# Patient Record
Sex: Male | Born: 1970
Health system: Southern US, Community
[De-identification: ages and names within clinical notes are randomized; demographics above are authoritative.]

## PROBLEM LIST (undated history)

## (undated) DIAGNOSIS — R42 Dizziness and giddiness: Secondary | ICD-10-CM

## (undated) DIAGNOSIS — IMO0001 Reserved for inherently not codable concepts without codable children: Secondary | ICD-10-CM

## (undated) DIAGNOSIS — F329 Major depressive disorder, single episode, unspecified: Secondary | ICD-10-CM

## (undated) DIAGNOSIS — R55 Syncope and collapse: Secondary | ICD-10-CM

## (undated) DIAGNOSIS — R011 Cardiac murmur, unspecified: Secondary | ICD-10-CM

## (undated) DIAGNOSIS — B019 Varicella without complication: Secondary | ICD-10-CM

## (undated) DIAGNOSIS — N200 Calculus of kidney: Secondary | ICD-10-CM

## (undated) DIAGNOSIS — F410 Panic disorder [episodic paroxysmal anxiety] without agoraphobia: Secondary | ICD-10-CM

## (undated) DIAGNOSIS — H9190 Unspecified hearing loss, unspecified ear: Secondary | ICD-10-CM

## (undated) DIAGNOSIS — F32A Depression, unspecified: Secondary | ICD-10-CM

## (undated) DIAGNOSIS — N4340 Spermatocele of epididymis, unspecified: Secondary | ICD-10-CM

## (undated) DIAGNOSIS — I341 Nonrheumatic mitral (valve) prolapse: Secondary | ICD-10-CM

## (undated) DIAGNOSIS — F419 Anxiety disorder, unspecified: Secondary | ICD-10-CM

## (undated) DIAGNOSIS — R000 Tachycardia, unspecified: Secondary | ICD-10-CM

## (undated) DIAGNOSIS — R0609 Other forms of dyspnea: Secondary | ICD-10-CM

## (undated) DIAGNOSIS — E559 Vitamin D deficiency, unspecified: Secondary | ICD-10-CM

## (undated) DIAGNOSIS — D696 Thrombocytopenia, unspecified: Secondary | ICD-10-CM

## (undated) HISTORY — DX: Tachycardia, unspecified: R00.0

## (undated) HISTORY — DX: Vitamin D deficiency, unspecified: E55.9

## (undated) HISTORY — PX: TOOTH EXTRACTION: SUR596

## (undated) HISTORY — DX: Spermatocele of epididymis, unspecified: N43.40

## (undated) HISTORY — DX: Cardiac murmur, unspecified: R01.1

## (undated) HISTORY — DX: Major depressive disorder, single episode, unspecified: F32.9

## (undated) HISTORY — DX: Syncope and collapse: R55

## (undated) HISTORY — DX: Gilbert syndrome: E80.4

## (undated) HISTORY — DX: Panic disorder (episodic paroxysmal anxiety): F41.0

## (undated) HISTORY — DX: Anxiety disorder, unspecified: F41.9

## (undated) HISTORY — DX: Thrombocytopenia, unspecified: D69.6

## (undated) HISTORY — DX: Other forms of dyspnea: R06.09

## (undated) HISTORY — DX: Depression, unspecified: F32.A

## (undated) HISTORY — DX: Unspecified hearing loss, unspecified ear: H91.90

## (undated) HISTORY — DX: Nonrheumatic mitral (valve) prolapse: I34.1

## (undated) HISTORY — DX: Calculus of kidney: N20.0

## (undated) HISTORY — DX: Varicella without complication: B01.9

## (undated) HISTORY — DX: Reserved for inherently not codable concepts without codable children: IMO0001

---
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
## 1995-10-31 HISTORY — PX: SHOULDER SURGERY: SHX246

## 2005-10-30 HISTORY — PX: CHOLECYSTECTOMY: SHX55

## 2011-10-31 DIAGNOSIS — I341 Nonrheumatic mitral (valve) prolapse: Secondary | ICD-10-CM

## 2011-10-31 HISTORY — DX: Nonrheumatic mitral (valve) prolapse: I34.1

## 2012-08-30 DIAGNOSIS — N4340 Spermatocele of epididymis, unspecified: Secondary | ICD-10-CM

## 2012-08-30 HISTORY — DX: Spermatocele of epididymis, unspecified: N43.40

## 2013-10-30 DIAGNOSIS — F410 Panic disorder [episodic paroxysmal anxiety] without agoraphobia: Secondary | ICD-10-CM

## 2013-10-30 HISTORY — DX: Panic disorder (episodic paroxysmal anxiety): F41.0

## 2015-11-10 LAB — TSH: TSH: 0.95 (ref 0.41–5.90)

## 2015-11-10 LAB — HEPATIC FUNCTION PANEL
ALT: 43 — AB (ref 10–40)
AST: 26 (ref 14–40)
Alkaline Phosphatase: 85 (ref 25–125)
Bilirubin, Total: 1.4

## 2015-11-10 LAB — BASIC METABOLIC PANEL
BUN: 19 (ref 4–21)
Creatinine: 0.9 (ref 0.6–1.3)
Glucose: 112
Potassium: 4.2 (ref 3.4–5.3)
SODIUM: 139 (ref 137–147)

## 2015-11-10 LAB — LIPID PANEL
CHOLESTEROL: 163 (ref 0–200)
HDL: 43 (ref 35–70)
LDL CALC: 103
Triglycerides: 87 (ref 40–160)

## 2015-11-10 LAB — CBC AND DIFFERENTIAL
HEMATOCRIT: 45 (ref 41–53)
HEMOGLOBIN: 15.7 (ref 13.5–17.5)
PLATELETS: 211 (ref 150–399)
WBC: 7

## 2015-11-10 LAB — PSA: PSA: 0.46

## 2016-05-05 ENCOUNTER — Ambulatory Visit (INDEPENDENT_AMBULATORY_CARE_PROVIDER_SITE_OTHER): Payer: Managed Care, Other (non HMO) | Admitting: Family Medicine

## 2016-05-05 ENCOUNTER — Encounter: Payer: Self-pay | Admitting: Family Medicine

## 2016-05-05 VITALS — BP 133/91 | HR 90 | Temp 98.1°F | Resp 20 | Ht 74.0 in | Wt 181.8 lb

## 2016-05-05 DIAGNOSIS — F32A Depression, unspecified: Secondary | ICD-10-CM | POA: Insufficient documentation

## 2016-05-05 DIAGNOSIS — F419 Anxiety disorder, unspecified: Secondary | ICD-10-CM

## 2016-05-05 DIAGNOSIS — F329 Major depressive disorder, single episode, unspecified: Secondary | ICD-10-CM

## 2016-05-05 MED ORDER — QUETIAPINE FUMARATE 50 MG PO TABS: ORAL_TABLET | ORAL | Status: DC

## 2016-05-05 NOTE — Progress Notes (Signed)
Patient ID: Chris Robertson, male   DOB: September 07, 1971, 45 y.o.   MRN: [id number]      Patient ID: Chris Robertson, male  DOB: 1971/10/20, 45 y.o.   MRN: [id number] Patient Care Team    Relationship Specialty Notifications Start End  Ma Hillock, DO PCP - General Family Medicine  05/05/16     Subjective:  Chris Robertson is a 45 y.o.  male present for new patient establishment with acute complaint.  All past medical history, surgical history, allergies, family history, immunizations, medications and social history were obtained/entered in the electronic medical record today. All recent labs, ED visits and hospitalizations within the last year were reviewed.  Panic/anxiety/depression: pt states he has a strong family history of mental disease. He was tried on multiple medications over the course of a year with his prior PCP.  With all medications he experienced a side effect he found intollerable. He reports over the weekend he suffered from a panic attack.  He experienced a feeling of being overwhelmed, tearful and sad. He has recently needed to move to [place] from [place] secondary to financial reasons. He and his mother have moved in with his sister. He reports he is the only one in his family that takes care of his mom, which has a chronic illness. He became overwhelmed this weekend when he felt he many tasks to completed around the house, errands to run and care of his mother and he was not getting any help. He states he works at [employer] does not make much money. He would be interested in attempting another medication and therapy, but has concerns over financial cost.  Meds prior: prozac (GERD-like symptoms), zoloft and two others he can not remember caused sleeping issues and night sweats. Patient was seen in the ED (in [place]) about a year ago for anxiety, treated and released (no overnight admission). After that occurrence is when he had been tried on above listed medications. Prior to that  event he had not been treated for anxiety.    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  GAD 7 : Generalized Anxiety Score 05/05/2016  Nervous, Anxious, on Edge 1  Control/stop worrying 1  Worry too much - different things 1  Trouble relaxing 2  Restless 2  Easily annoyed or irritable 1  Afraid - awful might happen 0  Total GAD 7 Score 8  Anxiety Difficulty Somewhat difficult   Mood disorder: negative, only secondary to #2 --> no. 7 yes in #1, and #3 with moderate   Immunization History  Administered Date(s) Administered  . Influenza-Unspecified 07/31/2015  . Tdap 10/30/2013     Past Medical History  Diagnosis Date  . Anxiety   . Depression   . Heart murmur     since childhood  . Chicken pox   . Hearing impaired     age 6  . Panic disorder 2015  . Spermatocele    No Known Allergies Past Surgical History  Procedure Laterality Date  . Cholecystectomy  2007  . Shoulder surgery  1997  . Pectus carnatum repair  1989   Family History  Problem Relation Age of Onset  . Mental illness Mother   .  Mental illness Father   . Mental illness Sister   . Mental illness Brother   . Thyroid cancer Maternal Aunt   . Mental illness Maternal Aunt   . Mental illness Maternal Uncle   . Mental illness Paternal Aunt   . Mental illness Paternal Uncle   . Mental illness Maternal Grandmother   . Pancreatic cancer Maternal Grandfather   . Mental illness Maternal Grandfather   . Mental illness Paternal Grandmother   . Mental illness Paternal Grandfather    Social History   Social History  . Marital Status: Single    Spouse Name: N/A    . Number of Children: N/A  . Years of Education: N/A   Occupational History  . Not on file.   Social History Main Topics  . Smoking status: Never Smoker   . Smokeless tobacco: Not on file  . Alcohol Use: 1.2 oz/week    2 Glasses of wine per week  . Drug Use: No  . Sexual Activity: No   Other Topics Concern  . Not on file   Social History Narrative   Single. No children.    Associates degree. Works full time as Financial trader for [employer].   No tobacco, drug use. Occasional ETOH.    Drink caffeine    Wears seatbelt, bike helmet   Bilateral hearing aids   Smoke detector in the home.    Feels safe in relationships.         Medication List       This list is accurate as of: 05/05/16 12:12 PM.  Always use your most recent med list.               QUEtiapine 50 MG tablet  Commonly known as:  SEROQUEL  50 mg qhs for 5 days, then 100 mg qhs         No results found for this or any previous visit (from the past 2160 hour(s)).   ROS: 14 pt review of systems performed and negative (unless mentioned in an HPI)  Objective: BP 133/91 mmHg  Pulse 90  Temp(Src) 98.1 F (36.7 C)  Resp 20  Ht 6' 2"  (1.88 m)  Wt 181 lb 12.8 oz (82.464 kg)  BMI 23.33 kg/m2  SpO2 96% Gen: Afebrile. No acute distress. Nontoxic in appearance, well-developed, well-nourished,  Caucasian male. HOH. HENT: AT. [place]. MMM, no oral lesions. Eyes:Pupils Equal Round Reactive to light, Extraocular movements intact,  Conjunctiva without redness, discharge or icterus. Neck/lymp/endocrine: Supple,no lymphadenopathy, no thyromegaly CV: RRR 2/6 murmur present (radiation to back), no edema, +2/4 P  Chest: CTAB, no wheeze, rhonchi or crackles.  Abd: Soft. NTND. BS present.  Skin: Warm and well-perfused. Skin intact. Neuro/Msk: Normal gait. PERLA. EOMi. Alert. Oriented x3.   Psych: Anxious, otherwise Normal affect, dress and demeanor. Normal speech. Normal thought content and  judgment.  Assessment/plan: Chris Robertson is a 45 y.o. male present for new pt establishment with acute complaint.  Anxiety disorder, unspecified anxiety disorder type - long discussion concerning treatment considerations. Trial of seroquel, taper to 100 mg QHS, follow up in 4 weeks.  - Therapy: pt to investigate outpatient therapy. Will also provide resources information on clinic in the area.  - QUEtiapine (SEROQUEL) 50 MG tablet; 50 mg qhs for 5 days, then 100 mg qhs  Dispense: 60 tablet; Refill: 1  Greater than 45 minutes was spent with patient, greater than 50% of that time was spent face-to-face with patient counseling and coordinating care.  Electronically  signed by: Howard Pouch, DO [place]

## 2016-05-05 NOTE — Patient Instructions (Signed)
It was a pleasure meeting you today.  I have called in a medicine for you to start 1-2 hours prior to bed. You will take 1 pill for 5 days, then increase to 2 pills.  F/U 3-4 weeks.

## 2016-05-12 ENCOUNTER — Encounter: Payer: Self-pay | Admitting: Family Medicine

## 2016-05-12 DIAGNOSIS — N433 Hydrocele, unspecified: Secondary | ICD-10-CM | POA: Insufficient documentation

## 2016-05-23 DIAGNOSIS — H905 Unspecified sensorineural hearing loss: Secondary | ICD-10-CM | POA: Insufficient documentation

## 2016-05-25 ENCOUNTER — Encounter: Payer: Self-pay | Admitting: Family Medicine

## 2016-05-30 ENCOUNTER — Ambulatory Visit (INDEPENDENT_AMBULATORY_CARE_PROVIDER_SITE_OTHER): Payer: Managed Care, Other (non HMO) | Admitting: Family Medicine

## 2016-05-30 ENCOUNTER — Encounter: Payer: Self-pay | Admitting: Family Medicine

## 2016-05-30 DIAGNOSIS — F419 Anxiety disorder, unspecified: Secondary | ICD-10-CM | POA: Diagnosis not present

## 2016-05-30 MED ORDER — QUETIAPINE FUMARATE 100 MG PO TABS: 100.0000 mg | ORAL_TABLET | Freq: Every day | ORAL | 1 refills | Status: DC

## 2016-05-30 NOTE — Progress Notes (Signed)
Patient ID: Chris Robertson, male   DOB: 02-06-1971, 45 y.o.   MRN: [id number]      Patient ID: Chris Robertson, male  DOB: 04-12-1971, 45 y.o.   MRN: [id number] Patient Care Team    Relationship Specialty Notifications Start End  Ma Hillock, DO PCP - General Family Medicine  05/05/16     Subjective:  Qu[place] Chris Robertson is a 45 y.o.  male present for follow up on anxiety and depression.   Panic/anxiety/depression: Pt was started on Seroquel taper ~4 weeks, and is now taking 100 mg QHS. He feels more relaxed and in better control of anxiety. He is sleeping better, but still not a full night sleep. He does not feel any side effects to the medications. He would like to stay on the 100 mg dose.   Prior note: pt states he has a strong family history of mental disease. He was tried on multiple medications over the course of a year with his prior PCP.  With all medications he experienced a side effect he found intollerable. He reports over the weekend he suffered from a panic attack.  He experienced a feeling of being overwhelmed, tearful and sad. He has recently needed to move to [place] from [place] secondary to financial reasons. He and his mother have moved in with his sister. He reports he is the only one in his family that takes care of his mom, which has a chronic illness. He became overwhelmed this weekend when he felt he many tasks to completed around the house, errands to run and care of his mother and he was not getting any help. He states he works at [employer] does not make much money. He would be interested in attempting another medication and therapy, but has concerns over financial cost.  Meds prior: prozac (GERD-like symptoms), zoloft and two others he can not remember caused sleeping issues and night sweats. Patient was seen in the ED (in [place]) about a year ago for anxiety, treated and released (no overnight admission). After that occurrence is when he had been tried on above listed  medications. Prior to that event he had not been treated for anxiety.   GAD 7 : Generalized Anxiety Score 05/05/2016  Nervous, Anxious, on Edge 1  Control/stop worrying 1  Worry too much - different things 1  Trouble relaxing 2  Restless 2  Easily annoyed or irritable 1  Afraid - awful might happen 0  Total GAD 7 Score 8  Anxiety Difficulty Somewhat difficult   Mood disorder: negative, only secondary to #2 --> no. 7 yes in #1, and #3 with moderate   Immunization History  Administered Date(s) Administered  . Influenza-Unspecified 07/31/2015  . Tdap 10/30/2013     Past Medical History:  Diagnosis Date  . Anxiety   . Aortic regurgitation   . Chicken pox   . Depression   . Gilbert disease   . Hearing impaired    age 49  . Heart murmur    since childhood  . Mitral regurgitation   . MVP (mitral valve prolapse)   . Panic disorder 2015  . Spermatocele   . Thrombocytopenia (HCC)    mild, chronic  . Vitamin D deficiency    No Known Allergies Past Surgical History:  Procedure Laterality Date  . CHOLECYSTECTOMY  2007  . [place]  . SHOULDER SURGERY  1997   Family History  Problem Relation Age of Onset  . Mental illness Mother   .  Mental illness Father   . Mental illness Sister   . Mental illness Brother   . Thyroid cancer Maternal Aunt   . Mental illness Maternal Aunt   . Mental illness Maternal Uncle   . Mental illness Paternal Aunt   . Mental illness Paternal Uncle   . Mental illness Maternal Grandmother   . Pancreatic cancer Maternal Grandfather   . Mental illness Maternal Grandfather   . Mental illness Paternal Grandmother   . Mental illness Paternal Grandfather    Social History   Social History  . Marital status: Single    Spouse name: N/A  . Number of children: N/A  . Years of education: N/A   Occupational History  . Not on file.   Social History Main Topics  . Smoking status: Never Smoker  . Smokeless tobacco: Never Used  .  Alcohol use 1.2 oz/week    2 Glasses of wine per week  . Drug use: No  . Sexual activity: No   Other Topics Concern  . Not on file   Social History Narrative   Single. No children.    Associates degree. Works full time as Financial trader for [employer].   No tobacco, drug use. Occasional ETOH.    Drink caffeine    Wears seatbelt, bike helmet   Bilateral hearing aids   Smoke detector in the home.    Feels safe in relationships.         Medication List       Accurate as of 05/30/16 10:36 AM. Always use your most recent med list.          QUEtiapine 50 MG tablet Commonly known as:  SEROQUEL 50 mg qhs for 5 days, then 100 mg qhs        No results found for this or any previous visit (from the past 2160 hour(s)).   ROS: 14 pt review of systems performed and negative (unless mentioned in an HPI)  Objective: BP 117/79 (BP Location: Right Arm, Patient Position: Sitting, Cuff Size: Normal)   Pulse 89   Temp 97.9 F (36.6 C) (Oral)   Resp 20   Ht 6' 2"  (1.88 m)   Wt 178 lb 4 oz (80.9 kg)   SpO2 97%   BMI 22.89 kg/m  Gen: Afebrile. No acute distress. Nontoxic in appearance, well-developed, well-nourished,  Caucasian male. HOH.   Psych: Normal affect, dress and demeanor. Normal speech. Normal thought content and judgment.  Assessment/plan: Chris Robertson is a 45 y.o. male present for new pt establishment with acute complaint.  Anxiety disorder, unspecified anxiety disorder type - doing rather well on medication - refilled Seroquel 100 mg qhs.  - f/U 3 months.    Electronically signed by: Howard Pouch, DO [place]

## 2016-05-30 NOTE — Patient Instructions (Signed)
I am glad you are doing well on medication.  Continue 100 mg (1 tab now), before bed. 90 day refills called in.  I will see you in 3 months (sooner if needed).

## 2016-05-31 ENCOUNTER — Encounter: Payer: Self-pay | Admitting: Family Medicine

## 2016-06-05 ENCOUNTER — Encounter: Payer: Self-pay | Admitting: Family Medicine

## 2016-07-18 ENCOUNTER — Encounter: Payer: Self-pay | Admitting: Family Medicine

## 2016-08-30 ENCOUNTER — Ambulatory Visit: Payer: Managed Care, Other (non HMO) | Admitting: Family Medicine

## 2016-10-02 ENCOUNTER — Encounter: Payer: Self-pay | Admitting: Family Medicine

## 2016-10-13 ENCOUNTER — Ambulatory Visit (INDEPENDENT_AMBULATORY_CARE_PROVIDER_SITE_OTHER): Payer: Self-pay

## 2016-10-13 DIAGNOSIS — Z23 Encounter for immunization: Secondary | ICD-10-CM

## 2016-11-08 ENCOUNTER — Encounter: Payer: Self-pay | Admitting: Family Medicine

## 2016-12-11 ENCOUNTER — Other Ambulatory Visit: Payer: Self-pay | Admitting: Family Medicine

## 2016-12-11 DIAGNOSIS — F419 Anxiety disorder, unspecified: Secondary | ICD-10-CM

## 2016-12-12 ENCOUNTER — Encounter: Payer: Self-pay | Admitting: *Deleted

## 2016-12-12 NOTE — Telephone Encounter (Signed)
seroquel refilled 30 day supply patient needs office visit prior to any more refills. Sent message to patient in My Chart.

## 2016-12-20 ENCOUNTER — Ambulatory Visit (INDEPENDENT_AMBULATORY_CARE_PROVIDER_SITE_OTHER): Payer: BLUE CROSS/BLUE SHIELD | Admitting: Family Medicine

## 2016-12-20 ENCOUNTER — Encounter: Payer: Self-pay | Admitting: Family Medicine

## 2016-12-20 VITALS — BP 131/87 | HR 73 | Temp 98.4°F | Resp 20 | Ht 74.0 in | Wt 178.0 lb

## 2016-12-20 DIAGNOSIS — F339 Major depressive disorder, recurrent, unspecified: Secondary | ICD-10-CM | POA: Diagnosis not present

## 2016-12-20 DIAGNOSIS — F4001 Agoraphobia with panic disorder: Secondary | ICD-10-CM

## 2016-12-20 MED ORDER — QUETIAPINE FUMARATE 100 MG PO TABS: 100.0000 mg | ORAL_TABLET | Freq: Every day | ORAL | 1 refills | Status: DC

## 2016-12-20 NOTE — Patient Instructions (Signed)
You look really good.  Try to join a group (of whatever you have interest). Try to call the [facility] they do offer some discounted to free counseling, and you may qualify.    Follow every 6 months

## 2016-12-20 NOTE — Progress Notes (Signed)
Patient ID: Chris Robertson, male   DOB: 12-30-1970, 46 y.o.   MRN: [id number]      Patient ID: Chris Robertson, male  DOB: March 31, 1971, 46 y.o.   MRN: [id number] Patient Care Team    Relationship Specialty Notifications Start End  Ma Hillock, DO PCP - General Family Medicine  05/05/16     Subjective:  Chris Robertson is a 46 y.o.  male present for follow up on anxiety and depression.   Panic/anxiety/depression: Pt reports he is doing well on the Seroquel 100 mg QHS. He does notice a little difficulty waking up and getting going in the mornings since he has to get up for work at [employer]. He takes the medicine at 9 pm at night. He still has had issues being more social. He reports he is willing but financially unable to seek counseling. He had once smaller panic attack over the weekend, but his mother was ill and having issues which gave him increased anxiety.   Prior note:  Pt was started on Seroquel taper ~4 weeks, and is now taking 100 mg QHS. He feels more relaxed and in better control of anxiety. He is sleeping better, but still not a full night sleep. He does not feel any side effects to the medications. He would like to stay on the 100 mg dose.   Prior note: pt states he has a strong family history of mental disease. He was tried on multiple medications over the course of a year with his prior PCP.  With all medications he experienced a side effect he found intollerable. He reports over the weekend he suffered from a panic attack.  He experienced a feeling of being overwhelmed, tearful and sad. He has recently needed to move to [place] from [place] secondary to financial reasons. He and his mother have moved in with his sister. He reports he is the only one in his family that takes care of his mom, which has a chronic illness. He became overwhelmed this weekend when he felt he many tasks to completed around the house, errands to run and care of his mother and he was not getting any help. He states he  works at [employer] does not make much money. He would be interested in attempting another medication and therapy, but has concerns over financial cost.  Meds prior: prozac (GERD-like symptoms), zoloft and two others he can not remember caused sleeping issues and night sweats. Patient was seen in the ED (in [place]) about a year ago for anxiety, treated and released (no overnight admission). After that occurrence is when he had been tried on above listed medications. Prior to that event he had not been treated for anxiety.   GAD 7 : Generalized Anxiety Score 05/05/2016  Nervous, Anxious, on Edge 1  Control/stop worrying 1  Worry too much - different things 1  Trouble relaxing 2  Restless 2  Easily annoyed or irritable 1  Afraid - awful might happen 0  Total GAD 7 Score 8  Anxiety Difficulty Somewhat difficult   Mood disorder: negative, only secondary to #2 --> no. 7 yes in #1, and #3 with moderate   Immunization History  Administered Date(s) Administered  . Influenza,inj,Quad PF,36+ Mos 10/13/2016  . Influenza-Unspecified 07/31/2015  . Tdap 10/30/2013     Past Medical History:  Diagnosis Date  . Anxiety   . Aortic regurgitation   . Chicken pox   . Depression   . Gilbert disease   . Hearing impaired  age 75  . Heart murmur    since childhood  . Mitral regurgitation   . MVP (mitral valve prolapse)   . Panic disorder 2015  . Spermatocele   . Thrombocytopenia (HCC)    mild, chronic  . Vitamin D deficiency    No Known Allergies Past Surgical History:  Procedure Laterality Date  . CHOLECYSTECTOMY  2007  . [place]  . SHOULDER SURGERY  1997   Family History  Problem Relation Age of Onset  . Mental illness Mother   . Mental illness Father   . Mental illness Sister   . Mental illness Brother   . Thyroid cancer Maternal Aunt   . Mental illness Maternal Aunt   . Mental illness Maternal Uncle   . Mental illness Paternal Aunt   . Mental illness  Paternal Uncle   . Mental illness Maternal Grandmother   . Pancreatic cancer Maternal Grandfather   . Mental illness Maternal Grandfather   . Mental illness Paternal Grandmother   . Mental illness Paternal Grandfather    Social History   Social History  . Marital status: Single    Spouse name: N/A  . Number of children: N/A  . Years of education: N/A   Occupational History  . Not on file.   Social History Main Topics  . Smoking status: Never Smoker  . Smokeless tobacco: Never Used  . Alcohol use 1.2 oz/week    2 Glasses of wine per week  . Drug use: No  . Sexual activity: No   Other Topics Concern  . Not on file   Social History Narrative   Single. No children.    Associates degree. Works full time as Financial trader for [employer].   No tobacco, drug use. Occasional ETOH.    Drink caffeine    Wears seatbelt, bike helmet   Bilateral hearing aids   Smoke detector in the home.    Feels safe in relationships.       Allergies as of 12/20/2016   No Known Allergies     Medication List       Accurate as of 12/20/16  3:47 PM. Always use your most recent med list.          QUEtiapine 100 MG tablet Commonly known as:  SEROQUEL TAKE 1 TABLET BY MOUTH AT BEDTIME   Vitamin D3 1000 units Caps Take 2,000 Int'l Units by mouth daily.        No results found for this or any previous visit (from the past 2160 hour(s)).   ROS: 14 pt review of systems performed and negative (unless mentioned in an HPI)  Objective: BP 131/87 (BP Location: Left Arm, Patient Position: Sitting, Cuff Size: Large)   Pulse 73   Temp 98.4 F (36.9 C)   Resp 20   Ht 6' 2"  (1.88 m)   Wt 178 lb (80.7 kg)   SpO2 98%   BMI 22.85 kg/m  Gen: Afebrile. No acute distress. Very pleasant caucasian male.  HENT: AT. [place].  MMM.  Eyes:Pupils Equal Round Reactive to light, Extraocular movements intact,  Conjunctiva without redness, discharge or icterus. CV: RRR Chest: CTAB, no wheeze or crackles Psych:  Normal affect, dress and demeanor. Normal speech. Normal thought content and judgment.  Assessment/plan: Chris Robertson is a 46 y.o. male present for new pt establishment with acute complaint.  Anxiety disorder, unspecified anxiety disorder type - stable today. recommended her try to take the medication at 8 pm, and then  continue to back up the time until he finds a happy medium between onset/bedtime and waking more refreshed.  - pamphlet for [facility] to call to see if they have any free services/thrapoy sessions he can join to help him socialize.  - refilled Seroquel 100 mg qhs.  - f/U 6 months.    Electronically signed by: Howard Pouch, DO [place]

## 2017-02-25 ENCOUNTER — Encounter: Payer: Self-pay | Admitting: Family Medicine

## 2017-03-05 ENCOUNTER — Ambulatory Visit (INDEPENDENT_AMBULATORY_CARE_PROVIDER_SITE_OTHER): Payer: BLUE CROSS/BLUE SHIELD | Admitting: Internal Medicine

## 2017-03-05 ENCOUNTER — Encounter: Payer: Self-pay | Admitting: Internal Medicine

## 2017-03-05 VITALS — BP 120/64 | HR 84 | Temp 98.1°F | Wt 185.0 lb

## 2017-03-05 DIAGNOSIS — W57XXXA Bitten or stung by nonvenomous insect and other nonvenomous arthropods, initial encounter: Secondary | ICD-10-CM

## 2017-03-05 DIAGNOSIS — L03115 Cellulitis of right lower limb: Secondary | ICD-10-CM

## 2017-03-05 DIAGNOSIS — S70361A Insect bite (nonvenomous), right thigh, initial encounter: Secondary | ICD-10-CM

## 2017-03-05 MED ORDER — DOXYCYCLINE HYCLATE 100 MG PO TABS: 100.0000 mg | ORAL_TABLET | Freq: Two times a day (BID) | ORAL | 0 refills | Status: DC

## 2017-03-05 NOTE — Progress Notes (Signed)
Pre visit review using our clinic review tool, if applicable. No additional management support is needed unless otherwise documented below in the visit note. 

## 2017-03-05 NOTE — Patient Instructions (Signed)
Cellulitis, Adult Cellulitis is a skin infection. The infected area is usually red and sore. This condition occurs most often in the arms and lower legs. It is very important to get treated for this condition. Follow these instructions at home:  Take over-the-counter and prescription medicines only as told by your doctor.  If you were prescribed an antibiotic medicine, take it as told by your doctor. Do not stop taking the antibiotic even if you start to feel better.  Drink enough fluid to keep your pee (urine) clear or pale yellow.  Do not touch or rub the infected area.  Raise (elevate) the infected area above the level of your heart while you are sitting or lying down.  Place warm or cold wet cloths (warm or cold compresses) on the infected area. Do this as told by your doctor.  Keep all follow-up visits as told by your doctor. This is important. These visits let your doctor make sure your infection is not getting worse. Contact a doctor if:  You have a fever.  Your symptoms do not get better after 1-2 days of treatment.  Your bone or joint under the infected area starts to hurt after the skin has healed.  Your infection comes back. This can happen in the same area or another area.  You have a swollen bump in the infected area.  You have new symptoms.  You feel ill and also have muscle aches and pains. Get help right away if:  Your symptoms get worse.  You feel very sleepy.  You throw up (vomit) or have watery poop (diarrhea) for a long time.  There are red streaks coming from the infected area.  Your red area gets larger.  Your red area turns darker. This information is not intended to replace advice given to you by your health care provider. Make sure you discuss any questions you have with your health care provider. Document Released: 04/03/2008 Document Revised: 03/23/2016 Document Reviewed: 08/25/2015 Elsevier Interactive Patient Education  2017 [employer].

## 2017-03-05 NOTE — Progress Notes (Signed)
Subjective:    Patient ID: Chris Robertson, male    DOB: 01-06-1971, 46 y.o.   MRN: [id number]  HPI  Pt presents to the clinic today with c/o a tick bite of his right leg. He pulled it off last Wednesday. He thinks it was on there < 24 hours. The tick was not engorged. Since pulling it off, he has noticed redness and swelling around the area. The area is itchy and not painful. He denies fever, chills or body aches. He has put Neosporin on the area, but has not noticed that it made any difference.  Review of Systems      Past Medical History:  Diagnosis Date  . Anxiety   . Aortic regurgitation   . Chicken pox   . Depression   . Gilbert disease   . Hearing impaired    age 32  . Heart murmur    since childhood  . Mitral regurgitation   . MVP (mitral valve prolapse)   . Panic disorder 2015  . Spermatocele   . Thrombocytopenia (HCC)    mild, chronic  . Vitamin D deficiency     Current Outpatient Prescriptions  Medication Sig Dispense Refill  . Cholecalciferol (VITAMIN D3) 1000 units CAPS Take 2,000 Int'l Units by mouth daily.    . QUEtiapine (SEROQUEL) 100 MG tablet Take 1 tablet (100 mg total) by mouth at bedtime. 90 tablet 1   No current facility-administered medications for this visit.     No Known Allergies  Family History  Problem Relation Age of Onset  . Mental illness Mother   . Mental illness Father   . Mental illness Sister   . Mental illness Brother   . Thyroid cancer Maternal Aunt   . Mental illness Maternal Aunt   . Mental illness Maternal Uncle   . Mental illness Paternal Aunt   . Mental illness Paternal Uncle   . Mental illness Maternal Grandmother   . Pancreatic cancer Maternal Grandfather   . Mental illness Maternal Grandfather   . Mental illness Paternal Grandmother   . Mental illness Paternal Grandfather     Social History   Social History  . Marital status: Single    Spouse name: N/A  . Number of children: N/A  . Years of education: N/A    Occupational History  . Not on file.   Social History Main Topics  . Smoking status: Never Smoker  . Smokeless tobacco: Never Used  . Alcohol use 1.2 oz/week    2 Glasses of wine per week  . Drug use: No  . Sexual activity: No   Other Topics Concern  . Not on file   Social History Narrative   Single. No children.    Associates degree. Works full time as Teacher, early years/pre for [employer].   No tobacco, drug use. Occasional ETOH.    Drink caffeine    Wears seatbelt, bike helmet   Bilateral hearing aids   Smoke detector in the home.    Feels safe in relationships.         Constitutional: Denies fever, malaise, fatigue, headache or abrupt weight changes.  Skin: Pt reports tick bite of right thigh. Denies ulcercations.    No other specific complaints in a complete review of systems (except as listed in HPI above).  Objective:   Physical Exam   BP 120/64 (BP Location: Left Arm, Patient Position: Sitting)   Pulse 84   Temp 98.1 F (36.7 C) (Oral)   Wt 185 lb (  83.9 kg)   SpO2 98%   BMI 23.75 kg/m  Wt Readings from Last 3 Encounters:  03/05/17 185 lb (83.9 kg)  12/20/16 178 lb (80.7 kg)  05/30/16 178 lb 4 oz (80.9 kg)    General: Appears his stated age, in NAD. Skin: 2 cm oval area of cellulitis noted of right lateral thigh.    Assessment & Plan:   Cellulitis of Right Thigh secondary to Tick Bite:  eRx for Doxycycline 100 mg BID x 10 days Can continue Neosporin or try Hydrocortisone for itching Warm compresses TID  RTC as needed or if symptoms persist or worsen Elizah Mierzwa, NP

## 2017-06-19 ENCOUNTER — Encounter: Payer: Self-pay | Admitting: Family Medicine

## 2017-06-19 ENCOUNTER — Ambulatory Visit (INDEPENDENT_AMBULATORY_CARE_PROVIDER_SITE_OTHER): Payer: BLUE CROSS/BLUE SHIELD | Admitting: Family Medicine

## 2017-06-19 VITALS — BP 127/82 | HR 78 | Temp 98.3°F | Resp 20 | Ht 74.0 in | Wt 184.2 lb

## 2017-06-19 DIAGNOSIS — F339 Major depressive disorder, recurrent, unspecified: Secondary | ICD-10-CM

## 2017-06-19 DIAGNOSIS — F4001 Agoraphobia with panic disorder: Secondary | ICD-10-CM

## 2017-06-19 MED ORDER — QUETIAPINE FUMARATE 100 MG PO TABS: 100.0000 mg | ORAL_TABLET | Freq: Every day | ORAL | 1 refills | Status: DC

## 2017-06-19 NOTE — Progress Notes (Signed)
Patient ID: Jareth Pardee, male   DOB: [birth year], 46 y.o.   MRN: [id number]      Patient ID: Partick Musselman, male  DOB: 20-Jan-1971, 46 y.o.   MRN: [id number] Patient Care Team    Relationship Specialty Notifications Start End  Ma Hillock, DO PCP - General Family Medicine  05/05/16     Subjective:  Kalieb Freeland is a 46 y.o.  male present for follow up on anxiety and depression.   Panic/anxiety/depression:  Patient presents for follow-up on his anxiety and depression today. He states he is doing well on the Seroquel 100 mg daily at bedtime. He is taking this medication around 7:53 PM at night. He still having some difficulty falling asleep, sometimes not able to fall asleep until 11 PM. He does have to get up early for his job at 5 AM, so he has concerns about any additional medications. Overall he feels he is doing well on this medication. He is attempting become more social, and exercise more. He just finished some summer classes, and is registered for fall classes to further his education. He still works full-time and takes care of his mother as well.   Depression screen [facility] 2/9 06/19/2017 05/05/2016 05/05/2016  Decreased Interest 1 1 2   Down, Depressed, Hopeless 1 1 2   PHQ - 2 Score 2 2 4   Altered sleeping 2 - 2  Tired, decreased energy 1 - 2  Change in appetite 0 - 0  Feeling bad or failure about yourself  0 - 0  Trouble concentrating 1 - 0  Moving slowly or fidgety/restless 1 - 0  Suicidal thoughts 0 - 0  PHQ-9 Score 7 - 8  Difficult doing work/chores Somewhat difficult - Somewhat difficult     GAD 7 : Generalized Anxiety Score 06/19/2017 05/05/2016  Nervous, Anxious, on Edge 1 1  Control/stop worrying 1 1  Worry too much - different things 1 1  Trouble relaxing 2 2  Restless 2 2  Easily annoyed or irritable 1 1  Afraid - awful might happen 0 0  Total GAD 7 Score 8 8  Anxiety Difficulty Somewhat difficult Somewhat difficult   Mood disorder: negative, only secondary to #2 -->  no. 7 yes in #1, and #3 with moderate   Immunization History  Administered Date(s) Administered  . Influenza,inj,Quad PF,6+ Mos 10/13/2016  . Influenza-Unspecified 07/31/2015  . Tdap 10/30/2013     Past Medical History:  Diagnosis Date  . Anxiety   . Aortic regurgitation   . Chicken pox   . Depression   . Gilbert disease   . Hearing impaired    age 39  . Heart murmur    since childhood  . Mitral regurgitation   . MVP (mitral valve prolapse)   . Panic disorder 2015  . Spermatocele   . Thrombocytopenia (HCC)    mild, chronic  . Vitamin D deficiency    No Known Allergies Past Surgical History:  Procedure Laterality Date  . CHOLECYSTECTOMY  2007  . [place]  . SHOULDER SURGERY  1997   Family History  Problem Relation Age of Onset  . Mental illness Mother   . Mental illness Father   . Mental illness Sister   . Mental illness Brother   . Thyroid cancer Maternal Aunt   . Mental illness Maternal Aunt   . Mental illness Maternal Uncle   . Mental illness Paternal Aunt   . Mental illness Paternal Uncle   . Mental illness  Maternal Grandmother   . Pancreatic cancer Maternal Grandfather   . Mental illness Maternal Grandfather   . Mental illness Paternal Grandmother   . Mental illness Paternal Grandfather    Social History   Social History  . Marital status: Single    Spouse name: N/A  . Number of children: N/A  . Years of education: N/A   Occupational History  . Not on file.   Social History Main Topics  . Smoking status: Never Smoker  . Smokeless tobacco: Never Used  . Alcohol use 1.2 oz/week    2 Glasses of wine per week  . Drug use: No  . Sexual activity: No   Other Topics Concern  . Not on file   Social History Narrative   Single. No children.    Associates degree. Works full time as Financial trader for [employer].   No tobacco, drug use. Occasional ETOH.    Drink caffeine    Wears seatbelt, bike helmet   Bilateral hearing aids    Smoke detector in the home.    Feels safe in relationships.       Allergies as of 06/19/2017   No Known Allergies     Medication List       Accurate as of 06/19/17 11:22 AM. Always use your most recent med list.          QUEtiapine 100 MG tablet Commonly known as:  SEROQUEL Take 1 tablet (100 mg total) by mouth at bedtime.   Vitamin D3 1000 units Caps Take 2,000 Int'l Units by mouth daily.        No results found for this or any previous visit (from the past 2160 hour(s)).   ROS: 14 pt review of systems performed and negative (unless mentioned in an HPI)  Objective: BP 127/82 (BP Location: Right Arm, Patient Position: Sitting, Cuff Size: Normal)   Pulse 78   Temp 98.3 F (36.8 C)   Resp 20   Ht 6' 2"  (1.88 m)   Wt 184 lb 4 oz (83.6 kg)   SpO2 100%   BMI 23.66 kg/m  Gen: Afebrile. No acute distress. Very pleasant Caucasian male. HENT: AT. [place].  MMM.  Eyes:Pupils Equal Round Reactive to light, Extraocular movements intact,  Conjunctiva without redness, discharge or icterus. Psych: Normal affect, dress and demeanor. Normal speech. Normal thought content and judgment.  Assessment/plan: Mary Hockey is a 46 y.o. male present for new pt establishment with acute complaint.  Depression/Anxiety disorder, unspecified anxiety disorder type - stable today, doing rather well. Refills on Seroquel 100 mg daily at bedtime for 6 months.  - Encouraged him to try to get out and exercise more, this will help with depression and anxiety as well. Also may help him fall asleep quicker at night. - He is working on becoming more social, trying to get himself out into the dating scene. - Follow-up 6 months, sooner if needed.  Electronically signed by: Howard Pouch, DO [place]

## 2017-06-19 NOTE — Patient Instructions (Signed)
You are doing well.  I have refilled your medications today for you.  Schedule your physical in about 6 months and as long as doing well will refill your medications at that time as well.    Please help [place] help you:  We are honored you have chosen [facility] for your Primary Care home. Below you will find basic instructions that you may need to access in the future. Please help [place] help you by reading the instructions, which cover many of the frequent questions we experience.   Prescription refills and request:  -In order to allow more efficient response time, please call your pharmacy for all refills. They will forward the request electronically to [place]. This allows for the quickest possible response. Request left on a nurse line can take longer to refill, since these are checked as time allows between office patients and other phone calls.  - refill request can take up to 3-5 working days to complete.  - If request is sent electronically and request is appropiate, it is usually completed in 1-2 business days.  - all patients will need to be seen routinely for all chronic medical conditions requiring prescription medications (see follow-up below). If you are overdue for follow up on your condition, you will be asked to make an appointment and we will call in enough medication to cover you until your appointment (up to 30 days).  - all controlled substances will require a face to face visit to request/refill.  - if you desire your prescriptions to go through a new pharmacy, and have an active script at original pharmacy, you will need to call your pharmacy and have scripts transferred to new pharmacy. This is completed between the pharmacy locations and not by your provider.    Results: If any images or labs were ordered, it can take up to 1 week to get results depending on the test ordered and the lab/facility running and resulting the test. - Normal or stable results, which do not need  further discussion, may be released to your mychart immediately with attached note to you. A call may not be generated for normal results. Please make certain to sign up for mychart. If you have questions on how to activate your mychart you can call the front office.  - If your results need further discussion, our office will attempt to contact you via phone, and if unable to reach you after 2 attempts, we will release your abnormal result to your mychart with instructions.  - All results will be automatically released in mychart after 1 week.  - Your provider will provide you with explanation and instruction on all relevant material in your results. Please keep in mind, results and labs may appear confusing or abnormal to the untrained eye, but it does not mean they are actually abnormal for you personally. If you have any questions about your results that are not covered, or you desire more detailed explanation than what was provided, you should make an appointment with your provider to do so.   Our office handles many outgoing and incoming calls daily. If we have not contacted you within 1 week about your results, please check your mychart to see if there is a message first and if not, then contact our office.  In helping with this matter, you help decrease call volume, and therefore allow [place] to be able to respond to patients needs more efficiently.   Acute office visits (sick visit):  An acute  visit is intended for a new problem and are scheduled in shorter time slots to allow schedule openings for patients with new problems. This is the appropriate visit to discuss a new problem. In order to provide you with excellent quality medical care with proper time for you to explain your problem, have an exam and receive treatment with instructions, these appointments should be limited to one new problem per visit. If you experience a new problem, in which you desire to be addressed, please make an acute office  visit, we save openings on the schedule to accommodate you. Please do not save your new problem for any other type of visit, let us take care of it properly and quickly for you.   Follow up visits:  Depending on your condition(s) your provider will need to see you routinely in order to provide you with quality care and prescribe medication(s). Most chronic conditions (Example: hypertension, Diabetes, depression/anxiety... etc), require visits a couple times a year. Your provider will instruct you on proper follow up for your personal medical conditions and history. Please make certain to make follow up appointments for your condition as instructed. Failing to do so could result in lapse in your medication treatment/refills. If you request a refill, and are overdue to be seen on a condition, we will always provide you with a 30 day script (once) to allow you time to schedule.    Medicare wellness (well visit): - we have a wonderful Nurse Maudie Mercury), that will meet with you and provide you will yearly medicare wellness visits. These visits should occur yearly (can not be scheduled less than 1 calendar year apart) and cover preventive health, immunizations, advance directives and screenings you are entitled to yearly through your medicare benefits. Do not miss out on your entitled benefits, this is when medicare will pay for these benefits to be ordered for you.  These are strongly encouraged by your provider and is the appropriate type of visit to make certain you are up to date with all preventive health benefits. If you have not had your medicare wellness exam in the last 12 months, please make certain to schedule one by calling the office and schedule your medicare wellness with Maudie Mercury as soon as possible.   Yearly physical (well visit):  - Adults are recommended to be seen yearly for physicals. Check with your insurance and date of your last physical, most insurances require one calendar year between physicals.  Physicals include all preventive health topics, screenings, medical exam and labs that are appropriate for gender/age and history. You may have fasting labs needed at this visit. This is a well visit (not a sick visit), new problems should not be covered during this visit (see acute visit).  - Pediatric patients are seen more frequently when they are younger. Your provider will advise you on well child visit timing that is appropriate for your their age. - This is not a medicare wellness visit. Medicare wellness exams do not have an exam portion to the visit. Some medicare companies allow for a physical, some do not allow a yearly physical. If your medicare allows a yearly physical you can schedule the medicare wellness with our nurse Maudie Mercury and have your physical with your provider after, on the same day. Please check with insurance for your full benefits.   Late Policy/No Shows:  - all new patients should arrive 15-30 minutes earlier than appointment to allow [place] time  to  obtain all personal demographics,  insurance information  and for you to complete office paperwork. - All established patients should arrive 10-15 minutes earlier than appointment time to update all information and be checked in .  - In our best efforts to run on time, if you are late for your appointment you will be asked to either reschedule or if able, we will work you back into the schedule. There will be a wait time to work you back in the schedule,  depending on availability.  - If you are unable to make it to your appointment as scheduled, please call 24 hours ahead of time to allow [place] to fill the time slot with someone else who needs to be seen. If you do not cancel your appointment ahead of time, you may be charged a no show fee.

## 2017-07-08 ENCOUNTER — Encounter: Payer: Self-pay | Admitting: Family Medicine

## 2017-08-19 ENCOUNTER — Encounter: Payer: Self-pay | Admitting: Family Medicine

## 2017-08-20 ENCOUNTER — Telehealth: Payer: Self-pay | Admitting: Family Medicine

## 2017-08-20 NOTE — Telephone Encounter (Signed)
Received patient my chart messages desire to stop his Seroquel medication. He is not specifically asking for any guidance, as opposed to informing this provider. However he is set on stopping medications I would advise him to taper off slowly by taking a half tab daily and following up the first week in November.

## 2017-08-21 NOTE — Telephone Encounter (Signed)
LM for pt to taper slowly and begin taking 1/2 a tab daily and to follow up the first week in November. Advised he could call to schedule or schedule via MyChart.

## 2017-09-03 ENCOUNTER — Encounter: Payer: Self-pay | Admitting: Family Medicine

## 2017-09-03 ENCOUNTER — Ambulatory Visit (INDEPENDENT_AMBULATORY_CARE_PROVIDER_SITE_OTHER): Payer: PRIVATE HEALTH INSURANCE | Admitting: Family Medicine

## 2017-09-03 VITALS — BP 138/78 | HR 70 | Temp 98.4°F | Resp 20 | Ht 74.0 in | Wt 189.0 lb

## 2017-09-03 DIAGNOSIS — F339 Major depressive disorder, recurrent, unspecified: Secondary | ICD-10-CM

## 2017-09-03 DIAGNOSIS — F4001 Agoraphobia with panic disorder: Secondary | ICD-10-CM

## 2017-09-03 MED ORDER — QUETIAPINE FUMARATE 100 MG PO TABS: 100.0000 mg | ORAL_TABLET | Freq: Every day | ORAL | 1 refills | Status: DC

## 2017-09-03 NOTE — Patient Instructions (Signed)
1. Exercise 30 minutes a day or 150 minutes a week.  2. I have refilled your seroquel for you.  3. Refer to Dr. Mamie Levers, if you can not schedule let her know.  4. Family services of the [facility] or [place] have extended hours.    Major Depressive Disorder, Adult Major depressive disorder (MDD) is a mental health condition. It may also be called clinical depression or unipolar depression. MDD usually causes feelings of sadness, hopelessness, or helplessness. MDD can also cause physical symptoms. It can interfere with work, school, relationships, and other everyday activities. MDD may be mild, moderate, or severe. It may occur once (single episode major depressive disorder) or it may occur multiple times (recurrent major depressive disorder). What are the causes? The exact cause of this condition is not known. MDD is most likely caused by a combination of things, which may include:  Genetic factors. These are traits that are passed along from parent to child.  Individual factors. Your personality, your behavior, and the way you handle your thoughts and feelings may contribute to MDD. This includes personality traits and behaviors learned from others.  Physical factors, such as: ? Differences in the part of your brain that controls emotion. This part of your brain may be different than it is in people who do not have MDD. ? Long-term (chronic) medical or psychiatric illnesses.  Social factors. Traumatic experiences or major life changes may play a role in the development of MDD.  What increases the risk? This condition is more likely to develop in women. The following factors may also make you more likely to develop MDD:  A family history of depression.  Troubled family relationships.  Abnormally low levels of certain brain chemicals.  Traumatic events in childhood, especially abuse or the loss of a parent.  Being under a lot of stress, or long-term stress, especially from upsetting  life experiences or losses.  A history of: ? Chronic physical illness. ? Other mental health disorders. ? Substance abuse.  Poor living conditions.  Experiencing social exclusion or discrimination on a regular basis.  What are the signs or symptoms? The main symptoms of MDD typically include:  Constant depressed or irritable mood.  Loss of interest in things and activities.  MDD symptoms may also include:  Sleeping or eating too much or too little.  Unexplained weight change.  Fatigue or low energy.  Feelings of worthlessness or guilt.  Difficulty thinking clearly or making decisions.  Thoughts of suicide or of harming others.  Physical agitation or weakness.  Isolation.  Severe cases of MDD may also occur with other symptoms, such as:  Delusions or hallucinations, in which you imagine things that are not real (psychotic depression).  Low-level depression that lasts at least a year (chronic depression or persistent depressive disorder).  Extreme sadness and hopelessness (melancholic depression).  Trouble speaking and moving (catatonic depression).  How is this diagnosed? This condition may be diagnosed based on:  Your symptoms.  Your medical history, including your mental health history. This may involve tests to evaluate your mental health. You may be asked questions about your lifestyle, including any drug and alcohol use, and how long you have had symptoms of MDD.  A physical exam.  Blood tests to rule out other conditions.  You must have a depressed mood and at least four other MDD symptoms most of the day, nearly every day in the same 2-week timeframe before your health care provider can confirm a diagnosis of MDD. How  is this treated? This condition is usually treated by mental health professionals, such as psychologists, psychiatrists, and clinical social workers. You may need more than one type of treatment. Treatment may include:  Psychotherapy.  This is also called talk therapy or counseling. Types of psychotherapy include: ? Cognitive behavioral therapy (CBT). This type of therapy teaches you to recognize unhealthy feelings, thoughts, and behaviors, and replace them with positive thoughts and actions. ? Interpersonal therapy (IPT). This helps you to improve the way you relate to and communicate with others. ? Family therapy. This treatment includes members of your family.  Medicine to treat anxiety and depression, or to help you control certain emotions and behaviors.  Lifestyle changes, such as: ? Limiting alcohol and drug use. ? Exercising regularly. ? Getting plenty of sleep. ? Making healthy eating choices. ? Spending more time outdoors.  Treatments involving stimulation of the brain can be used in situations with extremely severe symptoms, or when medicine or other therapies do not work over time. These treatments include electroconvulsive therapy, transcranial magnetic stimulation, and vagal nerve stimulation. Follow these instructions at home: Activity  Return to your normal activities as told by your health care provider.  Exercise regularly and spend time outdoors as told by your health care provider. General instructions  Take over-the-counter and prescription medicines only as told by your health care provider.  Do not drink alcohol. If you drink alcohol, limit your alcohol intake to no more than 1 drink a day for nonpregnant women and 2 drinks a day for men. One drink equals 12 oz of beer, 5 oz of wine, or 1 oz of hard liquor. Alcohol can affect any antidepressant medicines you are taking. Talk to your health care provider about your alcohol use.  Eat a healthy diet and get plenty of sleep.  Find activities that you enjoy doing, and make time to do them.  Consider joining a support group. Your health care provider may be able to recommend a support group.  Keep all follow-up visits as told by your health care  provider. This is important. Where to find more information: [employer] on Mental Illness  www.nami.org  U.S. National Institute of Mental Health  https://carter.com/  National Suicide Prevention Lifeline  1-800-273-TALK 8125897564). This is free, 24-hour help.  Contact a health care provider if:  Your symptoms get worse.  You develop new symptoms. Get help right away if:  You self-harm.  You have serious thoughts about hurting yourself or others.  You see, hear, taste, smell, or feel things that are not present (hallucinate). This information is not intended to replace advice given to you by your health care provider. Make sure you discuss any questions you have with your health care provider. Document Released: 02/10/2013 Document Revised: 06/22/2016 Document Reviewed: 04/26/2016 Elsevier Interactive Patient Education  2017 [employer].

## 2017-09-03 NOTE — Progress Notes (Signed)
Patient ID: Makell Cyr, male   DOB: Apr 07, 1971, 46 y.o.   MRN: [id number]      Patient ID: Jourdan Durbin, male  DOB: Jan 13, 1971, 46 y.o.   MRN: [id number] Patient Care Team    Relationship Specialty Notifications Start End  Ma Hillock, DO PCP - General Family Medicine  05/05/16     Subjective:  Cleland Simkins is a 46 y.o.  male present for follow up on anxiety and depression.   Panic/anxiety/depression:  Pt reports he feels worse. No motivation for studies or self. He started a project for his sister and quit, he does not know why. He is unable to explain the circumstances or feelings surrounding his current state, other than  he "felt like I was walking in the ocean, doing ok, and then fell off a ledge."  He has a new job for [employer]. He is getting many "steps" in while working. He wants to feel better, but is not sure if the medication is the cause of him feeling this way. He does not want to start a new medication.   Prior note. Patient presents for follow-up on his anxiety and depression today. He states he is doing well on the Seroquel 100 mg daily at bedtime. He is taking this medication around 7:53 PM at night. He still having some difficulty falling asleep, sometimes not able to fall asleep until 11 PM. He does have to get up early for his job at 5 AM, so he has concerns about any additional medications. Overall he feels he is doing well on this medication. He is attempting become more social, and exercise more. He just finished some summer classes, and is registered for fall classes to further his education. He still works full-time and takes care of his mother as well.   Depression screen [facility] 2/9 09/03/2017 06/19/2017 05/05/2016 05/05/2016  Decreased Interest 1 1 1 2   Down, Depressed, Hopeless 3 1 1 2   PHQ - 2 Score 4 2 2 4   Altered sleeping 2 2 - 2  Tired, decreased energy 3 1 - 2  Change in appetite 0 0 - 0  Feeling bad or failure about yourself  - 0 - 0  Trouble concentrating  - 1 - 0  Moving slowly or fidgety/restless 1 1 - 0  Suicidal thoughts 0 0 - 0  PHQ-9 Score 10 7 - 8  Difficult doing work/chores - Somewhat difficult - Somewhat difficult     GAD 7 : Generalized Anxiety Score 09/03/2017 06/19/2017 05/05/2016  Nervous, Anxious, on Edge 1 1 1   Control/stop worrying 1 1 1   Worry too much - different things 1 1 1   Trouble relaxing 1 2 2   Restless 2 2 2   Easily annoyed or irritable 1 1 1   Afraid - awful might happen 1 0 0  Total GAD 7 Score 8 8 8   Anxiety Difficulty Somewhat difficult Somewhat difficult Somewhat difficult   Mood disorder: negative, only secondary to #2 --> no. 7 yes in #1, and #3 with moderate   Immunization History  Administered Date(s) Administered  . Influenza,inj,Quad PF,6+ Mos 10/13/2016  . Influenza-Unspecified 07/31/2015  . Tdap 10/30/2013     Past Medical History:  Diagnosis Date  . Anxiety   . Aortic regurgitation   . Chicken pox   . Depression   . Gilbert disease   . Hearing impaired    age 48  . Heart murmur    since childhood  . Mitral regurgitation   .  MVP (mitral valve prolapse)   . Panic disorder 2015  . Spermatocele   . Thrombocytopenia (HCC)    mild, chronic  . Vitamin D deficiency    No Known Allergies Past Surgical History:  Procedure Laterality Date  . CHOLECYSTECTOMY  2007  . [place]  . SHOULDER SURGERY  1997   Family History  Problem Relation Age of Onset  . Mental illness Mother   . Mental illness Father   . Mental illness Sister   . Mental illness Brother   . Thyroid cancer Maternal Aunt   . Mental illness Maternal Aunt   . Mental illness Maternal Uncle   . Mental illness Paternal Aunt   . Mental illness Paternal Uncle   . Mental illness Maternal Grandmother   . Pancreatic cancer Maternal Grandfather   . Mental illness Maternal Grandfather   . Mental illness Paternal Grandmother   . Mental illness Paternal Grandfather    Social History   Socioeconomic History   . Marital status: Single    Spouse name: Not on file  . Number of children: Not on file  . Years of education: Not on file  . Highest education level: Not on file  Social Needs  . Financial resource strain: Not on file  . Food insecurity - worry: Not on file  . Food insecurity - inability: Not on file  . Transportation needs - medical: Not on file  . Transportation needs - non-medical: Not on file  Occupational History  . Not on file  Tobacco Use  . Smoking status: Never Smoker  . Smokeless tobacco: Never Used  Substance and Sexual Activity  . Alcohol use: Yes    Alcohol/week: 1.2 oz    Types: 2 Glasses of wine per week  . Drug use: No  . Sexual activity: No  Other Topics Concern  . Not on file  Social History Narrative   Single. No children.    Associates degree. Works full time as Financial trader for [employer].   No tobacco, drug use. Occasional ETOH.    Drink caffeine    Wears seatbelt, bike helmet   Bilateral hearing aids   Smoke detector in the home.    Feels safe in relationships.    Allergies as of 09/03/2017   No Known Allergies     Medication List        Accurate as of 09/03/17  1:58 PM. Always use your most recent med list.          QUEtiapine 100 MG tablet Commonly known as:  SEROQUEL Take 1 tablet (100 mg total) by mouth at bedtime.        No results found for this or any previous visit (from the past 2160 hour(s)).   ROS: 14 pt review of systems performed and negative (unless mentioned in an HPI)  Objective: BP 138/78 (BP Location: Right Arm, Patient Position: Sitting, Cuff Size: Normal)   Pulse 70   Temp 98.4 F (36.9 C)   Resp 20   Ht 6' 2"  (1.88 m)   Wt 189 lb (85.7 kg)   SpO2 98%   BMI 24.27 kg/m  Gen: Afebrile. No acute distress.  HENT: AT. [place].  MMM.  Eyes:Pupils Equal Round Reactive to light, Extraocular movements intact,  Conjunctiva without redness, discharge or icterus. Neuro:Normal gait. PERLA. EOMi. Alert. Oriented x3    Psych: mildly sad, otherwise Normal affect, dress and demeanor. Normal speech. Normal thought content and judgment.    Assessment/plan:  Moshe Wenger is a 46 y.o. male present for new pt establishment with acute complaint.  Depression/Anxiety disorder, unspecified anxiety disorder type - Discussed the need of medications, pt would like to try a "more natural" approach. He admits going to therapy is very difficult for him because he can not miss work. - He declines start of a new med, discussed trying add on abilify.  - for now he wants to stay on seroquel at current dose.  - prescribed exercise at least 150 minutes a week (outside of his steps at work).   - referral to psych, to see if he can make time.  - [facility] contact info also provided for him - Follow-up 6 months, sooner if needed.  Electronically signed by: Howard Pouch, DO [place]

## 2017-09-25 ENCOUNTER — Encounter: Payer: Self-pay | Admitting: *Deleted

## 2017-09-25 ENCOUNTER — Telehealth: Payer: Self-pay | Admitting: Family Medicine

## 2017-09-25 NOTE — Telephone Encounter (Signed)
Patient was not contacted by this office message sent in My Chart letting him know. Let him know to respond to message if needing anything further

## 2017-09-25 NOTE — Telephone Encounter (Signed)
Spoke w/ agent and she said patient said that someone called him Friday, but that would not have been [place].  Per Estill Bamberg, please contact patient.

## 2017-09-25 NOTE — Telephone Encounter (Signed)
Sent an inquiry to patient in My Chart.

## 2017-09-25 NOTE — Telephone Encounter (Signed)
Copied from [place]. Topic: Quick Communication - Office Called Patient >> Sep 25, 2017 11:36 AM Burnis Medin, NT wrote: Reason for CRM: Pt called back but no CRM noted.

## 2017-12-20 ENCOUNTER — Encounter: Payer: BLUE CROSS/BLUE SHIELD | Admitting: Family Medicine

## 2018-01-08 ENCOUNTER — Encounter: Payer: Self-pay | Admitting: Family Medicine

## 2018-03-01 ENCOUNTER — Ambulatory Visit (INDEPENDENT_AMBULATORY_CARE_PROVIDER_SITE_OTHER): Payer: BLUE CROSS/BLUE SHIELD | Admitting: Family Medicine

## 2018-03-01 ENCOUNTER — Encounter: Payer: Self-pay | Admitting: Family Medicine

## 2018-03-01 VITALS — BP 133/86 | HR 81 | Temp 97.7°F | Ht 74.0 in | Wt 192.4 lb

## 2018-03-01 DIAGNOSIS — F339 Major depressive disorder, recurrent, unspecified: Secondary | ICD-10-CM

## 2018-03-01 DIAGNOSIS — Z79899 Other long term (current) drug therapy: Secondary | ICD-10-CM

## 2018-03-01 DIAGNOSIS — F4001 Agoraphobia with panic disorder: Secondary | ICD-10-CM | POA: Diagnosis not present

## 2018-03-01 LAB — COMPREHENSIVE METABOLIC PANEL
ALBUMIN: 4.4 g/dL (ref 3.5–5.2)
ALT: 26 U/L (ref 0–53)
AST: 23 U/L (ref 0–37)
Alkaline Phosphatase: 96 U/L (ref 39–117)
BILIRUBIN TOTAL: 1.3 mg/dL — AB (ref 0.2–1.2)
BUN: 20 mg/dL (ref 6–23)
CALCIUM: 9.7 mg/dL (ref 8.4–10.5)
CO2: 30 mEq/L (ref 19–32)
CREATININE: 0.98 mg/dL (ref 0.40–1.50)
Chloride: 105 mEq/L (ref 96–112)
GFR: 87.27 mL/min (ref >60.00)
Glucose, Bld: 85 mg/dL (ref 70–99)
Potassium: 4.3 mEq/L (ref 3.5–5.1)
Sodium: 142 mEq/L (ref 135–145)
Total Protein: 6.6 g/dL (ref 6.0–8.3)

## 2018-03-01 MED ORDER — QUETIAPINE FUMARATE 100 MG PO TABS: 100.0000 mg | ORAL_TABLET | Freq: Every day | ORAL | 1 refills | Status: DC
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
## 2018-03-01 NOTE — Patient Instructions (Signed)
I was nice to see you today.  I have refilled you meds.  I hope you do find some [place] time very soon. Hopefully this move will work in your favor.   If you need anything please let us know.

## 2018-03-04 ENCOUNTER — Encounter: Payer: Self-pay | Admitting: Family Medicine

## 2018-03-04 ENCOUNTER — Ambulatory Visit: Payer: PRIVATE HEALTH INSURANCE | Admitting: Family Medicine

## 2018-07-08 ENCOUNTER — Encounter: Payer: Self-pay | Admitting: Family Medicine

## 2018-07-08 ENCOUNTER — Ambulatory Visit (INDEPENDENT_AMBULATORY_CARE_PROVIDER_SITE_OTHER): Payer: BLUE CROSS/BLUE SHIELD | Admitting: Family Medicine

## 2018-07-08 ENCOUNTER — Encounter: Payer: Self-pay | Admitting: *Deleted

## 2018-07-08 VITALS — BP 129/84 | HR 90 | Temp 98.5°F | Resp 20 | Ht 74.0 in | Wt 186.0 lb

## 2018-07-08 DIAGNOSIS — R42 Dizziness and giddiness: Secondary | ICD-10-CM | POA: Diagnosis not present

## 2018-07-08 DIAGNOSIS — M6281 Muscle weakness (generalized): Secondary | ICD-10-CM

## 2018-07-08 DIAGNOSIS — R5383 Other fatigue: Secondary | ICD-10-CM

## 2018-07-08 LAB — COMPREHENSIVE METABOLIC PANEL
ALT: 26 U/L (ref 0–53)
AST: 22 U/L (ref 0–37)
Albumin: 4.8 g/dL (ref 3.5–5.2)
Alkaline Phosphatase: 88 U/L (ref 39–117)
BILIRUBIN TOTAL: 1.8 mg/dL — AB (ref 0.2–1.2)
BUN: 20 mg/dL (ref 6–23)
CO2: 28 meq/L (ref 19–32)
Calcium: 9.9 mg/dL (ref 8.4–10.5)
Chloride: 104 mEq/L (ref 96–112)
Creatinine, Ser: 1.01 mg/dL (ref 0.40–1.50)
GFR: 84.16 mL/min (ref >60.00)
GLUCOSE: 88 mg/dL (ref 70–99)
POTASSIUM: 4.2 meq/L (ref 3.5–5.1)
Sodium: 139 mEq/L (ref 135–145)
Total Protein: 7.1 g/dL (ref 6.0–8.3)

## 2018-07-08 LAB — CK: Total CK: 81 U/L (ref 7–232)

## 2018-07-08 LAB — CBC
HCT: 47.2 % (ref 39.0–52.0)
HEMOGLOBIN: 16.8 g/dL (ref 13.0–17.0)
MCHC: 35.6 g/dL (ref 30.0–36.0)
MCV: 91.8 fl (ref 78.0–100.0)
Platelets: 163 10*3/uL (ref 150.0–400.0)
RBC: 5.14 Mil/uL (ref 4.22–5.81)
RDW: 13 % (ref 11.5–15.5)
WBC: 7.8 10*3/uL (ref 4.0–10.5)

## 2018-07-08 LAB — MAGNESIUM: Magnesium: 2.3 mg/dL (ref 1.5–2.5)

## 2018-07-08 NOTE — Progress Notes (Signed)
Chris Robertson , 09/24/1971, 47 y.o., male MRN: [id number] Patient Care Team    Relationship Specialty Notifications Start End  Ma Hillock, DO PCP - General Family Medicine  05/05/16     Chief Complaint  Patient presents with  . had episode of weakness    had episode of weakness after exercise(walking)     Subjective: Pt presents for an OV with complaints of an episode of generalized weakness on Saturday when walking on the trails for exercise. He reports he had breakfast, which was a bowl of cereal and toast before setting out to exercise. He had been drinking plenty of water. He ws a little half way into his walk when he felt dizzy, confused and he reports his heart rate went up. He felt a generalized muscle weakness and set down in the grass. A bystander gave him water and called 911. EMS checked a tracing, BG and BP, all of which were normal. He has not had repeat of symptoms since Saturday. He reports he has remained fatigued feeling. Pt has tried nothing to ease their symptoms. He denies fever, chills, nausea, vomit or recent illness. He does report a similar event many years ago, in which he was told his electrolytes were abnormal. He endorses mildly concentrated urine and occasional muscle spasms.   Depression screen [facility] 2/9 07/08/2018 03/01/2018 09/03/2017 06/19/2017 05/05/2016  Decreased Interest - 1 1 1 1   Down, Depressed, Hopeless 0 2 3 1 1   PHQ - 2 Score 0 3 4 2 2   Altered sleeping 1 2 2 2  -  Tired, decreased energy 0 2 3 1  -  Change in appetite 0 0 0 0 -  Feeling bad or failure about yourself  - 0 - 0 -  Trouble concentrating 0 0 - 1 -  Moving slowly or fidgety/restless 0 1 1 1  -  Suicidal thoughts 0 0 0 0 -  PHQ-9 Score 1 8 10 7  -  Difficult doing work/chores Not difficult at all Somewhat difficult - Somewhat difficult -    Allergies  Allergen Reactions  . Zoloft [Sertraline Hcl] Other (See Comments)    Night sweats and insomnia    Social History   Tobacco Use  .  Smoking status: Never Smoker  . Smokeless tobacco: Never Used  Substance Use Topics  . Alcohol use: Yes    Alcohol/week: 2.0 standard drinks    Types: 2 Glasses of wine per week   Past Medical History:  Diagnosis Date  . Anxiety   . Aortic regurgitation 12/05/2011  . Chicken pox   . Depression   . Echocardiogram abnormal 12/05/2011   Mild MVP, MR, AR  . Gilbert disease   . Hearing impaired    age 44  . Heart murmur    since childhood  . Kidney stone   . Mitral regurgitation   . MVP (mitral valve prolapse)   . Panic disorder 2015  . Spermatocele 08/2012   hyrocele bilateral and spermatocele left; PSA normal 2013-2016  . Thrombocytopenia (HCC)    mild, chronic  . Vitamin D deficiency    Past Surgical History:  Procedure Laterality Date  . CHOLECYSTECTOMY  2007  . [place]  . SHOULDER SURGERY  1997   Family History  Problem Relation Age of Onset  . Mental illness Mother   . Huntington's disease Mother   . Mental illness Father   . Hypertension Father   . Diabetes Father   . Hyperlipidemia Father   .  Mental illness Sister   . Mental illness Brother   . Thyroid cancer Maternal Aunt   . Mental illness Maternal Aunt   . Mental illness Maternal Uncle   . Mental illness Paternal Aunt   . Mental illness Paternal Uncle   . Mental illness Maternal Grandmother   . Pancreatic cancer Maternal Grandfather   . Mental illness Maternal Grandfather   . Mental illness Paternal Grandmother   . Mental illness Paternal Grandfather    Allergies as of 07/08/2018      Reactions   Zoloft [sertraline Hcl] Other (See Comments)   Night sweats and insomnia      Medication List        Accurate as of 07/08/18 10:50 AM. Always use your most recent med list.          QUEtiapine 100 MG tablet Commonly known as:  SEROQUEL Take 1 tablet (100 mg total) by mouth at bedtime.   Vitamin D 2000 units Caps       All past medical history, surgical history, allergies,  family history, immunizations andmedications were updated in the EMR today and reviewed under the history and medication portions of their EMR.     ROS: Negative, with the exception of above mentioned in HPI   Objective:  BP 129/84 (BP Location: Right Arm, Patient Position: Sitting, Cuff Size: Large)   Pulse 90   Temp 98.5 F (36.9 C)   Resp 20   Ht 6' 2"  (1.88 m)   Wt 186 lb (84.4 kg)   SpO2 99%   BMI 23.88 kg/m  Body mass index is 23.88 kg/m. Gen: Afebrile. No acute distress. Nontoxic in appearance, well developed, well nourished.  HENT: AT. . MMM Eyes:Pupils Equal Round Reactive to light, Extraocular movements intact,  Conjunctiva without redness, discharge or icterus. CV: RRR no murmur, no edema Chest: CTAB, no wheeze or crackles. Good air movement, normal resp effort.  Neuro: Normal gait. PERLA. EOMi. Alert. Oriented x3  Psych: Normal affect, dress and demeanor. Normal speech. Normal thought content and judgment.  No exam data present No results found. No results found for this or any previous visit (from the past 24 hour(s)).  Assessment/Plan: Ashkan Chamberland is a 47 y.o. male present for OV for  fatigue/Muscle weakness (generalized)/Dizziness No symptoms today, except mild fatigue remaining. Unknown cause, sounds like heat exhaustion or dehydration. Discussed these with him today. Will check electrolytes/mag/CK since he has had some muscle spasms also.  Encouraged to continue routine diet. Will guide him more once labs returned.  Work excuse provided for today - CBC - Comp Met (CMET) - CK (Creatine Kinase) - Magnesium - F/U dependent on lab results.    Reviewed expectations re: course of current medical issues.  Discussed self-management of symptoms.  Outlined signs and symptoms indicating need for more acute intervention.  Patient verbalized understanding and all questions were answered.  Patient received an After-Visit Summary.    No orders of the  defined types were placed in this encounter.    Note is dictated utilizing voice recognition software. Although note has been proof read prior to signing, occasional typographical errors still can be missed. If any questions arise, please do not hesitate to call for verification.   electronically signed by:  Howard Pouch, DO  [place]

## 2018-07-08 NOTE — Patient Instructions (Addendum)
I suspect your symptoms may have been mild dehydration of heat exhaustation. We check some labs to day to see if electrolytes normal.  Eat your routine diet, hydrate and rest today.   Heat Exhaustion Information WHAT IS HEAT EXHAUSTION? Heat exhaustion happens when your body gets overheated from hot weather or from exercise. Heat exhaustion can lead to heat stroke, a life-threatening condition that requires emergency care. Heat exhaustion is more likely to develop when:  You are exercising or being active.  You are in hot or humid weather.  You are in bright sunshine.  You are not drinking enough water.  WHO IS AT RISK FOR THIS CONDITION? This condition is more likely to develop in:  People who exercise in hot or humid weather.  People who exercise beyond their fitness level.  People who wear clothing that does not allow sweat to evaporate.  People who are dehydrated.  People who drink a lot of alcoholic beverages or beverages that have caffeine. This can lead to dehydration.  People who are age 69 or older.  Children.  People who have a medical condition such as heart disease, poor circulation, sickle cell disease, or high blood pressure.  People who have a fever.  People who are very overweight (obese).  WHAT ARE THE SYMPTOMS OF THIS CONDITION? Symptoms of heat exhaustion include:  Heavy sweating along with feeling weak, dizzy, light-headed, and nauseous.  Rapid heartbeat.  Headache.  Urine that is darker than normal.  Muscle cramps, such as in the leg or side (flank).  Moist, cool, and clammy skin.  Fatigue.  Thirst.  Confusion.  Fainting.  WHAT SHOULD I DO IF I THINK I HAVE THIS CONDITION? If you think that you have heat exhaustion, call your health care provider. Follow his or her instructions. You should also:  Call a friend or a family member and ask him or her to stay with you.  Move to a cooler location, such as: ? Into the shade. ? In  front of a fan. ? An air-conditioned space.  Lie down and rest.  Slowly drink nonalcoholic, caffeine-free fluids.  Take off tight clothing or extra clothing.  Take a cool bath or shower, if possible. If you do not have access to a bath or shower, dab or mist cool water on your skin.  WHY IS IT IMPORTANT TO TREAT THIS CONDITION? It is important to take care of yourself and treat heat exhaustion as soon as possible. Untreated heat exhaustion can turn into heat stroke, which is a life-threatening condition that requires urgent medical treatment. HOW CAN I PREVENT THIS CONDITION? To prevent this condition:  Drink enough fluid to keep your urine clear or pale yellow. This helps your body to sweat properly.  Avoid outdoor activities on very hot or humid days.  Do not exercise or do other physical activity when you are not feeling well.  Take breaks often during physical activity.  Wear light-colored, loose-fitting, and lightweight clothing when it is hot outside.  Wear a hat and use sunscreen when exercising outdoors.  Avoid being outside during the hottest times of the day.  Check with your health care provider before you start any new activity, especially if you take medicine or have a medical condition.  Start any new activity slowly and work up to your fitness level.  HOW CAN I HELP TO PROTECT ELDERLY RELATIVES AND NEIGHBORS FROM THIS CONDITION? People who are age 19 or older are at greater risk for heat exhaustion. Their  bodies have a harder time adjusting to heat. They are also more likely to have a medical condition or be on medicines that increase their risk for heat exhaustion. They may get heat exhaustion indoors if the heat is high for several days. You can help to protect them during hot weather by:  Checking on them two or more times each day.  Making sure that they are drinking plenty of cool, nonalcoholic, and caffeine-free fluids.  Making sure that they use their  air conditioner.  Taking them to a location where air conditioning is available.  Talking with their health care provider about their medical needs, medicines, and fluid requirements.  SEEK MEDICAL CARE IF:  Your symptoms last longer than 30 minutes.  SEEK IMMEDIATE MEDICAL CARE IF:  You have any symptoms of heat stroke. These include: ? Fever. ? Vomiting. ? Red skin. ? Inability to sweat, resulting in hot, dry skin. ? Excessive thirst. ? Rapid breathing. ? Headache. ? Confusion or disorientation. ? Fainting. ? Seizures. These symptoms may represent a serious problem that is an emergency. Do not wait to see if the symptoms will go away. Get medical help right away. Call your local emergency services (911 in the [place] Do not drive yourself to the hospital. This information is not intended to replace advice given to you by your health care provider. Make sure you discuss any questions you have with your health care provider. Document Released: 07/25/2008 Document Revised: 05/05/2016 Document Reviewed: 02/06/2016 Elsevier Interactive Patient Education  2018 [employer].   Electrolytes Test Electrolytes are minerals. They are found in your body's blood and tissues in the form of dissolved salts. Electrolytes help keep the amount of water in your body in balance. They also help move nutrients into, and waste out of, your body. There should be a variety of electrolytes in your body. The electrolytes test, also called an electrolytes panel, usually checks levels of potassium, sodium, chloride, and bicarbonate. If these are too high or too low, it can indicate an electrolyte imbalance. This test is often done along with other tests. Checking your level of electrolytes can help your health care provider make a diagnosis. A wide range of medical conditions can cause an electrolyte imbalance. It can be a result of:  Kidney problems.  Heart problems.  Muscle and nerve  problems.  Diabetes.  Vomiting.  Diarrhea.  You may have an electrolytes test as part of a routine health screening. You may also have this test if your health care provider suspects that you have an imbalance that needs to be corrected. This test requires a blood sample taken from a vein in your arm. You may also need to provide a urine sample. How do I prepare for this test? Some medicines may alter certain electrolyte balances. Ask your health care provider if you need to stop taking any medicines before the test. What do the results mean? It is your responsibility to obtain your test results. Ask the lab or department performing the test when and how you will get your results. Contact your health care provider to discuss any questions you have about your results. The results of the electrolyte test will be a range of values. Electrolytes are measured in milliequivalents per liter (mEq/L) or milligrams per deciliter (mg/dL). Electrolyte levels that are above or below the normal range may indicate a problem. Range of Normal Values Ranges for normal values vary among different labs and hospitals. You should always check with your health  care provider after having lab work or other tests done to discuss whether your values are considered within normal limits. A normal result for this test will vary, depending on the electrolyte being tested. Meaning of Results Outside Normal Range Values If you have one or more electrolytes outside of the normal value range, your health care provider probably will try to correct the imbalance. This can be done through diet, fluid intake, and medicines. Your health care provider will analyze the results of your electrolytes test along with your results from other tests to make a diagnosis. Talk with your health care provider to discuss your results, treatment options, and if necessary, the need for more tests. Talk with your health care provider if you have any  questions about your results. This information is not intended to replace advice given to you by your health care provider. Make sure you discuss any questions you have with your health care provider. Document Released: 11/10/2004 Document Revised: 06/20/2016 Document Reviewed: 01/16/2014 Elsevier Interactive Patient Education  [employer].

## 2018-07-09 ENCOUNTER — Telehealth: Payer: Self-pay | Admitting: Family Medicine

## 2018-07-09 NOTE — Telephone Encounter (Signed)
Please inform patient the following information: His electrolytes are normal. His liver and kidney function are normal. His blood counts are normal (no anemia). He can return to his usual daily diet. It symptoms could have been secondary to heat or mild dehydration. No identifiable cause by labs. His bilirubin is slightly higher than normal and can be seen if he was fasting at the collection of labs with his h/o Rosanna Randy disease and is ok. Please ask him if he had eaten anything yesterday before the labs were collected?

## 2018-07-09 NOTE — Telephone Encounter (Signed)
Patient notified and verbalized understanding. He stated that he normally has an elevated Bilirubin and he did eat a bowl of cereal around 8:00am.

## 2018-07-15 ENCOUNTER — Ambulatory Visit: Payer: BLUE CROSS/BLUE SHIELD | Admitting: Family Medicine

## 2018-07-18 ENCOUNTER — Encounter: Payer: Self-pay | Admitting: Family Medicine

## 2018-07-18 ENCOUNTER — Ambulatory Visit: Payer: BLUE CROSS/BLUE SHIELD | Admitting: Family Medicine

## 2018-07-18 VITALS — BP 132/85 | HR 71 | Temp 98.2°F | Resp 20 | Ht 74.0 in | Wt 187.2 lb

## 2018-07-18 DIAGNOSIS — R51 Headache: Secondary | ICD-10-CM | POA: Diagnosis not present

## 2018-07-18 DIAGNOSIS — R531 Weakness: Secondary | ICD-10-CM

## 2018-07-18 NOTE — Progress Notes (Signed)
Chris Robertson , Sep 01, 1971, 47 y.o., male MRN: [id number] Patient Care Team    Relationship Specialty Notifications Start End  Ma Hillock, DO PCP - General Family Medicine  05/05/16     Chief Complaint  Patient presents with  . Follow-up    muscle weakness,headache     Subjective: Chris Robertson is a 47 y.o. male present for follow up on headache and muscle weakness. He denies any further symptoms of dizziness or passing out. He has had occasional frontal headache, that can last a few hours are mild and go away on their own. He does endorse mild itchy water eyes and sneezing.  Prior note: Pt presents for an OV with complaints of an episode of generalized weakness on Saturday when walking on the trails for exercise. He reports he had breakfast, which was a bowl of cereal and toast before setting out to exercise. He had been drinking plenty of water. He ws a little half way into his walk when he felt dizzy, confused and he reports his heart rate went up. He felt a generalized muscle weakness and set down in the grass. A bystander gave him water and called 911. EMS checked a tracing, BG and BP, all of which were normal. He has not had repeat of symptoms since Saturday. He reports he has remained fatigued feeling. Pt has tried nothing to ease their symptoms. He denies fever, chills, nausea, vomit or recent illness. He does report a similar event many years ago, in which he was told his electrolytes were abnormal. He endorses mildly concentrated urine and occasional muscle spasms.   Depression screen [facility] 2/9 07/08/2018 03/01/2018 09/03/2017 06/19/2017 05/05/2016  Decreased Interest - 1 1 1 1   Down, Depressed, Hopeless 0 2 3 1 1   PHQ - 2 Score 0 3 4 2 2   Altered sleeping 1 2 2 2  -  Tired, decreased energy 0 2 3 1  -  Change in appetite 0 0 0 0 -  Feeling bad or failure about yourself  - 0 - 0 -  Trouble concentrating 0 0 - 1 -  Moving slowly or fidgety/restless 0 1 1 1  -  Suicidal thoughts 0 0 0 0 -    PHQ-9 Score 1 8 10 7  -  Difficult doing work/chores Not difficult at all Somewhat difficult - Somewhat difficult -    No Known Allergies Social History   Tobacco Use  . Smoking status: Never Smoker  . Smokeless tobacco: Never Used  Substance Use Topics  . Alcohol use: Yes    Alcohol/week: 2.0 standard drinks    Types: 2 Glasses of wine per week   Past Medical History:  Diagnosis Date  . Anxiety   . Aortic regurgitation 12/05/2011  . Chicken pox   . Depression   . Echocardiogram abnormal 12/05/2011   Mild MVP, MR, AR  . Gilbert disease   . Hearing impaired    age 70  . Heart murmur    since childhood  . Kidney stone   . Mitral regurgitation   . MVP (mitral valve prolapse)   . Panic disorder 2015  . Spermatocele 08/2012   hyrocele bilateral and spermatocele left; PSA normal 2013-2016  . Thrombocytopenia (HCC)    mild, chronic  . Vitamin D deficiency    Past Surgical History:  Procedure Laterality Date  . CHOLECYSTECTOMY  2007  . [place]  . SHOULDER SURGERY  1997   Family History  Problem Relation Age of  Onset  . Mental illness Mother   . Huntington's disease Mother   . Mental illness Father   . Hypertension Father   . Diabetes Father   . Hyperlipidemia Father   . Mental illness Sister   . Mental illness Brother   . Thyroid cancer Maternal Aunt   . Mental illness Maternal Aunt   . Mental illness Maternal Uncle   . Mental illness Paternal Aunt   . Mental illness Paternal Uncle   . Mental illness Maternal Grandmother   . Pancreatic cancer Maternal Grandfather   . Mental illness Maternal Grandfather   . Mental illness Paternal Grandmother   . Mental illness Paternal Grandfather    Allergies as of 07/18/2018   No Known Allergies     Medication List        Accurate as of 07/18/18  8:57 AM. Always use your most recent med list.          QUEtiapine 100 MG tablet Commonly known as:  SEROQUEL Take 1 tablet (100 mg total) by mouth  at bedtime.   Vitamin D 2000 units Caps       All past medical history, surgical history, allergies, family history, immunizations andmedications were updated in the EMR today and reviewed under the history and medication portions of their EMR.     ROS: Negative, with the exception of above mentioned in HPI   Objective:  BP 132/85 (BP Location: Right Arm, Patient Position: Sitting, Cuff Size: Normal)   Pulse 71   Temp 98.2 F (36.8 C)   Resp 20   Ht 6' 2"  (1.88 m)   Wt 187 lb 4 oz (84.9 kg)   SpO2 99%   BMI 24.04 kg/m  Body mass index is 24.04 kg/m. Gen: Afebrile. No acute distress.  HENT: AT. . MMM.  Eyes:Pupils Equal Round Reactive to light, Extraocular movements intact,  Conjunctiva without redness, discharge or icterus. Neck/lymp/endocrine: Supple,no lymphadenopathy CV: RRR no murmur, no edema, +2/4 P posterior tibialis pulses Chest: CTAB, no wheeze or crackles Skin: no rashes, purpura or petechiae.  Neuro:  Normal gait. PERLA. EOMi. Alert. Oriented.  Psych: Normal affect, dress and demeanor. Normal speech. Normal thought content and judgment. No exam data present No results found. No results found for this or any previous visit (from the past 24 hour(s)).  Assessment/Plan: Chris Robertson is a 48 y.o. male present for OV for  fatigue/Muscle weakness (generalized)/Dizziness He continues to improve. Poss heat exhaustion or dehydration suspected.  Labs were normal with the exception of mild elevation in bilirubin (Gliberts disease). He has mild signs of allergies on exam today. Encouraged him to start daily flonase for next few weeks.  Continue hydration techniques discussed.  F/U PRN   Reviewed expectations re: course of current medical issues.  Discussed self-management of symptoms.  Outlined signs and symptoms indicating need for more acute intervention.  Patient verbalized understanding and all questions were answered.  Patient received an After-Visit  Summary.    No orders of the defined types were placed in this encounter.    Note is dictated utilizing voice recognition software. Although note has been proof read prior to signing, occasional typographical errors still can be missed. If any questions arise, please do not hesitate to call for verification.   electronically signed by:  Howard Pouch, DO  [place]

## 2018-07-18 NOTE — Patient Instructions (Signed)
I believe you had heat exhaustion. Your sinus is mildly inflamed, but do not appear infectious. Try using OTC flonase for a few weeks, until ragweed season is over.    Heat Exhaustion Information WHAT IS HEAT EXHAUSTION? Heat exhaustion happens when your body gets overheated from hot weather or from exercise. Heat exhaustion can lead to heat stroke, a life-threatening condition that requires emergency care. Heat exhaustion is more likely to develop when:  You are exercising or being active.  You are in hot or humid weather.  You are in bright sunshine.  You are not drinking enough water.  WHO IS AT RISK FOR THIS CONDITION? This condition is more likely to develop in:  People who exercise in hot or humid weather.  People who exercise beyond their fitness level.  People who wear clothing that does not allow sweat to evaporate.  People who are dehydrated.  People who drink a lot of alcoholic beverages or beverages that have caffeine. This can lead to dehydration.  People who are age 17 or older.  Children.  People who have a medical condition such as heart disease, poor circulation, sickle cell disease, or high blood pressure.  People who have a fever.  People who are very overweight (obese).  WHAT ARE THE SYMPTOMS OF THIS CONDITION? Symptoms of heat exhaustion include:  Heavy sweating along with feeling weak, dizzy, light-headed, and nauseous.  Rapid heartbeat.  Headache.  Urine that is darker than normal.  Muscle cramps, such as in the leg or side (flank).  Moist, cool, and clammy skin.  Fatigue.  Thirst.  Confusion.  Fainting.  WHAT SHOULD I DO IF I THINK I HAVE THIS CONDITION? If you think that you have heat exhaustion, call your health care provider. Follow his or her instructions. You should also:  Call a friend or a family member and ask him or her to stay with you.  Move to a cooler location, such as: ? Into the shade. ? In front of a  fan. ? An air-conditioned space.  Lie down and rest.  Slowly drink nonalcoholic, caffeine-free fluids.  Take off tight clothing or extra clothing.  Take a cool bath or shower, if possible. If you do not have access to a bath or shower, dab or mist cool water on your skin.  WHY IS IT IMPORTANT TO TREAT THIS CONDITION? It is important to take care of yourself and treat heat exhaustion as soon as possible. Untreated heat exhaustion can turn into heat stroke, which is a life-threatening condition that requires urgent medical treatment. HOW CAN I PREVENT THIS CONDITION? To prevent this condition:  Drink enough fluid to keep your urine clear or pale yellow. This helps your body to sweat properly.  Avoid outdoor activities on very hot or humid days.  Do not exercise or do other physical activity when you are not feeling well.  Take breaks often during physical activity.  Wear light-colored, loose-fitting, and lightweight clothing when it is hot outside.  Wear a hat and use sunscreen when exercising outdoors.  Avoid being outside during the hottest times of the day.  Check with your health care provider before you start any new activity, especially if you take medicine or have a medical condition.  Start any new activity slowly and work up to your fitness level.  HOW CAN I HELP TO PROTECT ELDERLY RELATIVES AND NEIGHBORS FROM THIS CONDITION? People who are age 83 or older are at greater risk for heat exhaustion. Their bodies have  a harder time adjusting to heat. They are also more likely to have a medical condition or be on medicines that increase their risk for heat exhaustion. They may get heat exhaustion indoors if the heat is high for several days. You can help to protect them during hot weather by:  Checking on them two or more times each day.  Making sure that they are drinking plenty of cool, nonalcoholic, and caffeine-free fluids.  Making sure that they use their air  conditioner.  Taking them to a location where air conditioning is available.  Talking with their health care provider about their medical needs, medicines, and fluid requirements.  SEEK MEDICAL CARE IF:  Your symptoms last longer than 30 minutes.  SEEK IMMEDIATE MEDICAL CARE IF:  You have any symptoms of heat stroke. These include: ? Fever. ? Vomiting. ? Red skin. ? Inability to sweat, resulting in hot, dry skin. ? Excessive thirst. ? Rapid breathing. ? Headache. ? Confusion or disorientation. ? Fainting. ? Seizures. These symptoms may represent a serious problem that is an emergency. Do not wait to see if the symptoms will go away. Get medical help right away. Call your local emergency services (911 in the [place] Do not drive yourself to the hospital. This information is not intended to replace advice given to you by your health care provider. Make sure you discuss any questions you have with your health care provider. Document Released: 07/25/2008 Document Revised: 05/05/2016 Document Reviewed: 02/06/2016 Elsevier Interactive Patient Education  [employer].

## 2018-07-19 ENCOUNTER — Ambulatory Visit: Payer: BLUE CROSS/BLUE SHIELD | Admitting: Family Medicine

## 2018-07-22 ENCOUNTER — Encounter: Payer: Self-pay | Admitting: Family Medicine

## 2018-07-23 ENCOUNTER — Encounter: Payer: Self-pay | Admitting: *Deleted

## 2018-07-24 ENCOUNTER — Encounter: Payer: Self-pay | Admitting: *Deleted

## 2018-07-24 ENCOUNTER — Telehealth: Payer: Self-pay | Admitting: Family Medicine

## 2018-07-24 NOTE — Telephone Encounter (Signed)
Please advise pt to go to an UC or ED when this occurs so that test can be run when he has experiencing his symptoms.

## 2018-07-24 NOTE — Telephone Encounter (Signed)
Patient advised to go to UC or ED at time of episode if it occurs again.

## 2018-09-01 ENCOUNTER — Other Ambulatory Visit: Payer: Self-pay | Admitting: Family Medicine

## 2018-09-01 DIAGNOSIS — F4001 Agoraphobia with panic disorder: Secondary | ICD-10-CM

## 2018-09-01 DIAGNOSIS — F339 Major depressive disorder, recurrent, unspecified: Secondary | ICD-10-CM

## 2018-09-03 ENCOUNTER — Encounter: Payer: Self-pay | Admitting: Family Medicine

## 2018-09-03 ENCOUNTER — Ambulatory Visit: Payer: BLUE CROSS/BLUE SHIELD | Admitting: Family Medicine

## 2018-09-03 DIAGNOSIS — F4001 Agoraphobia with panic disorder: Secondary | ICD-10-CM

## 2018-09-03 DIAGNOSIS — F339 Major depressive disorder, recurrent, unspecified: Secondary | ICD-10-CM

## 2018-09-03 MED ORDER — QUETIAPINE FUMARATE 100 MG PO TABS: 100.0000 mg | ORAL_TABLET | Freq: Every day | ORAL | 5 refills | Status: DC

## 2018-09-03 MED ORDER — BUPROPION HCL ER (SR) 150 MG PO TB12: ORAL_TABLET | ORAL | 5 refills | Status: DC

## 2018-09-03 NOTE — Patient Instructions (Signed)
If doing well f/u 6 months. Sooner if needed.  I have called in the refills on seroquel and called in the Wellbutrin for you to start as the label reads (if you decide). I think you would benefit from the extra coverage.  If you decide you want to try therapy- just call in and I would be happy to refer you. Keep your head up.    Major Depressive Disorder, Adult Major depressive disorder (MDD) is a mental health condition. MDD often makes you feel sad, hopeless, or helpless. MDD can also cause symptoms in your body. MDD can affect your:  Work.  School.  Relationships.  Other normal activities.  MDD can range from mild to very bad. It may occur once (single episode MDD). It can also occur many times (recurrent MDD). The main symptoms of MDD often include:  Feeling sad, depressed, or irritable most of the time.  Loss of interest.  MDD symptoms also include:  Sleeping too much or too little.  Eating too much or too little.  A change in your weight.  Feeling tired (fatigue) or having low energy.  Feeling worthless.  Feeling guilty.  Trouble making decisions.  Trouble thinking clearly.  Thoughts of suicide or harming others.  Feeling weak.  Feeling agitated.  Keeping yourself from being around other people (isolation).  Follow these instructions at home: Activity  Do these things as told by your doctor: ? Go back to your normal activities. ? Exercise regularly. ? Spend time outdoors. Alcohol  Talk with your doctor about how alcohol can affect your antidepressant medicines.  Do not drink alcohol. Or, limit how much alcohol you drink. ? This means no more than 1 drink a day for nonpregnant women and 2 drinks a day for men. One drink equals one of these:  12 oz of beer.  5 oz of wine.  1 oz of hard liquor. General instructions  Take over-the-counter and prescription medicines only as told by your doctor.  Eat a healthy diet.  Get plenty of  sleep.  Find activities that you enjoy. Make time to do them.  Think about joining a support group. Your doctor may be able to suggest a group for you.  Keep all follow-up visits as told by your doctor. This is important. Where to find more information:  [employer] on Mental Illness: ? www.nami.[place]: ? https://carter.com/  National Suicide Prevention Lifeline: ? 562-261-1971. This is free, 24-hour help. Contact a doctor if:  Your symptoms get worse.  You have new symptoms. Get help right away if:  You self-harm.  You see, hear, taste, smell, or feel things that are not present (hallucinate). If you ever feel like you may hurt yourself or others, or have thoughts about taking your own life, get help right away. You can go to your nearest emergency department or call:  Your local emergency services (911 in the [place]  A suicide crisis helpline, such as the National Suicide Prevention Lifeline: ? (613)707-3973. This is open 24 hours a day.  This information is not intended to replace advice given to you by your health care provider. Make sure you discuss any questions you have with your health care provider. Document Released: 09/27/2015 Document Revised: 07/02/2016 Document Reviewed: 07/02/2016 Elsevier Interactive Patient Education  2017 [employer].

## 2018-09-03 NOTE — Progress Notes (Signed)
Patient ID: Chris Robertson, male   DOB: 1970-12-14, 47 y.o.   MRN: [id number]      Patient ID: Chris Robertson, male  DOB: 1971-10-23, 47 y.o.   MRN: [id number] Patient Care Team    Relationship Specialty Notifications Start End  Ma Hillock, DO PCP - General Family Medicine  05/05/16     Subjective:  Chris Robertson is a 47 y.o.  male present for follow up on anxiety and depression.   Panic/anxiety/depression:  Pt reports he has had a set back. He reports it is still surrounding his family and their lack of assistance with is mothers care (Huntingtons disease). 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   Prior note Pt reports his life is worse over the last 6 mos. He states he got "caught up in a war between his mother and sister." He was the peace maker between the two.   Prior note:  Pt reports he feels worse. No motivation for studies or self. He started a project for his sister and quit, he does not know why. He is unable to explain the circumstances or feelings surrounding his current state, other than  he "felt like I was walking in the ocean, doing ok, and then fell off a ledge."  He has a new job for [employer]. He is getting many "steps" in while working. He wants to feel better, but is not sure if the medication is the cause of him feeling this way. He does not want to start a new medication.   Prior note. Patient presents for follow-up on his anxiety and depression today. He states he is doing well on the Seroquel 100 mg daily at bedtime. He is taking this medication around 7:53 PM at night. He still having some difficulty falling asleep, sometimes not able to fall asleep until 11 PM. He does have to get up early for his job at 5 AM, so he has concerns about any additional medications. Overall he feels he is doing well on this medication. He is attempting become more social, and  exercise more. He just finished some summer classes, and is registered for fall classes to further his education. He still works full-time and takes care of his mother as well.   Depression screen [facility] 2/9 09/03/2018 07/08/2018 03/01/2018 09/03/2017 06/19/2017  Decreased Interest 2 - 1 1 1   Down, Depressed, Hopeless 1 0 2 3 1   PHQ - 2 Score 3 0 3 4 2   Altered sleeping 2 1 2 2 2   Tired, decreased energy 1 0 2 3 1   Change in appetite 2 0 0 0 0  Feeling bad or failure about yourself  0 - 0 - 0  Trouble concentrating 2 0 0 - 1  Moving slowly or fidgety/restless 2 0 1 1 1   Suicidal thoughts 0 0 0 0 0  PHQ-9 Score 12 1 8 10 7   Difficult doing work/chores Somewhat difficult Not difficult at all Somewhat difficult - Somewhat difficult     GAD 7 : Generalized Anxiety Score 09/03/2018 03/01/2018 09/03/2017 06/19/2017  Nervous, Anxious, on Edge 2 2 1 1   Control/stop worrying 1 0 1 1  Worry too much - different things 1 0 1 1  Trouble relaxing 2 2 1 2   Restless 2 2 2 2   Easily annoyed or irritable 1 1 1 1   Afraid -  awful might happen 0 0 1 0  Total GAD 7 Score 9 7 8 8   Anxiety Difficulty Very difficult Somewhat difficult Somewhat difficult Somewhat difficult   Mood disorder: negative, only secondary to #2 --> no. 7 yes in #1, and #3 with moderate   Immunization History  Administered Date(s) Administered  . Influenza, Seasonal, Injecte, Preservative Fre 10/13/2016  . Influenza,inj,Quad PF,6+ Mos 10/13/2016, 06/27/2017  . Influenza-Unspecified 07/31/2015, 07/18/2018  . Td 02/15/2010  . Tdap 10/30/2013    Past Medical History:  Diagnosis Date  . Anxiety   . Aortic regurgitation 12/05/2011  . Chicken pox   . Depression   . Echocardiogram abnormal 12/05/2011   Mild MVP, MR, AR  . Gilbert disease   . Hearing impaired    age 36  . Heart murmur    since childhood  . Kidney stone   . Mitral regurgitation   . MVP (mitral valve prolapse)   . Panic disorder 2015  . Spermatocele 08/2012    hyrocele bilateral and spermatocele left; PSA normal 2013-2016  . Thrombocytopenia (HCC)    mild, chronic  . Vitamin D deficiency    No Known Allergies Past Surgical History:  Procedure Laterality Date  . CHOLECYSTECTOMY  2007  . [place]  . SHOULDER SURGERY  1997   Family History  Problem Relation Age of Onset  . Mental illness Mother   . Huntington's disease Mother   . Mental illness Father   . Hypertension Father   . Diabetes Father   . Hyperlipidemia Father   . Mental illness Sister   . Mental illness Brother   . Thyroid cancer Maternal Aunt   . Mental illness Maternal Aunt   . Mental illness Maternal Uncle   . Mental illness Paternal Aunt   . Mental illness Paternal Uncle   . Mental illness Maternal Grandmother   . Pancreatic cancer Maternal Grandfather   . Mental illness Maternal Grandfather   . Mental illness Paternal Grandmother   . Mental illness Paternal Grandfather    Social History   Socioeconomic History  . Marital status: Single    Spouse name: Not on file  . Number of children: Not on file  . Years of education: Not on file  . Highest education level: Not on file  Occupational History  . Not on file  Social Needs  . Financial resource strain: Not on file  . Food insecurity:    Worry: Not on file    Inability: Not on file  . Transportation needs:    Medical: Not on file    Non-medical: Not on file  Tobacco Use  . Smoking status: Never Smoker  . Smokeless tobacco: Never Used  Substance and Sexual Activity  . Alcohol use: Yes    Alcohol/week: 2.0 standard drinks    Types: 2 Glasses of wine per week  . Drug use: No  . Sexual activity: Never  Lifestyle  . Physical activity:    Days per week: Not on file    Minutes per session: Not on file  . Stress: Not on file  Relationships  . Social connections:    Talks on phone: Not on file    Gets together: Not on file    Attends religious service: Not on file    Active member  of club or organization: Not on file    Attends meetings of clubs or organizations: Not on file    Relationship status: Not on file  . Intimate  partner violence:    Fear of current or ex partner: Not on file    Emotionally abused: Not on file    Physically abused: Not on file    Forced sexual activity: Not on file  Other Topics Concern  . Not on file  Social History Narrative   Single. No children.    Associates degree. Works full time as Financial trader for [employer].   No tobacco, drug use. Occasional ETOH.    Drink caffeine    Wears seatbelt, bike helmet   Bilateral hearing aids   Smoke detector in the home.    Feels safe in relationships.    Allergies as of 09/03/2018   No Known Allergies     Medication List        Accurate as of 09/03/18  8:43 AM. Always use your most recent med list.          QUEtiapine 100 MG tablet Commonly known as:  SEROQUEL Take 1 tablet (100 mg total) by mouth at bedtime.   Vitamin D 2000 units Caps        Recent Results (from the past 2160 hour(s))  CBC     Status: None   Collection Time: 07/08/18 11:08 AM  Result Value Ref Range   WBC 7.8 4.0 - 10.5 K/uL   RBC 5.14 4.22 - 5.81 Mil/uL   Platelets 163.0 150.0 - 400.0 K/uL   Hemoglobin 16.8 13.0 - 17.0 g/dL   HCT 47.2 39.0 - 52.0 %   MCV 91.8 78.0 - 100.0 fl   MCHC 35.6 30.0 - 36.0 g/dL   RDW 13.0 11.5 - 15.5 %  Comp Met (CMET)     Status: Abnormal   Collection Time: 07/08/18 11:08 AM  Result Value Ref Range   Sodium 139 135 - 145 mEq/L   Potassium 4.2 3.5 - 5.1 mEq/L   Chloride 104 96 - 112 mEq/L   CO2 28 19 - 32 mEq/L   Glucose, Bld 88 70 - 99 mg/dL   BUN 20 6 - 23 mg/dL   Creatinine, Ser 1.01 0.40 - 1.50 mg/dL   Total Bilirubin 1.8 (H) 0.2 - 1.2 mg/dL   Alkaline Phosphatase 88 39 - 117 U/L   AST 22 0 - 37 U/L   ALT 26 0 - 53 U/L   Total Protein 7.1 6.0 - 8.3 g/dL   Albumin 4.8 3.5 - 5.2 g/dL   Calcium 9.9 8.4 - 10.5 mg/dL   GFR 84.16 >60.00 mL/min  CK (Creatine Kinase)      Status: None   Collection Time: 07/08/18 11:08 AM  Result Value Ref Range   Total CK 81 7 - 232 U/L  Magnesium     Status: None   Collection Time: 07/08/18 11:08 AM  Result Value Ref Range   Magnesium 2.3 1.5 - 2.5 mg/dL     ROS: 14 pt review of systems performed and negative (unless mentioned in an HPI)  Objective: BP 108/67 (BP Location: Left Arm, Patient Position: Sitting, Cuff Size: Normal)   Pulse 75   Resp 16   Ht 6' 2"  (1.88 m)   Wt 188 lb (85.3 kg)   SpO2 98%   BMI 24.14 kg/m  Gen: Afebrile. No acute distress. Nontoxic. Pleasant caucasian male.  HENT: AT. [place]. MMM.  Eyes:Pupils Equal Round Reactive to light, Extraocular movements intact,  Conjunctiva without redness, discharge or icterus. Psych: Normal affect, dress and demeanor. Normal speech. Normal thought content and judgment.  Assessment/plan: Chris Robertson is  a 47 y.o. male present for new pt establishment with acute complaint.  Depression/Anxiety disorder, unspecified anxiety disorder type - worsening depression and anxiety by screening tools and personal account. Lengthy discussion with Chris Robertson again today surrounding therapy. I am not a therapist, therefore can only give some advice but not at the benefit a therapist would be able. Working with a therapist they may discover a technique for Chris Robertson to stop expecting other people to change (his family) and be able to control his response and the affect their lack of help and change has on is life. He is burdened with a job he is not excited about and the care for his ailing mother.  She is an ALF, which has helped but he still needs to drive her to appts. Since her docs are in [place]. She does not want to switch docs and he does not want to force her to do so.  - he declines referral to psychology today.  - Continue Seroquel 100 mg nightly (higher dose makes sedated). Refills provided today. refilled and provided today.   - start Wellbutrin - he is uncertain if he wants  to start, but will think about it. Medcine called in for him.  - prescribed exercise at least 150 minutes a week (outside of his steps at work).  He lost momentum on this when he suffered from dehydration and had dizziness.  - Follow-up 6 months, sooner if needed.  > 25 minutes spent with patient, >50% of time spent face to face counseling   Electronically signed by: Howard Pouch, [place]

## 2018-09-23 ENCOUNTER — Encounter: Payer: Self-pay | Admitting: Family Medicine

## 2018-09-23 ENCOUNTER — Ambulatory Visit: Payer: BLUE CROSS/BLUE SHIELD | Admitting: Family Medicine

## 2018-09-23 VITALS — BP 114/78 | HR 84 | Temp 98.2°F | Resp 16 | Ht 74.0 in | Wt 182.0 lb

## 2018-09-23 DIAGNOSIS — M62838 Other muscle spasm: Secondary | ICD-10-CM

## 2018-09-23 DIAGNOSIS — R42 Dizziness and giddiness: Secondary | ICD-10-CM | POA: Diagnosis not present

## 2018-09-23 DIAGNOSIS — R5383 Other fatigue: Secondary | ICD-10-CM

## 2018-09-23 LAB — CORTISOL: Cortisol, Plasma: 12 ug/dL

## 2018-09-23 LAB — URINALYSIS, ROUTINE W REFLEX MICROSCOPIC
BILIRUBIN URINE: NEGATIVE
Hgb urine dipstick: NEGATIVE
KETONES UR: NEGATIVE
LEUKOCYTES UA: NEGATIVE
NITRITE: NEGATIVE
PH: 5.5 (ref 5.0–8.0)
Specific Gravity, Urine: 1.015 (ref 1.000–1.030)
TOTAL PROTEIN, URINE-UPE24: NEGATIVE
Urine Glucose: NEGATIVE
Urobilinogen, UA: 0.2 (ref 0.0–1.0)

## 2018-09-23 LAB — BASIC METABOLIC PANEL
BUN: 21 mg/dL (ref 6–23)
CO2: 29 mEq/L (ref 19–32)
Calcium: 9.9 mg/dL (ref 8.4–10.5)
Chloride: 103 mEq/L (ref 96–112)
Creatinine, Ser: 1.05 mg/dL (ref 0.40–1.50)
GFR: 80.4 mL/min (ref >60.00)
Glucose, Bld: 69 mg/dL — ABNORMAL LOW (ref 70–99)
POTASSIUM: 3.7 meq/L (ref 3.5–5.1)
SODIUM: 141 meq/L (ref 135–145)

## 2018-09-23 LAB — CBC WITH DIFFERENTIAL/PLATELET
Basophils Absolute: 0 10*3/uL (ref 0.0–0.1)
Basophils Relative: 0.5 % (ref 0.0–3.0)
EOS ABS: 0.1 10*3/uL (ref 0.0–0.7)
Eosinophils Relative: 1.2 % (ref 0.0–5.0)
HCT: 48.7 % (ref 39.0–52.0)
Hemoglobin: 17 g/dL (ref 13.0–17.0)
LYMPHS ABS: 1.5 10*3/uL (ref 0.7–4.0)
Lymphocytes Relative: 16.5 % (ref 12.0–46.0)
MCHC: 34.9 g/dL (ref 30.0–36.0)
MCV: 93.5 fl (ref 78.0–100.0)
Monocytes Absolute: 0.9 10*3/uL (ref 0.1–1.0)
Monocytes Relative: 10.5 % (ref 3.0–12.0)
NEUTROS ABS: 6.4 10*3/uL (ref 1.4–7.7)
NEUTROS PCT: 71.3 % (ref 43.0–77.0)
Platelets: 178 10*3/uL (ref 150.0–400.0)
RBC: 5.21 Mil/uL (ref 4.22–5.81)
RDW: 12.7 % (ref 11.5–15.5)
WBC: 9 10*3/uL (ref 4.0–10.5)

## 2018-09-23 LAB — TSH: TSH: 2.68 u[IU]/mL (ref 0.35–4.50)

## 2018-09-23 LAB — HEMOGLOBIN A1C: Hgb A1c MFr Bld: 4.8 % (ref 4.6–6.5)

## 2018-09-23 LAB — VITAMIN B12: Vitamin B-12: 490 pg/mL (ref 211–911)

## 2018-09-23 LAB — VITAMIN D 25 HYDROXY (VIT D DEFICIENCY, FRACTURES): VITD: 34.56 ng/mL (ref 30.00–100.00)

## 2018-09-23 NOTE — Progress Notes (Signed)
Chris Robertson , 09-Jul-1971, 47 y.o., male MRN: [id number] Patient Care Team    Relationship Specialty Notifications Start End  Ma Hillock, DO PCP - General Family Medicine  05/05/16     Chief Complaint  Patient presents with  . Spasms    back spasm, dry mouth, fatigue     Subjective: Chris Robertson is a 47 y.o. male present for back spasms, dry mouth, leg weakness and fatigue. He feels his nail beds are pale.  He denies chest pain, palpitations, shortness of breath, dysuria, urinary frequency, Bowel changes, BPR  or syncope. He has had 2 episodes after exertion the last 4 months that caused him to exquisitely lightheaded and fatigued. He has been drinking at least 80 ounces of water now. He still has symptoms. He has had a normal CK, Mag, blood glucose, CBC and chem panel. He has a h/o AR, MR, mild MVP by echo 2013.   Prior note:  follow up on headache and muscle weakness. He denies any further symptoms of dizziness or passing out. He has had occasional frontal headache, that can last a few hours are mild and go away on their own. He does endorse mild itchy water eyes and sneezing.  Prior note: Pt presents for an OV with complaints of an episode of generalized weakness on Saturday when walking on the trails for exercise. He reports he had breakfast, which was a bowl of cereal and toast before setting out to exercise. He had been drinking plenty of water. He ws a little half way into his walk when he felt dizzy, confused and he reports his heart rate went up. He felt a generalized muscle weakness and set down in the grass. A bystander gave him water and called 911. EMS checked a tracing, BG and BP, all of which were normal. He has not had repeat of symptoms since Saturday. He reports he has remained fatigued feeling. Pt has tried nothing to ease their symptoms. He denies fever, chills, nausea, vomit or recent illness. He does report a similar event many years ago, in which he was told his  electrolytes were abnormal. He endorses mildly concentrated urine and occasional muscle spasms.   Depression screen [facility] 2/9 09/03/2018 07/08/2018 03/01/2018 09/03/2017 06/19/2017  Decreased Interest 2 - 1 1 1   Down, Depressed, Hopeless 1 0 2 3 1   PHQ - 2 Score 3 0 3 4 2   Altered sleeping 2 1 2 2 2   Tired, decreased energy 1 0 2 3 1   Change in appetite 2 0 0 0 0  Feeling bad or failure about yourself  0 - 0 - 0  Trouble concentrating 2 0 0 - 1  Moving slowly or fidgety/restless 2 0 1 1 1   Suicidal thoughts 0 0 0 0 0  PHQ-9 Score 12 1 8 10 7   Difficult doing work/chores Somewhat difficult Not difficult at all Somewhat difficult - Somewhat difficult    No Known Allergies Social History   Tobacco Use  . Smoking status: Never Smoker  . Smokeless tobacco: Never Used  Substance Use Topics  . Alcohol use: Yes    Alcohol/week: 2.0 standard drinks    Types: 2 Glasses of wine per week   Past Medical History:  Diagnosis Date  . Anxiety   . Aortic regurgitation 12/05/2011  . Chicken pox   . Depression   . Echocardiogram abnormal 12/05/2011   Mild MVP, MR, AR  . Gilbert disease   . Hearing impaired  age 4  . Heart murmur    since childhood  . Kidney stone   . Mitral regurgitation   . MVP (mitral valve prolapse)   . Panic disorder 2015  . Spermatocele 08/2012   hyrocele bilateral and spermatocele left; PSA normal 2013-2016  . Thrombocytopenia (HCC)    mild, chronic  . Vitamin D deficiency    Past Surgical History:  Procedure Laterality Date  . CHOLECYSTECTOMY  2007  . [place]  . SHOULDER SURGERY  1997   Family History  Problem Relation Age of Onset  . Mental illness Mother   . Huntington's disease Mother   . Mental illness Father   . Hypertension Father   . Diabetes Father   . Hyperlipidemia Father   . Mental illness Sister   . Mental illness Brother   . Thyroid cancer Maternal Aunt   . Mental illness Maternal Aunt   . Mental illness Maternal  Uncle   . Mental illness Paternal Aunt   . Mental illness Paternal Uncle   . Mental illness Maternal Grandmother   . Pancreatic cancer Maternal Grandfather   . Mental illness Maternal Grandfather   . Mental illness Paternal Grandmother   . Mental illness Paternal Grandfather    Allergies as of 09/23/2018   No Known Allergies     Medication List        Accurate as of 09/23/18  9:32 AM. Always use your most recent med list.          buPROPion 150 MG 12 hr tablet Commonly known as:  WELLBUTRIN SR 1 tab daily for 3 days, then 1 tab BID.   fluticasone 50 MCG/ACT nasal spray Commonly known as:  FLONASE Place into both nostrils daily.   QUEtiapine 100 MG tablet Commonly known as:  SEROQUEL Take 1 tablet (100 mg total) by mouth at bedtime.   Vitamin D 50 MCG (2000 UT) Caps       All past medical history, surgical history, allergies, family history, immunizations andmedications were updated in the EMR today and reviewed under the history and medication portions of their EMR.     ROS: Negative, with the exception of above mentioned in HPI   Objective:  BP 114/78 (BP Location: Left Arm, Patient Position: Sitting, Cuff Size: Normal)   Pulse 84   Temp 98.2 F (36.8 C) (Oral)   Resp 16   Ht 6' 2"  (1.88 m)   Wt 182 lb (82.6 kg)   SpO2 99%   BMI 23.37 kg/m  Body mass index is 23.37 kg/m. Gen: Afebrile. No acute distress.  HENT: AT. [place]. MMM.  Eyes:Pupils Equal Round Reactive to light, Extraocular movements intact,  Conjunctiva without redness, discharge or icterus. Neck/lymp/endocrine: Supple,no lymphadenopathy CV: RRR no murmur, no edema, +2/4 P posterior tibialis pulses Chest: CTAB, no wheeze or crackles Skin: no rashes, purpura or petechiae.  Neuro:  Normal gait. PERLA. EOMi. Alert. Oriented.  Psych: Normal affect, dress and demeanor. Normal speech. Normal thought content and judgment.  No exam data present No results found. No results found for this or any  previous visit (from the past 24 hour(s)).  Assessment/Plan: [name] is a 47 y.o. male present for OV for  fatigue/Muscle weakness (generalized)/Dizziness - his symptoms are worsening again, symptoms have waxed and waned for > 10 weeks. Normal work up initially with CBC, CMP, CK. - Moving forward with rpt CBC, Iron panel, B12, Vit d, TSH, ANA, a1c, urinalysis, cortisol, bmp - will order  echo if labs w/out cause.  - consider genetic testing for huntington's disease (mother) - consider neuro or rheum for muscle weakness - continue to hydrate and start multivitamin.  - F/U dependent on labs.    Reviewed expectations re: course of current medical issues.  Discussed self-management of symptoms.  Outlined signs and symptoms indicating need for more acute intervention.  Patient verbalized understanding and all questions were answered.  Patient received an After-Visit Summary.    No orders of the defined types were placed in this encounter.  > 25 minutes spent with patient, >50% of time spent face to face counseling   Note is dictated utilizing voice recognition software. Although note has been proof read prior to signing, occasional typographical errors still can be missed. If any questions arise, please do not hesitate to call for verification.   electronically signed by:  Howard Pouch, DO  [place]

## 2018-09-23 NOTE — Patient Instructions (Signed)
We will test urine and blood today to try to find cause of your symptoms. I will look into the genetic testing for you.  If all labs normal, then we will order a echo of the heart.     Fatigue Fatigue is feeling tired all of the time, a lack of energy, or a lack of motivation. Occasional or mild fatigue is often a normal response to activity or life in general. However, long-lasting (chronic) or extreme fatigue may indicate an underlying medical condition. Follow these instructions at home: Watch your fatigue for any changes. The following actions may help to lessen any discomfort you are feeling:  Talk to your health care provider about how much sleep you need each night. Try to get the required amount every night.  Take medicines only as directed by your health care provider.  Eat a healthy and nutritious diet. Ask your health care provider if you need help changing your diet.  Drink enough fluid to keep your urine clear or pale yellow.  Practice ways of relaxing, such as yoga, meditation, massage therapy, or acupuncture.  Exercise regularly.  Change situations that cause you stress. Try to keep your work and personal routine reasonable.  Do not abuse illegal drugs.  Limit alcohol intake to no more than 1 drink per day for nonpregnant women and 2 drinks per day for men. One drink equals 12 ounces of beer, 5 ounces of wine, or 1 ounces of hard liquor.  Take a multivitamin, if directed by your health care provider.  Contact a health care provider if:  Your fatigue does not get better.  You have a fever.  You have unintentional weight loss or gain.  You have headaches.  You have difficulty: ? Falling asleep. ? Sleeping throughout the night.  You feel angry, guilty, anxious, or sad.  You are unable to have a bowel movement (constipation).  You skin is dry.  Your legs or another part of your body is swollen. Get help right away if:  You feel confused.  Your  vision is blurry.  You feel faint or pass out.  You have a severe headache.  You have severe abdominal, pelvic, or back pain.  You have chest pain, shortness of breath, or an irregular or fast heartbeat.  You are unable to urinate or you urinate less than normal.  You develop abnormal bleeding, such as bleeding from the rectum, vagina, nose, lungs, or nipples.  You vomit blood.  You have thoughts about harming yourself or committing suicide.  You are worried that you might harm someone else. This information is not intended to replace advice given to you by your health care provider. Make sure you discuss any questions you have with your health care provider. Document Released: 08/13/2007 Document Revised: 03/23/2016 Document Reviewed: 02/17/2014 Elsevier Interactive Patient Education  [employer].

## 2018-09-26 LAB — ANA, IFA COMPREHENSIVE PANEL
Anti Nuclear Antibody(ANA): NEGATIVE
ENA SM AB SER-ACNC: NEGATIVE AI
SM/RNP: NEGATIVE AI
SSA (RO) (ENA) ANTIBODY, IGG: NEGATIVE AI
SSB (LA) (ENA) ANTIBODY, IGG: NEGATIVE AI
Scleroderma (Scl-70) (ENA) Antibody, IgG: <1 AI
ds DNA Ab: 2 IU/mL

## 2018-09-26 LAB — IRON,TIBC AND FERRITIN PANEL
%SAT: 49 % — AB (ref 20–48)
Ferritin: 253 ng/mL (ref 38–380)
IRON: 162 ug/dL (ref 50–180)
TIBC: 334 ug/dL (ref 250–425)

## 2018-09-30 ENCOUNTER — Telehealth: Payer: Self-pay | Admitting: Family Medicine

## 2018-09-30 ENCOUNTER — Encounter: Payer: Self-pay | Admitting: Family Medicine

## 2018-09-30 DIAGNOSIS — M6281 Muscle weakness (generalized): Secondary | ICD-10-CM

## 2018-09-30 DIAGNOSIS — M62838 Other muscle spasm: Secondary | ICD-10-CM

## 2018-09-30 DIAGNOSIS — R42 Dizziness and giddiness: Secondary | ICD-10-CM

## 2018-09-30 DIAGNOSIS — R55 Syncope and collapse: Secondary | ICD-10-CM

## 2018-09-30 NOTE — Telephone Encounter (Signed)
The last lab ruled out an autoimmune cause for symptoms.  - I have placed additional labs- looking for infectious causes for him to have completed by lab appt only. Please schedule him  - I have also referred him to cardiology for evaluation and work up since his symptoms do come with exertion. I did order an echo that hopefully will be completed before his cardio appt to provide more cardiac information.

## 2018-09-30 NOTE — Telephone Encounter (Signed)
Detailed message left on voice mail for patient to return call to schedule lab appointment. Information and instructions were also given on voice mail. Okay per DPR.

## 2018-10-01 NOTE — Telephone Encounter (Signed)
Patient notified, he has scheduled requested appointments.

## 2018-10-02 ENCOUNTER — Other Ambulatory Visit (INDEPENDENT_AMBULATORY_CARE_PROVIDER_SITE_OTHER): Payer: BLUE CROSS/BLUE SHIELD

## 2018-10-02 DIAGNOSIS — M62838 Other muscle spasm: Secondary | ICD-10-CM | POA: Diagnosis not present

## 2018-10-02 DIAGNOSIS — R42 Dizziness and giddiness: Secondary | ICD-10-CM | POA: Diagnosis not present

## 2018-10-02 DIAGNOSIS — M6281 Muscle weakness (generalized): Secondary | ICD-10-CM

## 2018-10-03 ENCOUNTER — Other Ambulatory Visit: Payer: Self-pay

## 2018-10-03 ENCOUNTER — Ambulatory Visit (HOSPITAL_COMMUNITY): Payer: BLUE CROSS/BLUE SHIELD | Attending: Cardiovascular Disease

## 2018-10-03 DIAGNOSIS — M6281 Muscle weakness (generalized): Secondary | ICD-10-CM | POA: Insufficient documentation

## 2018-10-03 DIAGNOSIS — R42 Dizziness and giddiness: Secondary | ICD-10-CM | POA: Insufficient documentation

## 2018-10-03 DIAGNOSIS — R55 Syncope and collapse: Secondary | ICD-10-CM | POA: Diagnosis not present

## 2018-10-03 HISTORY — PX: TRANSTHORACIC ECHOCARDIOGRAM: SHX275

## 2018-10-03 LAB — EPSTEIN-BARR VIRUS VCA ANTIBODY PANEL
EBV NA IgG: <18 U/mL
EBV VCA IgG: <18 U/mL
EBV VCA IgM: <36 U/mL

## 2018-10-03 LAB — HEPATITIS PANEL, ACUTE
HEP B S AG: NONREACTIVE
Hep A IgM: NONREACTIVE
Hep B C IgM: NONREACTIVE
Hepatitis C Ab: NONREACTIVE
SIGNAL TO CUT-OFF: 0.01 (ref <1.00)

## 2018-10-03 LAB — HIV ANTIBODY (ROUTINE TESTING W REFLEX): HIV 1&2 Ab, 4th Generation: NONREACTIVE

## 2018-10-03 LAB — PTH, INTACT AND CALCIUM
Calcium: 9.8 mg/dL (ref 8.6–10.3)
PTH: 30 pg/mL (ref 14–64)

## 2018-10-11 ENCOUNTER — Ambulatory Visit: Payer: BLUE CROSS/BLUE SHIELD | Admitting: Cardiology

## 2018-10-11 ENCOUNTER — Encounter: Payer: Self-pay | Admitting: Cardiology

## 2018-10-11 VITALS — BP 116/70 | HR 72 | Ht 74.0 in | Wt 183.0 lb

## 2018-10-11 DIAGNOSIS — R000 Tachycardia, unspecified: Secondary | ICD-10-CM

## 2018-10-11 DIAGNOSIS — R0600 Dyspnea, unspecified: Secondary | ICD-10-CM

## 2018-10-11 DIAGNOSIS — R55 Syncope and collapse: Secondary | ICD-10-CM | POA: Diagnosis not present

## 2018-10-11 DIAGNOSIS — R0609 Other forms of dyspnea: Secondary | ICD-10-CM

## 2018-10-11 HISTORY — DX: Syncope and collapse: R55

## 2018-10-11 HISTORY — DX: Other forms of dyspnea: R06.09

## 2018-10-11 HISTORY — DX: Dyspnea, unspecified: R06.00

## 2018-10-11 HISTORY — DX: Tachycardia, unspecified: R00.0

## 2018-10-11 NOTE — Progress Notes (Signed)
PCP: Ma Hillock, DO  Clinic Note: Chief Complaint  Patient presents with  . New Patient (Initial Visit)  . Dizziness  . Loss of Consciousness    Nearly.   . Fatigue         HPI: Chris Robertson is a 47 y.o. male with a PMH notable for depression and panic disorder as well as "mild MVP by echo in 2013 who is being seen today for the evaluation of DIZZINESS, [place] at the request of Kuneff, Renee A, DO.  Holland Nickson was referred to cardiology for evaluation of episodic lightheadedness/near syncope and dizziness.  Was out walking trails for exercise and noticed generalized weakness.  He has been drinking water and had eaten breakfast.  Started getting dizzy about halfway through his walk.  He felt confused and his heart rate going fast.  They called EMS, BP, glucose and heart rate were normal.  Echocardiogram ordered (see below).  Has been referred for evaluation for possible ischemic heart changes or arrhythmia.  Recent Hospitalizations:   None  Studies Personally Reviewed - (if available, images/films reviewed: From Epic Chart or Care Everywhere)  2D Echocardiogram October 03, 2018: Normal LV size and function.  EF 55 to 60%.  Trivial late systolic mitral valve prolapse with trivial regurgitation.  Also trivial AI, TR and PR.  2D Echocardiogram February 2013 showed mild mitral prolapse and mitral regurgitation with mild aortic regurgitation.  Normal EF.  Interval History: Chris Robertson is a very pleasant young man who really does not have much in the way of any cardiac risk factors.  His mother has Huntington's, and he is just now got her into an assisted living location after caring for her for about 3 years.  He has had quite a bit of issues with depression and anxiety partly related to having to care for his mother and the stress of that. Chris Robertson had one episode in September where he described feeling flushed fatigued and dizzy while walking out on the [place] at [facility].  He usually is able to do the roughly [address] without any difficulty, but this time few months ago he was about halfway out, about to turn around when he started just really feeling poorly.  EMS evaluation did not show any untoward findings of hypotension or tachycardia.  He felt that his heart rate went fast during this episode, but not significantly fast.  He never really did lose consciousness and just simply went to lay down.  He indicated that he had been eating and drinking as much as usual, but it was a very hot day that day.  Nothing else was unusual at that day.  He did acknowledge that he felt much better after getting some drink and eat. Interestingly, he felt poorly after that episode for the next month or 2 and was not until he was halfway through his Thanksgiving weekend he started feeling better.  In the timeframe he did notice shortness of breath with exertion.  Since that episode he has not had any further symptoms.  He has no resting exertional dyspnea or chest pressure/tightness.  He does note that sometimes when he walks his heart rate goes up little faster than usual, but this was the only time that he ever felt symptomatic. No further episodes of syncope/near syncope or TIA/amaurosis fugax.  No chest pain or shortness of breath with rest or exertion. No PND, orthopnea or edema. No melena, hematochezia, hematuria, or epstaxis. No  claudication.  ROS: A comprehensive was performed. Review of Systems  Constitutional: Negative for malaise/fatigue.  HENT: Positive for hearing loss (has had "hair cell death" related hearing loss since age ~37 - wears hearing aids). Negative for congestion.   Respiratory: Negative for cough and shortness of breath.   Gastrointestinal: Negative for blood in stool, heartburn, melena and nausea.  Genitourinary: Negative for dysuria and hematuria.  Musculoskeletal: Negative for falls and joint pain.  Neurological: Negative for dizziness,  seizures and weakness.  Psychiatric/Behavioral: Positive for depression. The patient is nervous/anxious.        The symptoms seem to have been much worse over the last couple years, much better now.  He is about ready to move into his own apartment and get his own freedom.  His mother is now in a an assisted living, but he still goes out there several times a week.  He does not have the primary responsibility anymore though that is helping him a lot.  All other systems reviewed and are negative.   I have reviewed and (if needed) personally updated the patient's problem list, medications, allergies, past medical and surgical history, social and family history.   Past Medical History:  Diagnosis Date  . Anxiety   . Chicken pox   . Depression   . Gilbert disease   . Hearing impaired    age 47  . Heart murmur    since childhood  . Kidney stone   . Mild mitral valve prolapse 2013   Mild MVP with mild MR by echo in 2013, read as trivial in 2019.  Marland Kitchen Panic disorder 2015   With agoraphobia  . Spermatocele 08/2012   hyrocele bilateral and spermatocele left; PSA normal 2013-2016  . Thrombocytopenia (HCC)    mild, chronic  . Vitamin D deficiency     Past Surgical History:  Procedure Laterality Date  . CHOLECYSTECTOMY  2007  . [place]  . SHOULDER SURGERY  1997  . TRANSTHORACIC ECHOCARDIOGRAM  10/03/2018   Normal LV size and function.  EF 55 to 60%.  No regional wall motion normality.  Trivial late mitral prolapse with trivial regurgitation.  Also trivial AI, TR, PR.    Current Meds  Medication Sig  . Cholecalciferol (VITAMIN D) 2000 units CAPS   . fluticasone (FLONASE) 50 MCG/ACT nasal spray Place into both nostrils daily.  . QUEtiapine (SEROQUEL) 100 MG tablet Take 1 tablet (100 mg total) by mouth at bedtime.    No Known Allergies  Social History   Tobacco Use  . Smoking status: Never Smoker  . Smokeless tobacco: Never Used  Substance Use Topics  .  Alcohol use: Yes    Alcohol/week: 2.0 standard drinks    Types: 2 Glasses of wine per week  . Drug use: No   Social History   Social History Narrative   Single. No children.    Associates degree. Works full time as Financial trader for [employer].   No tobacco, drug use. Occasional ETOH.    Drink caffeine    Wears seatbelt, bike helmet   Bilateral hearing aids   Smoke detector in the home.    Feels safe in relationships.      Family History family history includes Depression in his mother; Diabetes in his father; Huntington's disease in his mother; Hyperlipidemia in his father; Hypertension in his father; Mental illness in his brother, father, maternal aunt, maternal grandfather, maternal grandmother, maternal uncle, mother, paternal aunt, paternal grandfather, paternal grandmother,  paternal uncle, and sister; Pancreatic cancer in his maternal grandfather; Thyroid cancer in his maternal aunt.  Wt Readings from Last 3 Encounters:  10/11/18 183 lb (83 kg)  09/23/18 182 lb (82.6 kg)  09/03/18 188 lb (85.3 kg)    PHYSICAL EXAM BP 116/70 (BP Location: Right Arm, Patient Position: Sitting, Cuff Size: Normal)   Pulse 72   Ht 6' 2"  (1.88 m)   Wt 183 lb (83 kg)   BMI 23.50 kg/m   Orthostatic VS for the past 24 hrs:  BP- Lying Pulse- Lying BP- Sitting Pulse- Sitting BP- Standing at 0 minutes Pulse- Standing at 0 minutes  10/11/18 0944 125/75 72 122/80 80 127/81 85  Physical Exam  Constitutional: He is oriented to person, place, and time. He appears well-developed and well-nourished. No distress.  HENT:  Head: Normocephalic and atraumatic.  Hearing aids in place  Eyes: Pupils are equal, round, and reactive to light. Conjunctivae and EOM are normal.  Neck: Normal range of motion. Neck supple. No hepatojugular reflux and no JVD present. Carotid bruit is not present.  Cardiovascular: Normal rate, regular rhythm, S1 normal, S2 normal and normal pulses.  No extrasystoles are present. PMI is not  displaced. Exam reveals distant heart sounds and a midsystolic click. Exam reveals no gallop and no S4.  No murmur heard. Systolic click versus gallop.  Pulmonary/Chest: Effort normal and breath sounds normal. No respiratory distress. He has no wheezes. He has no rales.  Abdominal: Soft. Bowel sounds are normal. He exhibits no distension. There is no abdominal tenderness. There is no rebound.  Musculoskeletal: Normal range of motion.        General: No edema.  Neurological: He is alert and oriented to person, place, and time. A cranial nerve deficit (hearing otw - grossly intact) is present.  Skin: Skin is warm and dry.  Psychiatric: He has a normal mood and affect. His behavior is normal. Judgment and thought content normal.  Vitals reviewed.    Adult ECG Report  Rate: 72 ;  Rhythm: normal sinus rhythm and Left axis deviation.  Otherwise normal intervals and durations.;   Narrative Interpretation: Relatively normal EKG   Other studies Reviewed: Additional studies/ records that were reviewed today include:  Recent Labs:  Lab Results  Component Value Date   CHOL 163 11/10/2015   HDL 43 11/10/2015   LDLCALC 103 11/10/2015   TRIG 87 11/10/2015   Lab Results  Component Value Date   CREATININE 1.05 09/23/2018   BUN 21 09/23/2018   NA 141 09/23/2018   K 3.7 09/23/2018   CL 103 09/23/2018   CO2 29 09/23/2018    ASSESSMENT / PLAN: Problem List Items Addressed This Visit    DOE (dyspnea on exertion)    Since his episode, he had noted some exertional dyspnea.  Hoping to feel more comfortable getting back into his exercise regimen. We will evaluate for baseline cardiovascular risk with a CPX (cardiopulmonary stress test) stress test which allow him to ride a bicycle for stress as opposed to walking on treadmill which he has not been able to do because of balance issues.      Relevant Orders   Cardiopulmonary exercise test   EKG 12-Lead   Near syncope - Primary    Unfortunately,  the one episode several months ago is hard to determine what the true etiology was.  I suspect that is probably related to dehydration and heat exhaustion.  Symptoms are more consistent with that than any type  of arrhythmia.  He did have sensation of his heart rate going faster and has had some exertional dyspnea.  We talked about doing a stress test, but he told me that he has balance issues walking on a treadmill.  I will therefore plan to do the alternative which be a CPX, bicycle stress test that would be a good baseline cardiovascular risk evaluation and also BL assess for exercise related tachycardia. He just had an echocardiogram done which did show mild mitral prolapse.  I do not think that is at all associated with the symptoms.  Just to make sure there is no arrhythmia complement, we talked about whether or not he should wear a monitor.  I think he is far enough out from his episode and has not had any further symptoms that we probably would not capture an thing.  We talked about using home monitoring system application:  Kardia" By [employer]. --> he will either do this versus going forward with purchasing an apple-watch IV.      Relevant Orders   Cardiopulmonary exercise test   Rapid heart beat    Not frequent - ?if related to exercise -->  Assess for exercise related tachycardia with CPX (Cardiopulmonary Exercise Test). Jodelle Red (by [employer] APP to monitor @ home -- can communicate with [place] if any Sx or concerning episodes occur.      Relevant Orders   Cardiopulmonary exercise test   EKG 12-Lead       I spent a total of 40 minutes with the patient and chart review. >  50% of the time was spent in direct patient consultation.   Current medicines are reviewed at length with the patient today.  (+/- concerns) n/a The following changes have been made:  n/a   Patient Instructions  Medication Instructions:  NOT NEEDED If you need a refill on your cardiac medications  before your next appointment, please call your pharmacy.   Lab work:  NOT NEEDED If you have labs (blood work) drawn today and your tests are completely normal, you will receive your results only by: Marland Kitchen MyChart Message (if you have MyChart) OR . A paper copy in the mail If you have any lab test that is abnormal or we need to change your treatment, we will call you to review the results.  Testing/Procedures: SCHEDULE AT [facility]  Your physician has recommended that you have a cardiopulmonary stress test (CPX).- USE BICYCLE CPX testing is a non-invasive measurement of heart and lung function. It replaces a traditional treadmill stress test. This type of test provides a tremendous amount of information that relates not only to your present condition but also for future outcomes. This test combines measurements of you ventilation, respiratory gas exchange in the lungs, electrocardiogram (EKG), blood pressure and physical response before, during, and following an exercise protocol.    Follow-Up: At [facility], you and your health needs are our priority.  As part of our continuing mission to provide you with exceptional heart care, we have created designated Provider Care Teams.  These Care Teams include your primary Cardiologist (physician) and Advanced Practice Providers (APPs -  Physician Assistants and Nurse Practitioners) who all work together to provide you with the care you need, when you need it. . Your physician recommends that you schedule a follow-up appointment in 2 [place] .   Any Other Special Instructions Will Be Listed Below (If Applicable).    DR  RECOMMENDS THAT YOU  PURCHASES  " Kardia" By [employer]. FROM THE  GOOGLE[employer]  APP PLAY STORE.   THE APP IS FREE , BUT THE  EQUIPMENT HAS A COST. IT IS A WAY FOR YOU TO OBTAIN A RECORDING OF YOUR HEART RATE . IT WILL BE A SHORT RHYTHM  STRIP THAT YOU CAN SHOW TO MEDICAL STAFF.   Studies Ordered:    Orders Placed This Encounter  Procedures  . Cardiopulmonary exercise test  . EKG 12-Lead      Glenetta Hew, M.D., M.S. Interventional Cardiologist   Pager # [phone] Phone # [phone] [address]. [place], [place] [address]   Thank you for choosing Heartcare at [facility]!!

## 2018-10-11 NOTE — Patient Instructions (Addendum)
Medication Instructions:  NOT NEEDED If you need a refill on your cardiac medications before your next appointment, please call your pharmacy.   Lab work:  NOT NEEDED If you have labs (blood work) drawn today and your tests are completely normal, you will receive your results only by: Marland Kitchen MyChart Message (if you have MyChart) OR . A paper copy in the mail If you have any lab test that is abnormal or we need to change your treatment, we will call you to review the results.  Testing/Procedures: SCHEDULE AT [facility]  Your physician has recommended that you have a cardiopulmonary stress test (CPX).- USE BICYCLE CPX testing is a non-invasive measurement of heart and lung function. It replaces a traditional treadmill stress test. This type of test provides a tremendous amount of information that relates not only to your present condition but also for future outcomes. This test combines measurements of you ventilation, respiratory gas exchange in the lungs, electrocardiogram (EKG), blood pressure and physical response before, during, and following an exercise protocol.    Follow-Up: At [facility], you and your health needs are our priority.  As part of our continuing mission to provide you with exceptional heart care, we have created designated Provider Care Teams.  These Care Teams include your primary Cardiologist (physician) and Advanced Practice Providers (APPs -  Physician Assistants and Nurse Practitioners) who all work together to provide you with the care you need, when you need it. . Your physician recommends that you schedule a follow-up appointment in 2 [place] .   Any Other Special Instructions Will Be Listed Below (If Applicable).    DR Ellyn Hack RECOMMENDS THAT YOU  PURCHASES  " Kardia" By [employer]. FROM THE  GOOGLE[employer]  APP PLAY STORE.   THE APP IS FREE , BUT THE  EQUIPMENT HAS A COST. IT IS A WAY FOR YOU TO OBTAIN A RECORDING OF YOUR HEART RATE . IT WILL  BE A SHORT RHYTHM  STRIP THAT YOU CAN SHOW TO MEDICAL STAFF.

## 2018-10-13 ENCOUNTER — Encounter: Payer: Self-pay | Admitting: Cardiology

## 2018-10-13 NOTE — Assessment & Plan Note (Signed)
Unfortunately, the one episode several months ago is hard to determine what the true etiology was.  I suspect that is probably related to dehydration and heat exhaustion.  Symptoms are more consistent with that than any type of arrhythmia.  He did have sensation of his heart rate going faster and has had some exertional dyspnea.  We talked about doing a stress test, but he told me that he has balance issues walking on a treadmill.  I will therefore plan to do the alternative which be a CPX, bicycle stress test that would be a good baseline cardiovascular risk evaluation and also BL assess for exercise related tachycardia. He just had an echocardiogram done which did show mild mitral prolapse.  I do not think that is at all associated with the symptoms.  Just to make sure there is no arrhythmia complement, we talked about whether or not he should wear a monitor.  I think he is far enough out from his episode and has not had any further symptoms that we probably would not capture an thing.  We talked about using home monitoring system application:  Kardia" By [employer]. --> he will either do this versus going forward with purchasing an apple-watch IV.

## 2018-10-13 NOTE — Assessment & Plan Note (Signed)
Not frequent - ?if related to exercise -->  Assess for exercise related tachycardia with CPX (Cardiopulmonary Exercise Test). Chris Robertson (by [employer] APP to monitor @ home -- can communicate with [place] if any Sx or concerning episodes occur.

## 2018-10-13 NOTE — Assessment & Plan Note (Signed)
Since his episode, he had noted some exertional dyspnea.  Hoping to feel more comfortable getting back into his exercise regimen. We will evaluate for baseline cardiovascular risk with a CPX (cardiopulmonary stress test) stress test which allow him to ride a bicycle for stress as opposed to walking on treadmill which he has not been able to do because of balance issues.

## 2018-10-21 ENCOUNTER — Encounter (HOSPITAL_COMMUNITY): Payer: BLUE CROSS/BLUE SHIELD

## 2018-12-13 ENCOUNTER — Ambulatory Visit: Payer: BLUE CROSS/BLUE SHIELD | Admitting: Cardiology

## 2018-12-23 ENCOUNTER — Encounter: Payer: Self-pay | Admitting: Family Medicine

## 2018-12-23 NOTE — Telephone Encounter (Signed)
Pt sent My Chart message stating he wanted to stop his Quetiapine due to it making his mouth dry at night and during the daytime. My Chart message sent back to patient letting him know that Dr Raoul Pitch is out of the office until 12/26/2018, message would be sent to her, and not to stop taking medications until instructions on how to stop were given to patient.   Please Advise

## 2018-12-26 ENCOUNTER — Telehealth: Payer: Self-pay | Admitting: Family Medicine

## 2018-12-26 NOTE — Telephone Encounter (Signed)
Called and gave patient detailed instructions on how to taper down Seroquel . Pt verbalized understanding and stated he would try the half tab nightly and see if that helps with the dry mouth.

## 2018-12-26 NOTE — Telephone Encounter (Signed)
If he still desires to taper down or off  On Seroquel, I would recommend he start by taking a 1/2 tab for 1 month and see how he feels. If still desiring to come of medication all together at that time- he can stop med in 4 weeks.

## 2018-12-26 NOTE — Telephone Encounter (Signed)
Pt stated he would like to decrease the dosage of QUEtiapine (SEROQUEL) 100 MG tablet to QUEtiapine (SEROQUEL) 50 MG tablet. Pt requests that the Rx for the decreased dosage be [facility] [place], [place] - [address] [place] [address] [place] [address] [phone] (Phone)  [phone] (Fax)

## 2018-12-27 MED ORDER — QUETIAPINE FUMARATE 50 MG PO TABS: 50.0000 mg | ORAL_TABLET | Freq: Every day | ORAL | 0 refills | Status: DC

## 2018-12-27 NOTE — Telephone Encounter (Signed)
Low dose called in . Please make pt aware

## 2018-12-27 NOTE — Telephone Encounter (Signed)
Pt would like a 19m tablet of Seroquel called into the pharmacy. He only has 3 tablets left of the 1036mbefore needing to pick up refill. I can call and cancel the old script and remaining refills. LOV 09/23/2018  NOV 03/04/2019 Refilled last on 09/03/2018 #30 x5 refills    Please advise on the number refills and quantity if this is okay

## 2018-12-27 NOTE — Telephone Encounter (Signed)
Pt sent my chart message making him aware

## 2018-12-27 NOTE — Addendum Note (Signed)
Addended by: Howard Pouch A on: 12/27/2018 04:02 PM   Modules accepted: Orders

## 2019-01-14 ENCOUNTER — Other Ambulatory Visit: Payer: Self-pay

## 2019-01-14 ENCOUNTER — Encounter: Payer: Self-pay | Admitting: Family Medicine

## 2019-01-14 ENCOUNTER — Ambulatory Visit: Payer: BLUE CROSS/BLUE SHIELD | Admitting: Family Medicine

## 2019-01-14 VITALS — BP 125/85 | HR 65 | Temp 98.2°F | Resp 16 | Ht 74.0 in | Wt 186.2 lb

## 2019-01-14 DIAGNOSIS — J0110 Acute frontal sinusitis, unspecified: Secondary | ICD-10-CM | POA: Diagnosis not present

## 2019-01-14 MED ORDER — FLUTICASONE PROPIONATE 50 MCG/ACT NA SUSP: 2.0000 | Freq: Every day | NASAL | 2 refills | Status: DC

## 2019-01-14 MED ORDER — DOXYCYCLINE HYCLATE 100 MG PO TABS: 100.0000 mg | ORAL_TABLET | Freq: Two times a day (BID) | ORAL | 0 refills | Status: DC

## 2019-01-14 NOTE — Patient Instructions (Signed)
Rest, hydrate.  + flonase, mucinex - plain, nasal saline flushes a few times a day.  doxy prescribed, take until completed.  If cough present it can last up to 6-8 weeks.  F/U 2 weeks of not improved.     Sinusitis, Adult Sinusitis is soreness and swelling (inflammation) of your sinuses. Sinuses are hollow spaces in the bones around your face. They are located:  Around your eyes.  In the middle of your forehead.  Behind your nose.  In your cheekbones. Your sinuses and nasal passages are lined with a fluid called mucus. Mucus drains out of your sinuses. Swelling can trap mucus in your sinuses. This lets germs (bacteria, virus, or fungus) grow, which leads to infection. Most of the time, this condition is caused by a virus. What are the causes? This condition is caused by:  Allergies.  Asthma.  Germs.  Things that block your nose or sinuses.  Growths in the nose (nasal polyps).  Chemicals or irritants in the air.  Fungus (rare). What increases the risk? You are more likely to develop this condition if:  You have a weak body defense system (immune system).  You do a lot of swimming or diving.  You use nasal sprays too much.  You smoke. What are the signs or symptoms? The main symptoms of this condition are pain and a feeling of pressure around the sinuses. Other symptoms include:  Stuffy nose (congestion).  Runny nose (drainage).  Swelling and warmth in the sinuses.  Headache.  Toothache.  A cough that may get worse at night.  Mucus that collects in the throat or the back of the nose (postnasal drip).  Being unable to smell and taste.  Being very tired (fatigue).  A fever.  Sore throat.  Bad breath. How is this diagnosed? This condition is diagnosed based on:  Your symptoms.  Your medical history.  A physical exam.  Tests to find out if your condition is short-term (acute) or long-term (chronic). Your doctor may: ? Check your nose for  growths (polyps). ? Check your sinuses using a tool that has a light (endoscope). ? Check for allergies or germs. ? Do imaging tests, such as an MRI or CT scan. How is this treated? Treatment for this condition depends on the cause and whether it is short-term or long-term.  If caused by a virus, your symptoms should go away on their own within 10 days. You may be given medicines to relieve symptoms. They include: ? Medicines that shrink swollen tissue in the nose. ? Medicines that treat allergies (antihistamines). ? A spray that treats swelling of the nostrils. ? Rinses that help get rid of thick mucus in your nose (nasal saline washes).  If caused by bacteria, your doctor may wait to see if you will get better without treatment. You may be given antibiotic medicine if you have: ? A very bad infection. ? A weak body defense system.  If caused by growths in the nose, you may need to have surgery. Follow these instructions at home: Medicines  Take, use, or apply over-the-counter and prescription medicines only as told by your doctor. These may include nasal sprays.  If you were prescribed an antibiotic medicine, take it as told by your doctor. Do not stop taking the antibiotic even if you start to feel better. Hydrate and humidify   Drink enough water to keep your pee (urine) pale yellow.  Use a cool mist humidifier to keep the humidity level in your  home above 50%.  Breathe in steam for 10-15 minutes, 3-4 times a day, or as told by your doctor. You can do this in the bathroom while a hot shower is running.  Try not to spend time in cool or dry air. Rest  Rest as much as you can.  Sleep with your head raised (elevated).  Make sure you get enough sleep each night. General instructions   Put a warm, moist washcloth on your face 3-4 times a day, or as often as told by your doctor. This will help with discomfort.  Wash your hands often with soap and water. If there is no  soap and water, use hand sanitizer.  Do not smoke. Avoid being around people who are smoking (secondhand smoke).  Keep all follow-up visits as told by your doctor. This is important. Contact a doctor if:  You have a fever.  Your symptoms get worse.  Your symptoms do not get better within 10 days. Get help right away if:  You have a very bad headache.  You cannot stop throwing up (vomiting).  You have very bad pain or swelling around your face or eyes.  You have trouble seeing.  You feel confused.  Your neck is stiff.  You have trouble breathing. Summary  Sinusitis is swelling of your sinuses. Sinuses are hollow spaces in the bones around your face.  This condition is caused by tissues in your nose that become inflamed or swollen. This traps germs. These can lead to infection.  If you were prescribed an antibiotic medicine, take it as told by your doctor. Do not stop taking it even if you start to feel better.  Keep all follow-up visits as told by your doctor. This is important. This information is not intended to replace advice given to you by your health care provider. Make sure you discuss any questions you have with your health care provider. Document Released: 04/03/2008 Document Revised: 03/18/2018 Document Reviewed: 03/18/2018 Elsevier Interactive Patient Education  2019 [employer].

## 2019-01-14 NOTE — Progress Notes (Signed)
Chris Robertson , [birth year], 48 y.o., male MRN: [id number] Patient Care Team    Relationship Specialty Notifications Start End  Ma Hillock, DO PCP - General Family Medicine  05/05/16     Chief Complaint  Patient presents with   Nasal Congestion    x6 days right behind and above patients eyes pt complains of facial pain. Denies fever, cough, SOB      Subjective: Pt presents for an OV with complaints of sinus pain of 6 days duration.  Associated symptoms include pain mid forehead, Sinus pressure and scratchy throat.  He denies fever, chills. Shortness of breath or cough. Denies travel or exposure to [place].  Pt has tried advil, rest, OTC med to ease their symptoms.   Depression screen [facility] 2/9 09/03/2018 07/08/2018 03/01/2018 09/03/2017 06/19/2017  Decreased Interest 2 - 1 1 1   Down, Depressed, Hopeless 1 0 2 3 1   PHQ - 2 Score 3 0 3 4 2   Altered sleeping 2 1 2 2 2   Tired, decreased energy 1 0 2 3 1   Change in appetite 2 0 0 0 0  Feeling bad or failure about yourself  0 - 0 - 0  Trouble concentrating 2 0 0 - 1  Moving slowly or fidgety/restless 2 0 1 1 1   Suicidal thoughts 0 0 0 0 0  PHQ-9 Score 12 1 8 10 7   Difficult doing work/chores Somewhat difficult Not difficult at all Somewhat difficult - Somewhat difficult    No Known Allergies Social History   Social History Narrative   Single. No children.    Associates degree. Works full time as Financial trader for [employer].   No tobacco, drug use. Occasional ETOH.    Drink caffeine    Wears seatbelt, bike helmet   Bilateral hearing aids   Smoke detector in the home.    Feels safe in relationships.    Past Medical History:  Diagnosis Date   Anxiety    Chicken pox    Depression    Rosanna Randy disease    Hearing impaired    age 29   Heart murmur    since childhood   Kidney stone    Mild mitral valve prolapse 2013   Mild MVP with mild MR by echo in 2013, read as trivial in 2019.   Panic disorder 2015   With agoraphobia     Spermatocele 08/2012   hyrocele bilateral and spermatocele left; PSA normal 2013-2016   Thrombocytopenia (HCC)    mild, chronic   Vitamin D deficiency    Past Surgical History:  Procedure Laterality Date   CHOLECYSTECTOMY  2007   [place]   TRANSTHORACIC ECHOCARDIOGRAM  10/03/2018   Normal LV size and function.  EF 55 to 60%.  No regional wall motion normality.  Trivial late mitral prolapse with trivial regurgitation.  Also trivial AI, TR, PR.   Family History  Problem Relation Age of Onset   Mental illness Mother    Huntington's disease Mother    Depression Mother    Mental illness Father    Hypertension Father    Diabetes Father    Hyperlipidemia Father    Mental illness Sister    Mental illness Brother    Thyroid cancer Maternal Aunt    Mental illness Maternal Aunt    Mental illness Maternal Uncle    Mental illness Paternal Aunt    Mental illness Paternal Uncle    Mental  illness Maternal Grandmother    Pancreatic cancer Maternal Grandfather    Mental illness Maternal Grandfather    Mental illness Paternal Grandmother    Mental illness Paternal Grandfather    Allergies as of 01/14/2019   No Known Allergies     Medication List       Accurate as of January 14, 2019  1:38 PM. Always use your most recent med list.        fluticasone 50 MCG/ACT nasal spray Commonly known as:  FLONASE Place into both nostrils daily.   QUEtiapine 50 MG tablet Commonly known as:  SEROQUEL Take 1 tablet (50 mg total) by mouth at bedtime.   Vitamin D 50 MCG (2000 UT) Caps       All past medical history, surgical history, allergies, family history, immunizations andmedications were updated in the EMR today and reviewed under the history and medication portions of their EMR.     ROS: Negative, with the exception of above mentioned in HPI   Objective:  BP 125/85 (BP Location: Right Arm, Patient Position:  Sitting, Cuff Size: Normal)    Pulse 65    Temp 98.2 F (36.8 C) (Oral)    Resp 16    Ht 6' 2"  (1.88 m)    Wt 186 lb 4 oz (84.5 kg)    SpO2 100%    BMI 23.91 kg/m  Body mass index is 23.91 kg/m. Gen: Afebrile. No acute distress. Nontoxic in appearance, well developed, well nourished.  HENT: AT. [place]. Hearing aids bilateral. MMM, no oral lesions. Bilateral nares w erythema, swelling and drainage. Throat without erythema or exudates. PND present. No cough. No hoarseness.  Eyes:Pupils Equal Round Reactive to light, Extraocular movements intact,  Conjunctiva without redness, discharge or icterus. Neck/lymp/endocrine: Supple,no lymphadenopathy CV: RRR  Chest: CTAB, no wheeze or crackles. Good air movement, normal resp effort.  Skin: no rashes, purpura or petechiae.   No exam data present No results found. No results found for this or any previous visit (from the past 24 hour(s)).  Assessment/Plan: Chris Robertson is a 48 y.o. male present for OV for  Acute non-recurrent maxillary sinusitis Rest, hydrate.  + flonase, mucinex - plain, nasal saline flushes a few times a day.  doxy prescribed, take until completed.  If cough present it can last up to 6-8 weeks.  F/U 2 weeks of not improved.    Reviewed expectations re: course of current medical issues.  Discussed self-management of symptoms.  Outlined signs and symptoms indicating need for more acute intervention.  Patient verbalized understanding and all questions were answered.  Patient received an After-Visit Summary.    No orders of the defined types were placed in this encounter.    Note is dictated utilizing voice recognition software. Although note has been proof read prior to signing, occasional typographical errors still can be missed. If any questions arise, please do not hesitate to call for verification.   electronically signed by:  Howard Pouch, DO  [place]

## 2019-01-30 ENCOUNTER — Encounter: Payer: Self-pay | Admitting: Family Medicine

## 2019-01-30 NOTE — Telephone Encounter (Signed)
Please advise on pts My Chart message about Seroquel taper to wean off

## 2019-01-31 ENCOUNTER — Telehealth: Payer: Self-pay | Admitting: Family Medicine

## 2019-01-31 NOTE — Telephone Encounter (Signed)
Pt was called and detailed message with all instructions were given to patient, okay per DPR. Pt was told to contact office if he wanted to change his appt to a CPE. My Chart message sent with information as well.

## 2019-01-31 NOTE — Telephone Encounter (Signed)
Please inform patient the following information: 1. If desiring to continue to taper Seroquel down or off take half tab QHS for 4 weeks.      - If he finds after cutting to half tab he requires higher dose he can return to to whole tab at anytime.      - if he decides after 4 weeks he wants to continue to taper off he can stop the Seroquel.     - if he stops and decides he would rather stay on  Med he can restart the 1/2 tab at anytime.  2. Sinus: continue nasal saline flushed a few times a day, continue flonase as directed start allegra or zyrtec before bed (take these for about 3-4 weeks at least). Glad he is doing better. Sounds like sinus infection has resolved but inflammatory reaction from pollen count elevation and having getting over a sinus infection. 3. May appt can be changed to CPE (30 min) if desired--> must make sure it is changed on the schedule please-- time slot may be need to be changed if unable to make 30 min etc. Tell him to come fasting if it is a face to face visit by that time- if still virtual will order labs etc to be collected at tent after we have virtual.      ----- Message from Caroll Rancher, LPN sent at 06/05/2157 3:51 PM EDT -----      ----- Message from Liz Beach to Ma Hillock, DO sent at 01/30/2019 3:17 PM -----   This is a follow up question regarding last visit on Sinus infection. I am still having some minor pressure in the nasal cavities around the nose, the cavity area above the eyes has improved some, but still have some pressure there. I continue to take the suggested nasal prescription and is out of the doxycline thingy.    Also would like advice to continue the process to wean off of Seroquel. Currently been on 60m for this past month.    At some point would like to do a complete physical exam once Coronavirus has passed. Next appointment is on May 5th for depression follow-up.    Thank You,

## 2019-02-27 ENCOUNTER — Ambulatory Visit (INDEPENDENT_AMBULATORY_CARE_PROVIDER_SITE_OTHER): Payer: BLUE CROSS/BLUE SHIELD | Admitting: Family Medicine

## 2019-02-27 ENCOUNTER — Encounter: Payer: Self-pay | Admitting: Family Medicine

## 2019-02-27 ENCOUNTER — Other Ambulatory Visit: Payer: Self-pay

## 2019-02-27 VITALS — Ht 74.0 in | Wt 186.0 lb

## 2019-02-27 DIAGNOSIS — J301 Allergic rhinitis due to pollen: Secondary | ICD-10-CM | POA: Diagnosis not present

## 2019-02-27 DIAGNOSIS — J302 Other seasonal allergic rhinitis: Secondary | ICD-10-CM | POA: Diagnosis not present

## 2019-02-27 DIAGNOSIS — F418 Other specified anxiety disorders: Secondary | ICD-10-CM | POA: Diagnosis not present

## 2019-02-27 MED ORDER — LEVOCETIRIZINE DIHYDROCHLORIDE 5 MG PO TABS: 5.0000 mg | ORAL_TABLET | Freq: Every evening | ORAL | 1 refills | Status: DC

## 2019-02-27 NOTE — Progress Notes (Signed)
I have discussed the procedure for the virtual visit with the patient who has given consent to proceed with assessment and treatment.   Jettie Pagan, CMA

## 2019-02-27 NOTE — Progress Notes (Signed)
VIRTUAL VISIT VIA VIDEO  I connected with Chris Robertson on 02/27/19 at 11:00 AM EDT by a video enabled telemedicine application and verified that I am speaking with the correct person using two identifiers. Location patient: Home Location provider: [facility], Office Persons participating in the virtual visit: Patient, Dr. Raoul Pitch and R.Baker, LPN  I discussed the limitations of evaluation and management by telemedicine and the availability of in person appointments. The patient expressed understanding and agreed to proceed.   SUBJECTIVE Chief Complaint  Patient presents with  . Anxiety  . Depression  . Sinus Problem    Still having issues since last visit. Pressure on sides of nose, dry cough, nose bleeds.     HPI:  Seasonal allergies/Seasonal allergic rhinitis due to pollen Patient presents today via virtual visit to discuss his sinus pressure.  He was treated for a sinus infection 3 weeks ago.  He reports he has continued to Flonase nasal spray to treatment with doxycycline.  He states since that time he has intermittent bilateral maxillary sinus pressure that waxes and wanes.  He has developed nosebleeds the past 2 weeks that occur every few days with light bloody nose.  He has developed a dry cough that is daily but very angry frequently 2 times a day randomly.  He reports having a feeling in his chest, he has difficulty describing this feeling and states it feels like phlegm. He denies fever, chills, nausea, vomit, diarrhea, headache, ear pain, sore throat or shortness of breath.  Depression/anxiety: His desire he was given instructions on how to taper off the Seroquel and he has finished tapering off his medications yesterday.  He states he has been feeling well, feels like he is sleeping better.  He is not having any difficulties.  Reports he was let go of his job yesterday secondary to coronavirus outbreak.   ROS: See pertinent positives and negatives per HPI.   Patient Active Problem List   Diagnosis Date Noted  . Near syncope 10/11/2018  . Rapid heart beat 10/11/2018  . DOE (dyspnea on exertion) 10/11/2018  . Hydrocele in adult 05/12/2016  . Anxiety disorder 05/05/2016  . Depression 05/05/2016    Social History   Tobacco Use  . Smoking status: Never Smoker  . Smokeless tobacco: Never Used  Substance Use Topics  . Alcohol use: Yes    Alcohol/week: 2.0 standard drinks    Types: 2 Glasses of wine per week    Current Outpatient Medications:  .  Cholecalciferol (VITAMIN D) 2000 units CAPS, , Disp: , Rfl:  .  fluticasone (FLONASE) 50 MCG/ACT nasal spray, Place 2 sprays into both nostrils daily., Disp: 16 g, Rfl: 2 .  QUEtiapine (SEROQUEL) 50 MG tablet, Take 1 tablet (50 mg total) by mouth at bedtime. (Patient not taking: Reported on 02/27/2019), Disp: 90 tablet, Rfl: 0  No Known Allergies  OBJECTIVE: Ht 6' 2"  (1.88 m)   Wt 186 lb (84.4 kg)   BMI 23.88 kg/m  Gen: No acute distress. Nontoxic in appearance.  HENT: AT. [place].  MMM.  Eyes:Pupils Equal Round Reactive to light, Extraocular movements intact,  Conjunctiva without redness, discharge or icterus. CV: no edema Chest: Cough and shortness of breath not present. Skin: no rashes, purpura or petechiae.  Neuro:  Normal gait. Alert. Oriented x3  Psych: Normal affect, dress and demeanor. Normal speech. Normal thought content and judgment.  ASSESSMENT AND PLAN: Chris Robertson is a 48 y.o. male present for  Seasonal allergies/Seasonal allergic rhinitis due  to pollen - His symptoms seem allergy related. He doe snot appear infectious at this time. Unfortunately flonase is giving him nose bleeds so will DC.  - start nasal saline flushes 1-2 x daily.  - start xyzal QHS- prescribed.  - f/u prn  Depression//anxiety  Stable. Doing well and tapered off seroquel.  Continue healthy lifestyle- routine exercise and self time.  F/u PRN   Howard Pouch, DO 02/27/2019

## 2019-03-04 ENCOUNTER — Ambulatory Visit: Payer: BLUE CROSS/BLUE SHIELD | Admitting: Family Medicine

## 2019-03-19 ENCOUNTER — Encounter: Payer: BLUE CROSS/BLUE SHIELD | Admitting: Family Medicine

## 2019-04-23 ENCOUNTER — Encounter: Payer: Self-pay | Admitting: Family Medicine

## 2019-04-25 ENCOUNTER — Telehealth: Payer: Self-pay | Admitting: *Deleted

## 2019-04-25 ENCOUNTER — Ambulatory Visit (INDEPENDENT_AMBULATORY_CARE_PROVIDER_SITE_OTHER): Payer: BC Managed Care – PPO | Admitting: Family Medicine

## 2019-04-25 ENCOUNTER — Other Ambulatory Visit: Payer: Self-pay

## 2019-04-25 ENCOUNTER — Other Ambulatory Visit: Payer: BC Managed Care – PPO

## 2019-04-25 ENCOUNTER — Encounter: Payer: Self-pay | Admitting: Family Medicine

## 2019-04-25 VITALS — Ht 74.0 in

## 2019-04-25 DIAGNOSIS — J302 Other seasonal allergic rhinitis: Secondary | ICD-10-CM | POA: Diagnosis not present

## 2019-04-25 DIAGNOSIS — Z20822 Contact with and (suspected) exposure to covid-19: Secondary | ICD-10-CM

## 2019-04-25 DIAGNOSIS — J301 Allergic rhinitis due to pollen: Secondary | ICD-10-CM | POA: Diagnosis not present

## 2019-04-25 DIAGNOSIS — R6889 Other general symptoms and signs: Secondary | ICD-10-CM | POA: Diagnosis not present

## 2019-04-25 MED ORDER — BENZONATATE 200 MG PO CAPS: 200.0000 mg | ORAL_CAPSULE | Freq: Two times a day (BID) | ORAL | 0 refills | Status: DC | PRN

## 2019-04-25 MED ORDER — CETIRIZINE HCL 10 MG PO TABS: 10.0000 mg | ORAL_TABLET | Freq: Every day | ORAL | 11 refills | Status: DC

## 2019-04-25 MED ORDER — AMOXICILLIN-POT CLAVULANATE 875-125 MG PO TABS: 1.0000 | ORAL_TABLET | Freq: Two times a day (BID) | ORAL | 0 refills | Status: DC

## 2019-04-25 NOTE — Progress Notes (Signed)
VIRTUAL VISIT VIA VIDEO  I connected with Chris Robertson on 04/25/19 at 11:00 AM EDT by a video enabled telemedicine application and verified that I am speaking with the correct person using two identifiers. Location patient: Home Location provider: [facility], Office Persons participating in the virtual visit: Patient, Dr. Raoul Pitch and R.Baker, LPN  I discussed the limitations of evaluation and management by telemedicine and the availability of in person appointments. The patient expressed understanding and agreed to proceed.   SUBJECTIVE Chief Complaint  Patient presents with  . Cough    Persistant wet cough since Feb., worsened over the past 2 weeks. No post nasal drainage, denies fever.     HPI:  Seasonal allergies/Seasonal allergic rhinitis due to pollen Chris Robertson is a 48 y.o. male present to discuss allergy and sinus symptoms.  Patient has had a persistent cough since February.  He had resolution of his symptoms after starting an antihistamine and having a round of doxycycline.  He was unable to afford the Xyzal, therefore did not start that medication.  Flonase had caused him nosebleeds so he has discontinued using his Flonase.  He states his cough has returned over the last 2 weeks and it has become more pronounced.  It kept him up for 4 evenings in a row.  Over the last couple days it has improved at night but has continued throughout the day.  He states he is no longer draining down the back of his throat that had resolved over the last 2 days.  He is endorsing a slightly productive cough but he reports it feels like he cannot get the cough broke up enough to produce phlegm.  He endorses bilateral maxillary sinus pain.  He endorses fatigue.  He denies fever, chills, shortness of breath.  Of note he reports he has not had allergies in the past.  He did move into a new apartment late December.  Prior note: Patient presents today via virtual visit to discuss his sinus  pressure.  He was treated for a sinus infection 3 weeks ago.  He reports he has continued to Flonase nasal spray to treatment with doxycycline.  He states since that time he has intermittent bilateral maxillary sinus pressure that waxes and wanes.  He has developed nosebleeds the past 2 weeks that occur every few days with light bloody nose.  He has developed a dry cough that is daily but very angry frequently 2 times a day randomly.  He reports having a feeling in his chest, he has difficulty describing this feeling and states it feels like phlegm. He denies fever, chills, nausea, vomit, diarrhea, headache, ear pain, sore throat or shortness of breath.    ROS: See pertinent positives and negatives per HPI.  Patient Active Problem List   Diagnosis Date Noted  . Seasonal allergies 02/27/2019  . Seasonal allergic rhinitis due to pollen 02/27/2019  . Depression with anxiety 02/27/2019  . Congenital hearing loss of both ears 05/23/2016  . Hydrocele in adult 05/12/2016    Social History   Tobacco Use  . Smoking status: Never Smoker  . Smokeless tobacco: Never Used  Substance Use Topics  . Alcohol use: Yes    Alcohol/week: 2.0 standard drinks    Types: 2 Glasses of wine per week    Current Outpatient Medications:  .  acetaminophen (TYLENOL) 325 MG tablet, Take 650 mg by mouth every 6 (six) hours as needed., Disp: , Rfl:  .  Cholecalciferol (VITAMIN D) 2000 units  CAPS, , Disp: , Rfl:  .  levocetirizine (XYZAL) 5 MG tablet, Take 1 tablet (5 mg total) by mouth every evening. (Patient not taking: Reported on 04/25/2019), Disp: 90 tablet, Rfl: 1  No Known Allergies  OBJECTIVE: Ht 6' 2"  (1.88 m)   BMI 23.88 kg/m  Gen: Afebrile. No acute distress.  Appears fatigue. HENT: AT. [place].  MMM.  Tender to palpation bilateral maxillary sinus. Eyes:Pupils Equal Round Reactive to light, Extraocular movements intact,  Conjunctiva without redness, discharge or icterus. Chest: Mild cough on exam, no  shortness of breath present. Skin: No rashes, purpura or petechiae.  Neuro: . Alert. Oriented x3   ASSESSMENT AND PLAN: Chris Robertson is a 48 y.o. male present for  Seasonal allergies/Seasonal allergic rhinitis due to pollen/maxillary sinusitis -I suspect his symptoms are coming from allergies not being controlled causing him to now have a sinus infection.  He did move into anew apartment in late December.  We discussed the possibility of him being allergic to either something blooming in the area near his new apartment or something within the apartment he is having allergies towards.  The apartment does have carpet. -Continue to change air filters every 3 months. - Rest, hydrate.  Start Mucinex (DM if cough), nettie pot or nasal saline.  Augmentin, Tessalon Perles and Zyrtec prescribed, take until completed.  -Patient was encouraged to start Zyrtec and take it nightly for at least a few months. -COVID-19 testing ordered.  Patient educated on COVID-19 and quarantine. If cough present it can last up to 6-8 weeks.  F/U 2 weeks of not improved.   > 15 minutes spent with patient, > 50% of that time face to face   Howard Pouch, DO 04/25/2019

## 2019-04-25 NOTE — Telephone Encounter (Signed)
Pt called and scheduled for testing at [facility] site on 04/25/19. Pt advised to wear a mask and remain in car at scheduled appt time. Understanding verbalized.

## 2019-04-25 NOTE — Telephone Encounter (Signed)
-----   Message from Caroll Rancher, LPN sent at 1/32/4401 11:41 AM EDT ----- Regarding: COVID test Chris Robertson  February 03, 1971  MRN: [id number] Reason: Cough, fatigue  [employer]     [id number]

## 2019-04-30 LAB — NOVEL CORONAVIRUS, NAA: SARS-CoV-2, NAA: NOT DETECTED

## 2019-06-19 ENCOUNTER — Telehealth: Payer: Self-pay

## 2019-06-19 NOTE — Telephone Encounter (Signed)
Pt wrote 4 page letter to Dr Raoul Pitch in reference to several health concerns. Pt states he will be setting up a CPE in the next couple of weeks. Placed on Dr Lucita Lora desk to review.

## 2019-07-18 DIAGNOSIS — F418 Other specified anxiety disorders: Secondary | ICD-10-CM | POA: Diagnosis not present

## 2019-07-18 DIAGNOSIS — Z0000 Encounter for general adult medical examination without abnormal findings: Secondary | ICD-10-CM | POA: Diagnosis not present

## 2019-07-18 DIAGNOSIS — K219 Gastro-esophageal reflux disease without esophagitis: Secondary | ICD-10-CM | POA: Diagnosis not present

## 2019-07-18 DIAGNOSIS — Z23 Encounter for immunization: Secondary | ICD-10-CM | POA: Diagnosis not present

## 2019-08-23 ENCOUNTER — Encounter (HOSPITAL_COMMUNITY): Payer: Self-pay

## 2019-08-23 ENCOUNTER — Other Ambulatory Visit: Payer: Self-pay

## 2019-08-23 ENCOUNTER — Emergency Department (HOSPITAL_COMMUNITY)
Admission: EM | Admit: 2019-08-23 | Discharge: 2019-08-23 | Disposition: A | Discharge status: Home / Self Care | Payer: BC Managed Care – PPO | Attending: Emergency Medicine | Admitting: Emergency Medicine

## 2019-08-23 DIAGNOSIS — Z5321 Procedure and treatment not carried out due to patient leaving prior to being seen by health care provider: Secondary | ICD-10-CM | POA: Insufficient documentation

## 2019-08-23 DIAGNOSIS — R42 Dizziness and giddiness: Secondary | ICD-10-CM | POA: Insufficient documentation

## 2019-08-23 NOTE — ED Triage Notes (Signed)
Per ems: Pt coming from home c/o vertigo that started earlier yesterday. Hx of same. N but no V.    174/86 64 HR 98 room air 131 cbg 97.2 temp  16 rr

## 2019-09-22 ENCOUNTER — Other Ambulatory Visit: Payer: Self-pay

## 2019-09-22 ENCOUNTER — Encounter: Payer: Self-pay | Admitting: Family Medicine

## 2019-09-22 ENCOUNTER — Ambulatory Visit (INDEPENDENT_AMBULATORY_CARE_PROVIDER_SITE_OTHER): Payer: 59 | Admitting: Family Medicine

## 2019-09-22 VITALS — BP 116/75 | HR 82 | Temp 97.6°F | Resp 16 | Ht 74.0 in | Wt 183.4 lb

## 2019-09-22 DIAGNOSIS — L739 Follicular disorder, unspecified: Secondary | ICD-10-CM | POA: Diagnosis not present

## 2019-09-22 MED ORDER — MUPIROCIN CALCIUM 2 % EX CREA: 1.0000 "application " | TOPICAL_CREAM | Freq: Two times a day (BID) | CUTANEOUS | 0 refills | Status: DC

## 2019-09-22 NOTE — Patient Instructions (Addendum)
Put the Bactroban ointment the areas twice a day. This appears to be repeat infection/injury to the hair follicle.    Folliculitis  Folliculitis is inflammation of the hair follicles. Folliculitis most commonly occurs on the scalp, thighs, legs, back, and buttocks. However, it can occur anywhere on the body. What are the causes? This condition may be caused by:  A bacterial infection (common).  A fungal infection.  A viral infection.  Contact with certain chemicals, especially oils and tars.  Shaving or waxing.  Greasy ointments or creams applied to the skin. Long-lasting folliculitis and folliculitis that keeps coming back may be caused by bacteria. This bacteria can live anywhere on your skin and is often found in the nostrils. What increases the risk? You are more likely to develop this condition if you have:  A weakened immune system.  Diabetes.  Obesity. What are the signs or symptoms? Symptoms of this condition include:  Redness.  Soreness.  Swelling.  Itching.  Small white or yellow, pus-filled, itchy spots (pustules) that appear over a reddened area. If there is an infection that goes deep into the follicle, these may develop into a boil (furuncle).  A group of closely packed boils (carbuncle). These tend to form in hairy, sweaty areas of the body. How is this diagnosed? This condition is diagnosed with a skin exam. To find what is causing the condition, your health care provider may take a sample of one of the pustules or boils for testing in a lab. How is this treated? This condition may be treated by:  Applying warm compresses to the affected areas.  Taking an antibiotic medicine or applying an antibiotic medicine to the skin.  Applying or bathing with an antiseptic solution.  Taking an over-the-counter medicine to help with itching.  Having a procedure to drain any pustules or boils. This may be done if a pustule or boil contains a lot of pus or  fluid.  Having laser hair removal. This may be done to treat long-lasting folliculitis. Follow these instructions at home: Managing pain and swelling   If directed, apply heat to the affected area as often as told by your health care provider. Use the heat source that your health care provider recommends, such as a moist heat pack or a heating pad. ? Place a towel between your skin and the heat source. ? Leave the heat on for 20-30 minutes. ? Remove the heat if your skin turns bright red. This is especially important if you are unable to feel pain, heat, or cold. You may have a greater risk of getting burned. General instructions  If you were prescribed an antibiotic medicine, take it or apply it as told by your health care provider. Do not stop using the antibiotic even if your condition improves.  Check the irritated area every day for signs of infection. Check for: ? Redness, swelling, or pain. ? Fluid or blood. ? Warmth. ? Pus or a bad smell.  Do not shave irritated skin.  Take over-the-counter and prescription medicines only as told by your health care provider.  Keep all follow-up visits as told by your health care provider. This is important. Get help right away if:  You have more redness, swelling, or pain in the affected area.  Red streaks are spreading from the affected area.  You have a fever. Summary  Folliculitis is inflammation of the hair follicles. Folliculitis most commonly occurs on the scalp, thighs, legs, back, and buttocks.  This condition may  be treated by taking an antibiotic medicine or applying an antibiotic medicine to the skin, and applying or bathing with an antiseptic solution.  If you were prescribed an antibiotic medicine, take it or apply it as told by your health care provider. Do not stop using the antibiotic even if your condition improves.  Get help right away if you have new or worsening symptoms.  Keep all follow-up visits as told by  your health care provider. This is important. This information is not intended to replace advice given to you by your health care provider. Make sure you discuss any questions you have with your health care provider. Document Released: 12/25/2001 Document Revised: 05/25/2018 Document Reviewed: 05/25/2018 Elsevier Patient Education  2020 [employer].

## 2019-09-22 NOTE — Progress Notes (Signed)
Chris Robertson , April 10, 1971, 48 y.o., male MRN: [id number] Patient Care Team    Relationship Specialty Notifications Start End  Ma Hillock, DO PCP - General Family Medicine  05/05/16     Chief Complaint  Patient presents with  . scars    Pt has scars on the back of left leg and back of neck. Complains of stinging sensation at times      Subjective: Pt presents for an OV with complaints of skin discomfort of his neck and thigh since Early June of and on. He endorses cutting himself shaving his neck and since he has had skin rash that comes and goes.No drainage or fever.    Depression screen [facility] 2/9 02/27/2019 09/03/2018 07/08/2018 03/01/2018 09/03/2017  Decreased Interest 1 2 - 1 1  Down, Depressed, Hopeless 1 1 0 2 3  PHQ - 2 Score 2 3 0 3 4  Altered sleeping 1 2 1 2 2   Tired, decreased energy 0 1 0 2 3  Change in appetite 0 2 0 0 0  Feeling bad or failure about yourself  0 0 - 0 -  Trouble concentrating 0 2 0 0 -  Moving slowly or fidgety/restless 0 2 0 1 1  Suicidal thoughts 0 0 0 0 0  PHQ-9 Score 3 12 1 8 10   Difficult doing work/chores - Somewhat difficult Not difficult at all Somewhat difficult -    No Known Allergies Social History   Social History Narrative   Single. No children.    Associates degree. Works full time as Financial trader for [employer].   No tobacco, drug use. Occasional ETOH.    Drink caffeine    Wears seatbelt, bike helmet   Bilateral hearing aids   Smoke detector in the home.    Feels safe in relationships.    Past Medical History:  Diagnosis Date  . Anxiety   . Chicken pox   . Depression   . DOE (dyspnea on exertion) 10/11/2018  . Gilbert disease   . Hearing impaired    age 47  . Heart murmur    since childhood  . Kidney stone   . Mild mitral valve prolapse 2013   Mild MVP with mild MR by echo in 2013, read as trivial in 2019.  Marland Kitchen Near syncope 10/11/2018  . Panic disorder 2015   With agoraphobia  . Rapid heart beat 10/11/2018  .  Spermatocele 08/2012   hyrocele bilateral and spermatocele left; PSA normal 2013-2016  . Thrombocytopenia (HCC)    mild, chronic  . Vitamin D deficiency    Past Surgical History:  Procedure Laterality Date  . CHOLECYSTECTOMY  2007  . [place]  . SHOULDER SURGERY  1997  . TRANSTHORACIC ECHOCARDIOGRAM  10/03/2018   Normal LV size and function.  EF 55 to 60%.  No regional wall motion normality.  Trivial late mitral prolapse with trivial regurgitation.  Also trivial AI, TR, PR.   Family History  Problem Relation Age of Onset  . Mental illness Mother   . Huntington's disease Mother   . Depression Mother   . Mental illness Father   . Hypertension Father   . Diabetes Father   . Hyperlipidemia Father   . Mental illness Sister   . Mental illness Brother   . Thyroid cancer Maternal Aunt   . Mental illness Maternal Aunt   . Mental illness Maternal Uncle   . Mental illness Paternal Aunt   . Mental illness  Paternal Uncle   . Mental illness Maternal Grandmother   . Pancreatic cancer Maternal Grandfather   . Mental illness Maternal Grandfather   . Mental illness Paternal Grandmother   . Mental illness Paternal Grandfather    Allergies as of 09/22/2019   No Known Allergies     Medication List       Accurate as of September 22, 2019  3:30 PM. If you have any questions, ask your nurse or doctor.        acetaminophen 325 MG tablet Commonly known as: TYLENOL Take 650 mg by mouth every 6 (six) hours as needed.   amoxicillin-clavulanate 875-125 MG tablet Commonly known as: AUGMENTIN Take 1 tablet by mouth 2 (two) times daily.   benzonatate 200 MG capsule Commonly known as: TESSALON Take 1 capsule (200 mg total) by mouth 2 (two) times daily as needed.   cetirizine 10 MG tablet Commonly known as: ZYRTEC Take 1 tablet (10 mg total) by mouth daily.   Vitamin D 50 MCG (2000 UT) Caps       All past medical history, surgical history, allergies, family  history, immunizations andmedications were updated in the EMR today and reviewed under the history and medication portions of their EMR.     ROS: Negative, with the exception of above mentioned in HPI   Objective:  BP 116/75 (BP Location: Right Arm, Patient Position: Sitting, Cuff Size: Normal)   Pulse 82   Temp 97.6 F (36.4 C) (Temporal)   Resp 16   Ht 6' 2"  (1.88 m)   Wt 183 lb 6 oz (83.2 kg)   SpO2 97%   BMI 23.54 kg/m  Body mass index is 23.54 kg/m. Gen: Afebrile. No acute distress. Nontoxic in appearance, well developed, well nourished.  HENT: AT. [place].  Skin: x3 excoriated areas of his neck and X1 posterior/lateral left thigh, mild erythema. No drainage. No purpura or petechiae.  Neuro:  Alert. Oriented x3  Psych: Normal affect, dress and demeanor. Normal speech. Normal thought content and judgment.  No exam data present No results found. No results found for this or any previous visit (from the past 24 hour(s)).  Assessment/Plan: Chris Robertson is a 48 y.o. male present for OV for  Folliculitis excoriated areas of posterior neck x3 and left lateral thigh x2. Possible mild skin infection.  - avoid scratching or picking area.  - use Bactroban BID until resolved.  - avoid shaving over area.  - f/u PRN   Reviewed expectations re: course of current medical issues.  Discussed self-management of symptoms.  Outlined signs and symptoms indicating need for more acute intervention.  Patient verbalized understanding and all questions were answered.  Patient received an After-Visit Summary.   > 15 minutes spent with patient, > 50% of that time face to face This visit occurred during the SARS-CoV-2 public health emergency.  Safety protocols were in place, including screening questions prior to the visit, additional usage of staff PPE, and extensive cleaning of exam room while observing appropriate contact time as indicated for disinfecting solutions.     No orders of the  defined types were placed in this encounter.    Note is dictated utilizing voice recognition software. Although note has been proof read prior to signing, occasional typographical errors still can be missed. If any questions arise, please do not hesitate to call for verification.   electronically signed by:  Howard Pouch, DO  [place]

## 2019-09-23 ENCOUNTER — Telehealth: Payer: Self-pay

## 2019-09-23 MED ORDER — MUPIROCIN 2 % EX OINT: 1.0000 "application " | TOPICAL_OINTMENT | Freq: Two times a day (BID) | CUTANEOUS | 0 refills | Status: DC

## 2019-09-23 NOTE — Telephone Encounter (Signed)
Pt was called and given information, he verbalized understanding

## 2019-09-23 NOTE — Telephone Encounter (Signed)
Fax received from [facility] stating patients insurance plan does not cover the Mupirocin 2% cream but would cover the ointment. Please advise if okay.

## 2019-09-23 NOTE — Telephone Encounter (Signed)
Signed.

## 2019-10-21 ENCOUNTER — Encounter: Payer: Self-pay | Admitting: Family Medicine

## 2019-10-21 DIAGNOSIS — F418 Other specified anxiety disorders: Secondary | ICD-10-CM

## 2019-10-22 NOTE — Telephone Encounter (Signed)
Please advise 

## 2019-10-28 NOTE — Addendum Note (Signed)
Addended by: Howard Pouch A on: 10/28/2019 08:02 AM   Modules accepted: Orders

## 2019-10-28 NOTE — Telephone Encounter (Signed)
There are options within the community which are self referred. 1. Family services at [facility] 2. [facility] 3.  [facility] 4.  [facility] 5.  We have many counselors within our [employer] as well. Which I could refer.   As far as the scheduling whether in person or if they  have after hours is dependent on that office or provider.  I do know with Covid, [place] and [facility] are performing virtual visits- some might be weekends, but this is something I can not answer with certainty.   He can look into the above resources and I can refer to a [place] provider as well, so he has options.  Referral placed.

## 2019-11-20 ENCOUNTER — Ambulatory Visit: Payer: 59 | Admitting: Psychology

## 2020-01-13 ENCOUNTER — Telehealth: Payer: Self-pay | Admitting: Family Medicine

## 2020-01-13 NOTE — Telephone Encounter (Signed)
I have received a correspondence from patient's endodontist.  It seems he is suffering from numbness in which they are concerned for TMJ versus a neuralgia.  From the letter I am not completely clear if he is needing a referral to a specialist.  It seems they were discussing neurology versus oral surgery versus ENT referrals.  If he is needing referral to a specialist, I would have to evaluate him in order to attempt to refer him to an appropriate specialist.  If this is what he is desiring, please encourage him to schedule an appointment for evaluation-in person.

## 2020-01-21 ENCOUNTER — Other Ambulatory Visit: Payer: Self-pay

## 2020-01-21 ENCOUNTER — Encounter: Payer: Self-pay | Admitting: Family Medicine

## 2020-01-21 ENCOUNTER — Ambulatory Visit: Payer: 59 | Admitting: Family Medicine

## 2020-01-21 VITALS — BP 137/90 | HR 99 | Temp 97.8°F | Resp 16 | Ht 74.0 in | Wt 187.5 lb

## 2020-01-21 DIAGNOSIS — R42 Dizziness and giddiness: Secondary | ICD-10-CM | POA: Diagnosis not present

## 2020-01-21 DIAGNOSIS — R430 Anosmia: Secondary | ICD-10-CM

## 2020-01-21 MED ORDER — LEVOCETIRIZINE DIHYDROCHLORIDE 5 MG PO TABS: 5.0000 mg | ORAL_TABLET | Freq: Every evening | ORAL | 5 refills | Status: DC

## 2020-01-21 MED ORDER — FLUTICASONE PROPIONATE 50 MCG/ACT NA SUSP: 2.0000 | Freq: Every day | NASAL | 0 refills | Status: DC

## 2020-01-21 NOTE — Progress Notes (Signed)
This visit occurred during the SARS-CoV-2 public health emergency.  Safety protocols were in place, including screening questions prior to the visit, additional usage of staff PPE, and extensive cleaning of exam room while observing appropriate contact time as indicated for disinfecting solutions.    Chris Robertson , [birth year], 49 y.o., male MRN: [id number] Patient Care Team    Relationship Specialty Notifications Start End  Ma Hillock, DO PCP - General Family Medicine  05/05/16     Chief Complaint  Patient presents with  . Jaw Pain    Pt is having jaw pain and vertigo. Pt often feels unbalanced.      Subjective: Pt presents for an OV with complaints of vertigo and anosmia.  Vertigo: Patient reports onset of vertigo, this occurrence, after dental work spring 2019.  He reports he had mercury fillings in his molars that had dissolved.  He had dental work completed to replace the fillings in which he reports was "aggressive.  "Had headaches for a few weeks after his dental work and spinning has started at that time when laying flat.  He reports he had to sleep upright for quite some time until recent root canal.  In a similar presentation in 2014 with vertigo and dental work, that resolved after a root canal.  He reports the pain he has experienced is at the location of the root canal and has been intermittent.  He does endorse occasional fullness or discomfort down the anterior lateral sides of his neck and behind his ear during "flareups ".  He reports his flareups can be very random lasting for a few minutes to hours.  He states since his most recent root canal he has seen an improvement in his vertigo symptoms but it has not completely resolved.  Anosmia: Patient reports he has had no sense of smell since around high school.  He does have chronic allergic rhinitis which has been treated with Flonase and antihistamines in the past without resolution of his sense of smell.  He has an ENT,  she has seen in the past for his congenital hearing loss of both ears.  Depression screen [facility] 2/9 02/27/2019 09/03/2018 07/08/2018 03/01/2018 09/03/2017  Decreased Interest 1 2 - 1 1  Down, Depressed, Hopeless 1 1 0 2 3  PHQ - 2 Score 2 3 0 3 4  Altered sleeping 1 2 1 2 2   Tired, decreased energy 0 1 0 2 3  Change in appetite 0 2 0 0 0  Feeling bad or failure about yourself  0 0 - 0 -  Trouble concentrating 0 2 0 0 -  Moving slowly or fidgety/restless 0 2 0 1 1  Suicidal thoughts 0 0 0 0 0  PHQ-9 Score 3 12 1 8 10   Difficult doing work/chores - Somewhat difficult Not difficult at all Somewhat difficult -    No Known Allergies Social History   Social History Narrative   Single. No children.    Associates degree. Works full time as Financial trader for [employer].   No tobacco, drug use. Occasional ETOH.    Drink caffeine    Wears seatbelt, bike helmet   Bilateral hearing aids   Smoke detector in the home.    Feels safe in relationships.    Past Medical History:  Diagnosis Date  . Anxiety   . Chicken pox   . Depression   . DOE (dyspnea on exertion) 10/11/2018  . Gilbert disease   . Hearing impaired  age 35  . Heart murmur    since childhood  . Kidney stone   . Mild mitral valve prolapse 2013   Mild MVP with mild MR by echo in 2013, read as trivial in 2019.  Marland Kitchen Near syncope 10/11/2018  . Panic disorder 2015   With agoraphobia  . Rapid heart beat 10/11/2018  . Spermatocele 08/2012   hyrocele bilateral and spermatocele left; PSA normal 2013-2016  . Thrombocytopenia (HCC)    mild, chronic  . Vitamin D deficiency    Past Surgical History:  Procedure Laterality Date  . CHOLECYSTECTOMY  2007  . [place]  . SHOULDER SURGERY  1997  . TRANSTHORACIC ECHOCARDIOGRAM  10/03/2018   Normal LV size and function.  EF 55 to 60%.  No regional wall motion normality.  Trivial late mitral prolapse with trivial regurgitation.  Also trivial AI, TR, PR.   Family History   Problem Relation Age of Onset  . Mental illness Mother   . Huntington's disease Mother   . Depression Mother   . Mental illness Father   . Hypertension Father   . Diabetes Father   . Hyperlipidemia Father   . Mental illness Sister   . Mental illness Brother   . Thyroid cancer Maternal Aunt   . Mental illness Maternal Aunt   . Mental illness Maternal Uncle   . Mental illness Paternal Aunt   . Mental illness Paternal Uncle   . Mental illness Maternal Grandmother   . Pancreatic cancer Maternal Grandfather   . Mental illness Maternal Grandfather   . Mental illness Paternal Grandmother   . Mental illness Paternal Grandfather    Allergies as of 01/21/2020   No Known Allergies     Medication List       Accurate as of January 21, 2020  4:58 PM. If you have any questions, ask your nurse or doctor.        STOP taking these medications   mupirocin cream 2 % Commonly known as: Bactroban Stopped by: Howard Pouch, DO   mupirocin ointment 2 % Commonly known as: Bactroban Stopped by: Howard Pouch, DO     TAKE these medications   fluticasone 50 MCG/ACT nasal spray Commonly known as: FLONASE Place 2 sprays into both nostrils daily. Started by: Howard Pouch, DO   levocetirizine 5 MG tablet Commonly known as: Xyzal Take 1 tablet (5 mg total) by mouth every evening. Started by: Howard Pouch, DO   Vitamin D 19 MCG (2000 UT) Caps       All past medical history, surgical history, allergies, family history, immunizations andmedications were updated in the EMR today and reviewed under the history and medication portions of their EMR.     ROS: Negative, with the exception of above mentioned in HPI   Objective:  BP 137/90 (BP Location: Left Arm, Patient Position: Sitting, Cuff Size: Normal)   Pulse 99   Temp 97.8 F (36.6 C) (Temporal)   Resp 16   Ht 6' 2"  (1.88 m)   Wt 187 lb 8 oz (85 kg)   SpO2 98%   BMI 24.07 kg/m  Body mass index is 24.07 kg/m. Gen: Afebrile. No  acute distress. Nontoxic in appearance, well developed, well nourished.  HENT: AT. [place].  Eyes:Pupils Equal Round Reactive to light, Extraocular movements intact,  Conjunctiva without redness, discharge or icterus. Neck/lymp/endocrine: Supple, no lymphadenopathy MSK: No TMJ tenderness to palpation, no swelling.  Full range of motion in extension of jaw without pain,  clicking or popping. Neuro: Normal gait. PERLA. EOMi. Alert. Oriented x3  No exam data present No results found. No results found for this or any previous visit (from the past 24 hour(s)).  Assessment/Plan: Chris Robertson is a 49 y.o. male present for OV for  Vertigo Discussed options with him today he has seen some improvement in his vertigo symptoms since the root canal.  Although still having occasional symptoms when laying flat.  Therefore he is agreeable to trying vestibular rehab.  Referral placed for this today. - PT vestibular rehab; Future - Ambulatory referral to Physical Therapy  Anosmia Anosmia has been present since high school.  Discussed referral to ENT and he would like to start with ENT evaluation.  Encouraged him to make sure he is on a routine/daily allergy regimen as well. Flonase as tolerated every other day-if nosebleeds are experienced he will needed to discontinue. Start Xyzal nightly-prescribed today Discussed anosmia can be allergic related versus disease process related.  He does have congenital hearing loss ? potentially related. - Ambulatory referral to ENT   Reviewed expectations re: course of current medical issues.  Discussed self-management of symptoms.  Outlined signs and symptoms indicating need for more acute intervention.  Patient verbalized understanding and all questions were answered.  Patient received an After-Visit Summary.    Orders Placed This Encounter  Procedures  . Ambulatory referral to ENT  . Ambulatory referral to Physical Therapy  . PT vestibular rehab   Meds  ordered this encounter  Medications  . fluticasone (FLONASE) 50 MCG/ACT nasal spray    Sig: Place 2 sprays into both nostrils daily.    Dispense:  16 g    Refill:  0  . levocetirizine (XYZAL) 5 MG tablet    Sig: Take 1 tablet (5 mg total) by mouth every evening.    Dispense:  30 tablet    Refill:  5    Referral Orders     Ambulatory referral to ENT     Ambulatory referral to Physical Therapy   Note is dictated utilizing voice recognition software. Although note has been proof read prior to signing, occasional typographical errors still can be missed. If any questions arise, please do not hesitate to call for verification.   electronically signed by:  Howard Pouch, DO  [place]

## 2020-01-21 NOTE — Patient Instructions (Signed)
I will refer you to vestibular rehab and ENT. They will call you to schedule.    Vertigo Vertigo is the feeling that you or the things around you are moving when they are not. This feeling can come and go at any time. Vertigo often goes away on its own. This condition can be dangerous if it happens when you are doing activities like driving or working with machines. Your doctor will do tests to find the cause of your vertigo. These tests will also help your doctor decide on the best treatment for you. Follow these instructions at home: Eating and drinking      Drink enough fluid to keep your pee (urine) pale yellow.  Do not drink alcohol. Activity  Return to your normal activities as told by your doctor. Ask your doctor what activities are safe for you.  In the morning, first sit up on the side of the bed. When you feel okay, stand slowly while you hold onto something until you know that your balance is fine.  Move slowly. Avoid sudden body or head movements or certain positions, as told by your doctor.  Use a cane if you have trouble standing or walking.  Sit down right away if you feel dizzy.  Avoid doing any tasks or activities that can cause danger to you or others if you get dizzy.  Avoid bending down if you feel dizzy. Place items in your home so that they are easy for you to reach without leaning over.  Do not drive or use heavy machinery if you feel dizzy. General instructions  Take over-the-counter and prescription medicines only as told by your doctor.  Keep all follow-up visits as told by your doctor. This is important. Contact a doctor if:  Your medicine does not help your vertigo.  You have a fever.  Your problems get worse or you have new symptoms.  Your family or friends see changes in your behavior.  The feeling of being sick to your stomach gets worse.  Your vomiting gets worse.  You lose feeling (have numbness) in part of your body.  You feel  prickling and tingling in a part of your body. Get help right away if:  You have trouble moving or talking.  You are always dizzy.  You pass out (faint).  You get very bad headaches.  You feel weak in your hands, arms, or legs.  You have changes in your hearing.  You have changes in how you see (vision).  You get a stiff neck.  Bright light starts to bother you. Summary  Vertigo is the feeling that you or the things around you are moving when they are not.  Your doctor will do tests to find the cause of your vertigo.  You may be told to avoid some tasks, positions, or movements.  Contact a doctor if your medicine is not helping, or if you have a fever, new symptoms, or a change in behavior.  Get help right away if you get very bad headaches, or if you have changes in how you speak, hear, or see. This information is not intended to replace advice given to you by your health care provider. Make sure you discuss any questions you have with your health care provider. Document Revised: 09/09/2018 Document Reviewed: 09/09/2018 Elsevier Patient Education  2020 [employer].

## 2020-01-23 ENCOUNTER — Encounter: Payer: Self-pay | Admitting: Family Medicine

## 2020-01-23 NOTE — Telephone Encounter (Signed)
Please inform referral coordinator to cancel vestibular/POT rehab. Referral.   If he gets called to schedule it> he can tell them he does not desire to start yet.

## 2020-01-23 NOTE — Telephone Encounter (Signed)
Sent to Dr Raoul Pitch

## 2020-02-09 ENCOUNTER — Encounter: Payer: Self-pay | Admitting: Family Medicine

## 2020-02-17 ENCOUNTER — Ambulatory Visit (INDEPENDENT_AMBULATORY_CARE_PROVIDER_SITE_OTHER): Payer: 59 | Admitting: Otolaryngology

## 2020-03-08 ENCOUNTER — Other Ambulatory Visit: Payer: Self-pay

## 2020-03-08 ENCOUNTER — Ambulatory Visit: Payer: 59 | Admitting: Family Medicine

## 2020-03-08 ENCOUNTER — Encounter: Payer: Self-pay | Admitting: Family Medicine

## 2020-03-08 VITALS — BP 129/85 | HR 95 | Temp 98.2°F | Resp 16 | Ht 74.0 in | Wt 188.1 lb

## 2020-03-08 DIAGNOSIS — R1012 Left upper quadrant pain: Secondary | ICD-10-CM | POA: Diagnosis not present

## 2020-03-08 DIAGNOSIS — R21 Rash and other nonspecific skin eruption: Secondary | ICD-10-CM

## 2020-03-08 MED ORDER — CLOTRIMAZOLE 1 % EX CREA: 1.0000 "application " | TOPICAL_CREAM | Freq: Two times a day (BID) | CUTANEOUS | 0 refills | Status: DC

## 2020-03-08 NOTE — Patient Instructions (Addendum)
Pancreatitis Eating Plan Pancreatitis is when your pancreas becomes irritated and swollen (inflamed). The pancreas is a small organ located behind your stomach. It helps your body digest food and regulate your blood sugar. Pancreatitis can affect how your body digests food, especially foods with fat. You may also have other symptoms such as abdominal pain or nausea. When you have pancreatitis, following a low-fat eating plan may help you manage symptoms and recover more quickly. Work with your health care provider or a diet and nutrition specialist (dietitian) to create an eating plan that is right for you. What are tips for following this plan? Reading food labels Use the information on food labels to help keep track of how much fat you eat:  Check the serving size.  Look for the amount of total fat in grams (g) in one serving. ? Low-fat foods have 3 g of fat or less per serving. ? Fat-free foods have 0.5 g of fat or less per serving.  Keep track of how much fat you eat based on how many servings you eat. ? For example, if you eat two servings, the amount of fat you eat will be two times what is listed on the label. Shopping   Buy low-fat or nonfat foods, such as: ? Fresh, frozen, or canned fruits and vegetables. ? Grains, including pasta, bread, and rice. ? Lean meat, poultry, fish, and other protein foods. ? Low-fat or nonfat dairy.  Avoid buying bakery products and other sweets made with whole milk, butter, and eggs.  Avoid buying snack foods with added fat, such as anything with butter or cheese flavoring. Cooking  Remove skin from poultry, and remove extra fat from meat.  Limit the amount of fat and oil you use to 6 teaspoons or less per day.  Cook using low-fat methods, such as boiling, broiling, grilling, steaming, or baking.  Use spray oil to cook. Add fat-free chicken broth to add flavor and moisture.  Avoid adding cream to thicken soups or sauces. Use other  thickeners such as corn starch or tomato paste. Meal planning   Eat a low-fat diet as told by your dietitian. For most people, this means having no more than 55-65 grams of fat each day.  Eat small, frequent meals throughout the day. For example, you may have 5-6 small meals instead of 3 large meals.  Drink enough fluid to keep your urine pale yellow.  Do not drink alcohol. Talk to your health care provider if you need help stopping.  Limit how much caffeine you have, including black coffee, black and green tea, caffeinated soft drinks, and energy drinks. General information  Let your health care provider or dietitian know if you have unplanned weight loss on this eating plan.  You may be instructed to follow a clear liquid diet during a flare of symptoms. Talk with your health care provider about how to manage your diet during symptoms of a flare.  Take any vitamins or supplements as told by your health care provider.  Work with a Microbiologist, especially if you have other conditions such as obesity or diabetes mellitus. What foods should I avoid? Fruits Fried fruits. Fruits served with butter or cream. Vegetables Fried vegetables. Vegetables cooked with butter, cheese, or cream. Grains Biscuits, waffles, donuts, pastries, and croissants. Pies and cookies. Butter-flavored popcorn. Regular crackers. Meats and other protein foods Fatty cuts of meat. Poultry with skin. Organ meats. Bacon, sausage, and cold cuts. Whole eggs. Nuts and nut butters. Dairy Whole and  2% milk. Whole milk yogurt. Whole milk ice cream. Cream and half-and-half. Cream cheese. Sour cream. Cheese. Beverages Wine, beer, and liquor. The items listed above may not be a complete list of foods and beverages to avoid. Contact a dietitian for more information. Summary  Pancreatitis can affect how your body digests food, especially foods with fat.  When you have pancreatitis, it is recommended that you follow a low-fat  eating plan to help you recover more quickly and manage symptoms. For most people, this means limiting fat to no more than 55-65 grams per day.  Do not drink alcohol. Limit the amount of caffeine you have, and drink enough fluid to keep your urine pale yellow. This information is not intended to replace advice given to you by your health care provider. Make sure you discuss any questions you have with your health care provider. Document Revised: 02/06/2019 Document Reviewed: 01/22/2018 Elsevier Patient Education  [place].

## 2020-03-08 NOTE — Progress Notes (Signed)
This visit occurred during the SARS-CoV-2 public health emergency.  Safety protocols were in place, including screening questions prior to the visit, additional usage of staff PPE, and extensive cleaning of exam room while observing appropriate contact time as indicated for disinfecting solutions.    Chris Robertson , 11/24/1970, 49 y.o., male MRN: [id number] Patient Care Team    Relationship Specialty Notifications Start End  Ma Hillock, DO PCP - General Family Medicine  05/05/16     Chief Complaint  Patient presents with  . Rash    Small dime size rash on patients left shoulder x2 weeks. Used over the counter Cortisone and anti fungal cream with no relief   . Abdominal Pain    L sided abd pain that is off and on x2 weeks      Subjective: Pt presents for an OV with complaints of left upper quadrant abdominal pain that started approximately 2-3 weeks ago and has been worsening over the last week.  He reports the sharp pain that lasts a few seconds in his left upper quadrant, that now has progressed to daily sharp pain-multiple times a day.  He denies fever, chills, nausea, vomit or bowel habit changes.  He states both his brother and father have pancreatitis flares.  Patient does not consume alcohol.  He has had a cholecystectomy many years ago.  His cholesterol/triglycerides in 2017 were normal. He endorses a new rash that occurred approximately 2 weeks ago on his left deltoid.  It is around red and scaly rash.  He reports he was putting a eczema/cortisone cream over there this area over the last 2 weeks and it has not improved, but has not worsened.  He has no history of eczema or psoriasis.  He has received his Covid injections, however they were in the opposite arm of rash.  Depression screen [facility] 2/9 02/27/2019 09/03/2018 07/08/2018 03/01/2018 09/03/2017  Decreased Interest 1 2 - 1 1  Down, Depressed, Hopeless 1 1 0 2 3  PHQ - 2 Score 2 3 0 3 4  Altered sleeping _0 Tired,  decreased energy 0 1 0 2 3  Change in appetite 0 2 0 0 0  Feeling bad or failure about yourself  0 0 - 0 -  Trouble concentrating 0 2 0 0 -  Moving slowly or fidgety/restless 0 2 0 1 1  Suicidal thoughts 0 0 0 0 0  PHQ-9 Score _1 Difficult doing work/chores - Somewhat difficult Not difficult at all Somewhat difficult -    No Known Allergies Social History   Social History Narrative   Single. No children.    Associates degree. Works full time as Financial trader for [employer].   No tobacco, drug use. Occasional ETOH.    Drink caffeine    Wears seatbelt, bike helmet   Bilateral hearing aids   Smoke detector in the home.    Feels safe in relationships.    Past Medical History:  Diagnosis Date  . Anxiety   . Chicken pox   . Depression   . DOE (dyspnea on exertion) 10/11/2018  . Gilbert disease   . Hearing impaired    age 69  . Heart murmur    since childhood  . Kidney stone   . Mild mitral valve prolapse 2013   Mild MVP with mild MR by echo in 2013, read as trivial in 2019.  Marland Kitchen Near syncope 10/11/2018  . Panic  disorder 2015   With agoraphobia  . Rapid heart beat 10/11/2018  . Spermatocele 08/2012   hyrocele bilateral and spermatocele left; PSA normal 2013-2016  . Thrombocytopenia (HCC)    mild, chronic  . Vitamin D deficiency    Past Surgical History:  Procedure Laterality Date  . CHOLECYSTECTOMY  2007  . [place]  . SHOULDER SURGERY  1997  . TRANSTHORACIC ECHOCARDIOGRAM  10/03/2018   Normal LV size and function.  EF 55 to 60%.  No regional wall motion normality.  Trivial late mitral prolapse with trivial regurgitation.  Also trivial AI, TR, PR.   Family History  Problem Relation Age of Onset  . Mental illness Mother   . Huntington's disease Mother   . Depression Mother   . Mental illness Father   . Hypertension Father   . Diabetes Father   . Hyperlipidemia Father   . Mental illness Sister   . Mental illness Brother   . Thyroid  cancer Maternal Aunt   . Mental illness Maternal Aunt   . Mental illness Maternal Uncle   . Mental illness Paternal Aunt   . Mental illness Paternal Uncle   . Mental illness Maternal Grandmother   . Pancreatic cancer Maternal Grandfather   . Mental illness Maternal Grandfather   . Mental illness Paternal Grandmother   . Mental illness Paternal Grandfather    Allergies as of 03/08/2020   No Known Allergies     Medication List       Accurate as of Mar 08, 2020 11:59 PM. If you have any questions, ask your nurse or doctor.        STOP taking these medications   fluticasone 50 MCG/ACT nasal spray Commonly known as: FLONASE Stopped by: Howard Pouch, DO     TAKE these medications   clotrimazole 1 % cream Commonly known as: LOTRIMIN Apply 1 application topically 2 (two) times daily. Started by: Howard Pouch, DO   levocetirizine 5 MG tablet Commonly known as: Xyzal Take 1 tablet (5 mg total) by mouth every evening.   Vitamin D 50 MCG (2000 UT) Caps       All past medical history, surgical history, allergies, family history, immunizations andmedications were updated in the EMR today and reviewed under the history and medication portions of their EMR.     ROS: Negative, with the exception of above mentioned in HPI   Objective:  BP 129/85 (BP Location: Right Arm, Patient Position: Sitting, Cuff Size: Normal)   Pulse 95   Temp 98.2 F (36.8 C) (Temporal)   Resp 16   Ht 6' 2"  (1.88 m)   Wt 188 lb 2 oz (85.3 kg)   SpO2 97%   BMI 24.15 kg/m  Body mass index is 24.15 kg/m. Gen: Afebrile. No acute distress. Nontoxic in appearance, well developed, well nourished.  HENT: AT. [place].  Eyes:Pupils Equal Round Reactive to light, Extraocular movements intact,  Conjunctiva without redness, discharge or icterus. Neck/lymp/endocrine: Supple,no lymphadenopathy CV: RRR  Chest: CTAB, no wheeze or crackles. Good air movement, normal resp effort.  Abd: Soft.  Flat.  Mild-moderate  tenderness left upper quadrant present on exam.  ND. BS present..  No masses palpated. No rebound or guarding.  No hepatosplenomegaly. Skin: Approximately 1 cm round red dry scaly rash of left deltoid, no purpura or petechiae.  Neuro:  Normal gait. PERLA. EOMi. Alert. Oriented x3  Psych: Normal affect, dress and demeanor. Normal speech. Normal thought content and judgment.  No exam  data present No results found. Results for orders placed or performed in visit on 03/08/20 (from the past 24 hour(s))  Lipase     Status: None   Collection Time: 03/08/20  3:27 PM  Result Value Ref Range   Lipase 28 7 - 60 U/L  Amylase     Status: None   Collection Time: 03/08/20  3:27 PM  Result Value Ref Range   Amylase 41 21 - 101 U/L  Comp Met (CMET)     Status: Abnormal   Collection Time: 03/08/20  3:27 PM  Result Value Ref Range   Glucose, Bld 125 (H) 65 - 99 mg/dL   BUN 22 7 - 25 mg/dL   Creat 0.99 0.60 - 1.35 mg/dL   BUN/Creatinine Ratio NOT APPLICABLE 6 - 22 (calc)   Sodium 140 135 - 146 mmol/L   Potassium 4.1 3.5 - 5.3 mmol/L   Chloride 106 98 - 110 mmol/L   CO2 26 20 - 32 mmol/L   Calcium 9.7 8.6 - 10.3 mg/dL   Total Protein 6.7 6.1 - 8.1 g/dL   Albumin 4.3 3.6 - 5.1 g/dL   Globulin 2.4 1.9 - 3.7 g/dL (calc)   AG Ratio 1.8 1.0 - 2.5 (calc)   Total Bilirubin 1.4 (H) 0.2 - 1.2 mg/dL   Alkaline phosphatase (APISO) 95 36 - 130 U/L   AST 22 10 - 40 U/L   ALT 30 9 - 46 U/L  CBC     Status: None   Collection Time: 03/08/20  3:27 PM  Result Value Ref Range   WBC 7.5 3.8 - 10.8 Thousand/uL   RBC 5.23 4.20 - 5.80 Million/uL   Hemoglobin 16.9 13.2 - 17.1 g/dL   HCT 47.8 38.5 - 50.0 %   MCV 91.4 80.0 - 100.0 fL   MCH 32.3 27.0 - 33.0 pg   MCHC 35.4 32.0 - 36.0 g/dL   RDW 12.5 11.0 - 15.0 %   Platelets 178 140 - 400 Thousand/uL   MPV 12.2 7.5 - 12.5 fL    Assessment/Plan: Chris Robertson is a 49 y.o. male present for OV for  Left upper quadrant abdominal pain Patient with worsening left  upper quadrant abdominal pain greater than 2 weeks, tender on exam today, with a family history of pancreatitis in both his brother and father.  Laboratory work-up today.  CT abdomen pelvis needed for further evaluation of left upper quadrant, concern for pancreatitis versus splenic infarct. - Lipase - Amylase - Comp Met (CMET) - CBC  Rash Not responsive to over-the-counter steroid cream.  Possible fungal/tinea infection versus guttate psoriasis. Trial of Chlortrimazole prescribed.  Patient aware to use medication twice daily.  If rash is fungal in nature should at least see some improvement in 2 weeks.  He understands rash might not be completely resolved at that time that he should see improvement.  He was encouraged to continue cream at least 1 week past complete resolution. If not improving, or worsening and 2 to 4 weeks follow-up for further evaluation   Reviewed expectations re: course of current medical issues.  Discussed self-management of symptoms.  Outlined signs and symptoms indicating need for more acute intervention.  Patient verbalized understanding and all questions were answered.  Patient received an After-Visit Summary.    Orders Placed This Encounter  Procedures  . CT Abdomen Pelvis W Contrast  . Lipase  . Amylase  . Comp Met (CMET)  . CBC   Meds ordered this encounter  Medications  .  clotrimazole (LOTRIMIN) 1 % cream    Sig: Apply 1 application topically 2 (two) times daily.    Dispense:  30 g    Refill:  0   Referral Orders  No referral(s) requested today     Note is dictated utilizing voice recognition software. Although note has been proof read prior to signing, occasional typographical errors still can be missed. If any questions arise, please do not hesitate to call for verification.   electronically signed by:  Howard Pouch, DO  [place]

## 2020-03-09 ENCOUNTER — Telehealth: Payer: Self-pay | Admitting: Family Medicine

## 2020-03-09 ENCOUNTER — Encounter: Payer: Self-pay | Admitting: Family Medicine

## 2020-03-09 DIAGNOSIS — R1012 Left upper quadrant pain: Secondary | ICD-10-CM | POA: Insufficient documentation

## 2020-03-09 DIAGNOSIS — R21 Rash and other nonspecific skin eruption: Secondary | ICD-10-CM | POA: Insufficient documentation

## 2020-03-09 LAB — COMPREHENSIVE METABOLIC PANEL
AG Ratio: 1.8 (calc) (ref 1.0–2.5)
ALT: 30 U/L (ref 9–46)
AST: 22 U/L (ref 10–40)
Albumin: 4.3 g/dL (ref 3.6–5.1)
Alkaline phosphatase (APISO): 95 U/L (ref 36–130)
BUN: 22 mg/dL (ref 7–25)
CO2: 26 mmol/L (ref 20–32)
Calcium: 9.7 mg/dL (ref 8.6–10.3)
Chloride: 106 mmol/L (ref 98–110)
Creat: 0.99 mg/dL (ref 0.60–1.35)
Globulin: 2.4 g/dL (calc) (ref 1.9–3.7)
Glucose, Bld: 125 mg/dL — ABNORMAL HIGH (ref 65–99)
Potassium: 4.1 mmol/L (ref 3.5–5.3)
Sodium: 140 mmol/L (ref 135–146)
Total Bilirubin: 1.4 mg/dL — ABNORMAL HIGH (ref 0.2–1.2)
Total Protein: 6.7 g/dL (ref 6.1–8.1)

## 2020-03-09 LAB — CBC
HCT: 47.8 % (ref 38.5–50.0)
Hemoglobin: 16.9 g/dL (ref 13.2–17.1)
MCH: 32.3 pg (ref 27.0–33.0)
MCHC: 35.4 g/dL (ref 32.0–36.0)
MCV: 91.4 fL (ref 80.0–100.0)
MPV: 12.2 fL (ref 7.5–12.5)
Platelets: 178 10*3/uL (ref 140–400)
RBC: 5.23 10*6/uL (ref 4.20–5.80)
RDW: 12.5 % (ref 11.0–15.0)
WBC: 7.5 10*3/uL (ref 3.8–10.8)

## 2020-03-09 LAB — LIPASE: Lipase: 28 U/L (ref 7–60)

## 2020-03-09 LAB — AMYLASE: Amylase: 41 U/L (ref 21–101)

## 2020-03-09 NOTE — Telephone Encounter (Signed)
Please inform patient the following information: Lipase and amylase, which are his pancreatic enzymes are normal. Liver and kidney are also normal. White cell counts are normal without elevation in white blood cell count.  In order to further evaluate his pain, we will need to order a CT of his abdomen.  I have placed this order today and they will call him to get him scheduled at the [facility] imaging location.

## 2020-03-09 NOTE — Telephone Encounter (Signed)
Pt was called and given lab results/instructions, he verbalized understanding

## 2020-03-16 ENCOUNTER — Other Ambulatory Visit: Payer: 59

## 2020-03-25 ENCOUNTER — Encounter: Payer: Self-pay | Admitting: Family Medicine

## 2020-03-30 MED ORDER — FLUOCINOLONE ACETONIDE 0.01 % EX CREA: TOPICAL_CREAM | Freq: Two times a day (BID) | CUTANEOUS | 0 refills | Status: DC

## 2020-03-30 NOTE — Addendum Note (Signed)
Addended by: Howard Pouch A on: 03/30/2020 04:45 PM   Modules accepted: Orders

## 2020-03-30 NOTE — Telephone Encounter (Signed)
Use antifungal cream for a full 4 weeks- if absolutely no improvement then it is consider a treatment failure.   If that is the case, he is to Dc the antifungal cream and start the  higher potency Steroid cream  BID that I called in today. Only need to pick up steroid cream if DCing the antifungal cream.   If still no improvement after additional 2 weeks, then we will need to refer to derm.

## 2020-03-31 NOTE — Telephone Encounter (Signed)
Not likely from covid vaccine.

## 2020-03-31 NOTE — Telephone Encounter (Signed)
Pt was sent my chart message with instructions

## 2020-05-18 ENCOUNTER — Other Ambulatory Visit: Payer: Self-pay

## 2020-05-18 ENCOUNTER — Encounter: Payer: Self-pay | Admitting: Family Medicine

## 2020-05-18 ENCOUNTER — Ambulatory Visit (INDEPENDENT_AMBULATORY_CARE_PROVIDER_SITE_OTHER): Payer: 59 | Admitting: Family Medicine

## 2020-05-18 VITALS — BP 135/91 | HR 94 | Temp 98.6°F | Ht 74.0 in | Wt 194.0 lb

## 2020-05-18 DIAGNOSIS — Z1211 Encounter for screening for malignant neoplasm of colon: Secondary | ICD-10-CM | POA: Diagnosis not present

## 2020-05-18 DIAGNOSIS — R7989 Other specified abnormal findings of blood chemistry: Secondary | ICD-10-CM | POA: Insufficient documentation

## 2020-05-18 DIAGNOSIS — Z131 Encounter for screening for diabetes mellitus: Secondary | ICD-10-CM | POA: Diagnosis not present

## 2020-05-18 DIAGNOSIS — Z130 Encounter for screening for diseases of the blood and blood-forming organs and certain disorders involving the immune mechanism: Secondary | ICD-10-CM | POA: Diagnosis not present

## 2020-05-18 DIAGNOSIS — Z114 Encounter for screening for human immunodeficiency virus [HIV]: Secondary | ICD-10-CM | POA: Diagnosis not present

## 2020-05-18 DIAGNOSIS — F418 Other specified anxiety disorders: Secondary | ICD-10-CM

## 2020-05-18 DIAGNOSIS — Z13220 Encounter for screening for lipoid disorders: Secondary | ICD-10-CM | POA: Diagnosis not present

## 2020-05-18 DIAGNOSIS — Z8349 Family history of other endocrine, nutritional and metabolic diseases: Secondary | ICD-10-CM | POA: Insufficient documentation

## 2020-05-18 DIAGNOSIS — Z0000 Encounter for general adult medical examination without abnormal findings: Secondary | ICD-10-CM | POA: Diagnosis not present

## 2020-05-18 DIAGNOSIS — Z820 Family history of epilepsy and other diseases of the nervous system: Secondary | ICD-10-CM | POA: Insufficient documentation

## 2020-05-18 LAB — COMPREHENSIVE METABOLIC PANEL
ALT: 39 U/L (ref 0–53)
AST: 26 U/L (ref 0–37)
Albumin: 4.5 g/dL (ref 3.5–5.2)
Alkaline Phosphatase: 89 U/L (ref 39–117)
BUN: 21 mg/dL (ref 6–23)
CO2: 29 mEq/L (ref 19–32)
Calcium: 9.8 mg/dL (ref 8.4–10.5)
Chloride: 101 mEq/L (ref 96–112)
Creatinine, Ser: 0.98 mg/dL (ref 0.40–1.50)
GFR: 81.34 mL/min (ref >60.00)
Glucose, Bld: 107 mg/dL — ABNORMAL HIGH (ref 70–99)
Potassium: 4.1 mEq/L (ref 3.5–5.1)
Sodium: 139 mEq/L (ref 135–145)
Total Bilirubin: 1.9 mg/dL — ABNORMAL HIGH (ref 0.2–1.2)
Total Protein: 6.8 g/dL (ref 6.0–8.3)

## 2020-05-18 LAB — HEMOGLOBIN A1C: Hgb A1c MFr Bld: 4.8 % (ref 4.6–6.5)

## 2020-05-18 LAB — CBC WITH DIFFERENTIAL/PLATELET
Basophils Absolute: 0 10*3/uL (ref 0.0–0.1)
Basophils Relative: 0.5 % (ref 0.0–3.0)
Eosinophils Absolute: 0.1 10*3/uL (ref 0.0–0.7)
Eosinophils Relative: 0.9 % (ref 0.0–5.0)
HCT: 46.7 % (ref 39.0–52.0)
Hemoglobin: 16.4 g/dL (ref 13.0–17.0)
Lymphocytes Relative: 22.9 % (ref 12.0–46.0)
Lymphs Abs: 1.7 10*3/uL (ref 0.7–4.0)
MCHC: 35.2 g/dL (ref 30.0–36.0)
MCV: 92.2 fl (ref 78.0–100.0)
Monocytes Absolute: 0.6 10*3/uL (ref 0.1–1.0)
Monocytes Relative: 8.2 % (ref 3.0–12.0)
Neutro Abs: 5.1 10*3/uL (ref 1.4–7.7)
Neutrophils Relative %: 67.5 % (ref 43.0–77.0)
Platelets: 166 10*3/uL (ref 150.0–400.0)
RBC: 5.06 Mil/uL (ref 4.22–5.81)
RDW: 13.3 % (ref 11.5–15.5)
WBC: 7.6 10*3/uL (ref 4.0–10.5)

## 2020-05-18 LAB — LIPID PANEL
Cholesterol: 158 mg/dL (ref 0–200)
HDL: 45.6 mg/dL (ref >39.00)
LDL Cholesterol: 90 mg/dL (ref 0–99)
NonHDL: 112.76
Total CHOL/HDL Ratio: 3
Triglycerides: 116 mg/dL (ref 0.0–149.0)
VLDL: 23.2 mg/dL (ref 0.0–40.0)

## 2020-05-18 LAB — TSH: TSH: 1.53 u[IU]/mL (ref 0.35–4.50)

## 2020-05-18 NOTE — Patient Instructions (Signed)

## 2020-05-18 NOTE — Progress Notes (Signed)
This visit occurred during the SARS-CoV-2 public health emergency.  Safety protocols were in place, including screening questions prior to the visit, additional usage of staff PPE, and extensive cleaning of exam room while observing appropriate contact time as indicated for disinfecting solutions.    Patient ID: Chris Robertson, male  DOB: [birth year], 49 y.o.   MRN: [id number] Patient Care Team    Relationship Specialty Notifications Start End  Ma Hillock, DO PCP - General Family Medicine  05/05/16     Chief Complaint  Patient presents with  . Annual Exam    Subjective: Chris Robertson is a 49 y.o.  Male  present for CPE. All past medical history, surgical history, allergies, family history, immunizations, medications and social history were updated in the electronic medical record today. All recent labs, ED visits and hospitalizations within the last year were reviewed.  Anxiety: Patient reports he has started counseling once monthly with [facility], Neldon Mc.  He feels it is going well so far.  Health maintenance:  Colonoscopy: Overdue for routine screening.  Discussed options with him today and he would like to complete Cologuard.  Testing ordered. Immunizations: tdap UTD 2015, Influenza UTD 2020 (encouraged yearly), Covid series completed Infectious disease screening: HIV patient desired testing today.  Hep C screening completed 2019 Prostate cancer screening start at 50 Assistive device: None Oxygen use: None Patient has a Dental home. Hospitalizations/ED visits: Reviewed  Depression screen [facility] 2/9 05/18/2020 02/27/2019 09/03/2018 07/08/2018 03/01/2018  Decreased Interest 1 1 2  - 1  Down, Depressed, Hopeless 1 1 1  0 2  PHQ - 2 Score 2 2 3  0 3  Altered sleeping 1 1 2 1 2   Tired, decreased energy 0 0 1 0 2  Change in appetite 0 0 2 0 0  Feeling bad or failure about yourself  0 0 0 - 0  Trouble concentrating 0 0 2 0 0  Moving slowly or fidgety/restless 0 0 2 0  1  Suicidal thoughts 0 0 0 0 0  PHQ-9 Score 3 3 12 1 8   Difficult doing work/chores Somewhat difficult - Somewhat difficult Not difficult at all Somewhat difficult   GAD 7 : Generalized Anxiety Score 02/27/2019 09/03/2018 03/01/2018 09/03/2017  Nervous, Anxious, on Edge 1 2 2 1   Control/stop worrying 0 1 0 1  Worry too much - different things 0 1 0 1  Trouble relaxing 3 2 2 1   Restless 1 2 2 2   Easily annoyed or irritable 0 1 1 1   Afraid - awful might happen 0 0 0 1  Total GAD 7 Score 5 9 7 8   Anxiety Difficulty - Very difficult Somewhat difficult Somewhat difficult    Immunization History  Administered Date(s) Administered  . Influenza, Seasonal, Injecte, Preservative Fre 10/13/2016  . Influenza,inj,Quad PF,6+ Mos 10/13/2016, 06/27/2017, 07/18/2019  . Influenza-Unspecified 07/31/2015, 10/13/2016, 06/27/2017, 07/18/2018  . PFIZER SARS-COV-2 Vaccination 11/21/2019, 12/12/2019  . Td 02/15/2010  . Tdap 10/30/2013   Past Medical History:  Diagnosis Date  . Anxiety   . Chicken pox   . Depression   . DOE (dyspnea on exertion) 10/11/2018  . Gilbert disease   . Hearing impaired    age 73  . Heart murmur    since childhood  . Kidney stone   . Mild mitral valve prolapse 2013   Mild MVP with mild MR by echo in 2013, read as trivial in 2019.  Marland Kitchen Near syncope 10/11/2018  . Panic disorder 2015   With agoraphobia  .  Rapid heart beat 10/11/2018  . Spermatocele 08/2012   hyrocele bilateral and spermatocele left; PSA normal 2013-2016  . Thrombocytopenia (HCC)    mild, chronic  . Vitamin D deficiency    No Known Allergies Past Surgical History:  Procedure Laterality Date  . CHOLECYSTECTOMY  2007  . [place]  . SHOULDER SURGERY  1997  . TRANSTHORACIC ECHOCARDIOGRAM  10/03/2018   Normal LV size and function.  EF 55 to 60%.  No regional wall motion normality.  Trivial late mitral prolapse with trivial regurgitation.  Also trivial AI, TR, PR.   Family History    Problem Relation Age of Onset  . Mental illness Mother   . Huntington's disease Mother   . Depression Mother   . Mental illness Father   . Hypertension Father   . Diabetes Father   . Hyperlipidemia Father   . Mental illness Sister   . Mental illness Brother   . Thyroid cancer Maternal Aunt   . Mental illness Maternal Aunt   . Mental illness Maternal Uncle   . Mental illness Paternal Aunt   . Mental illness Paternal Uncle   . Mental illness Maternal Grandmother   . Pancreatic cancer Maternal Grandfather   . Mental illness Maternal Grandfather   . Mental illness Paternal Grandmother   . Mental illness Paternal Grandfather    Social History   Social History Narrative   Single. No children.    Associates degree. Works full time as Financial trader for [employer].   No tobacco, drug use. Occasional ETOH.    Drink caffeine    Wears seatbelt, bike helmet   Bilateral hearing aids   Smoke detector in the home.    Feels safe in relationships.     Allergies as of 05/18/2020   No Known Allergies     Medication List       Accurate as of May 18, 2020  5:44 PM. If you have any questions, ask your nurse or doctor.        STOP taking these medications   clotrimazole 1 % cream Commonly known as: LOTRIMIN Stopped by: Howard Pouch, DO   levocetirizine 5 MG tablet Commonly known as: Xyzal Stopped by: Howard Pouch, DO     TAKE these medications   fluocinolone 0.01 % cream Commonly known as: VANOS Apply topically 2 (two) times daily.   Vitamin D 50 MCG (2000 UT) Caps       All past medical history, surgical history, allergies, family history, immunizations andmedications were updated in the EMR today and reviewed under the history and medication portions of their EMR.      Patient was never admitted.   ROS: 14 pt review of systems performed and negative (unless mentioned in an HPI)  Objective: BP (!) 135/91   Pulse 94   Temp 98.6 F (37 C) (Temporal)   Ht 6' 2"   (1.88 m)   Wt 194 lb (88 kg)   SpO2 98%   BMI 24.91 kg/m  Gen: Afebrile. No acute distress. Nontoxic in appearance, well-developed, well-nourished, pleasant male. HENT: AT. [place]. Bilateral TM visualized and normal in appearance, normal external auditory canal. MMM, no oral lesions, adequate dentition. Bilateral nares within normal limits. Throat without erythema, ulcerations or exudates.  No cough on exam, no hoarseness on exam. Eyes:Pupils Equal Round Reactive to light, Extraocular movements intact,  Conjunctiva without redness, discharge or icterus. Neck/lymp/endocrine: Supple, no lymphadenopathy, no thyromegaly CV: RRR no murmur, no edema Chest: CTAB, no  wheeze, rhonchi or crackles.  Normal respiratory effort.  Good air movement. Abd: Soft.  Flat. NTND. BS present.  No masses palpated. No hepatosplenomegaly. No rebound tenderness or guarding. Skin: No rashes, purpura or petechiae. Warm and well-perfused. Skin intact. Neuro/Msk:  Normal gait. PERLA. EOMi. Alert. Oriented x3.  Cranial nerves II through XII intact. Muscle strength 5/5 upper/lower extremity. DTRs equal bilaterally. Psych: Normal affect, dress and demeanor. Normal speech. Normal thought content and judgment.   No exam data present  Assessment/plan: Chris Robertson is a 49 y.o. male present for CPE Elevated transferritin/Family history of hemochromatosis - Comprehensive metabolic panel - Iron, TIBC and Ferritin Panel Screening for deficiency anemia - CBC with Differential/Platelet Screening cholesterol level - Lipid panel Diabetes mellitus screening - Hemoglobin A1c Depression with anxiety Established with a therapist at [facility] counseling K. Cassell - TSH Encounter for screening for HIV Patient elected for HIV testing today - HIV antibody (with reflex) Colon cancer screening - Cologuard Encounter for preventive health examination Patient was encouraged to exercise greater than 150 minutes a week. Patient was  encouraged to choose a diet filled with fresh fruits and vegetables, and lean meats. AVS provided to patient today for education/recommendation on gender specific health and safety maintenance.  Encouraged low-sodium diet and monitoring blood pressure. Colonoscopy: Overdue for routine screening.  Discussed options with him today and he would like to complete Cologuard.  Testing ordered. Immunizations: tdap UTD 2015, Influenza UTD 2020 (encouraged yearly), Covid series completed Infectious disease screening: HIV patient desired testing today.  Hep C screening completed 2019 Prostate cancer screening start at 50  Return in about 1 year (around 05/18/2021) for CPE (30 min).   Orders Placed This Encounter  Procedures  . CBC with Differential/Platelet  . Comprehensive metabolic panel  . Lipid panel  . Hemoglobin A1c  . TSH  . Iron, TIBC and Ferritin Panel  . HIV antibody (with reflex)  . Cologuard   No orders of the defined types were placed in this encounter.  Referral Orders  No referral(s) requested today     Electronically signed by: Howard Pouch, [place]

## 2020-05-19 LAB — HIV ANTIBODY (ROUTINE TESTING W REFLEX): HIV 1&2 Ab, 4th Generation: NONREACTIVE

## 2020-05-19 LAB — IRON,TIBC AND FERRITIN PANEL
Ferritin: 141 ng/mL (ref 38–380)
Iron: 161 ug/dL (ref 50–180)

## 2020-05-26 DIAGNOSIS — Z1211 Encounter for screening for malignant neoplasm of colon: Secondary | ICD-10-CM | POA: Diagnosis not present

## 2020-06-02 LAB — COLOGUARD: Cologuard: NEGATIVE

## 2020-09-12 DIAGNOSIS — R42 Dizziness and giddiness: Secondary | ICD-10-CM | POA: Insufficient documentation

## 2020-09-12 DIAGNOSIS — R112 Nausea with vomiting, unspecified: Secondary | ICD-10-CM | POA: Diagnosis not present

## 2020-09-12 DIAGNOSIS — H903 Sensorineural hearing loss, bilateral: Secondary | ICD-10-CM | POA: Diagnosis not present

## 2020-09-12 DIAGNOSIS — R61 Generalized hyperhidrosis: Secondary | ICD-10-CM | POA: Diagnosis not present

## 2020-09-12 DIAGNOSIS — T68XXXA Hypothermia, initial encounter: Secondary | ICD-10-CM | POA: Diagnosis not present

## 2020-09-12 DIAGNOSIS — I10 Essential (primary) hypertension: Secondary | ICD-10-CM | POA: Diagnosis not present

## 2020-09-12 DIAGNOSIS — Z9049 Acquired absence of other specified parts of digestive tract: Secondary | ICD-10-CM | POA: Diagnosis not present

## 2020-09-12 DIAGNOSIS — K760 Fatty (change of) liver, not elsewhere classified: Secondary | ICD-10-CM | POA: Diagnosis not present

## 2020-09-12 DIAGNOSIS — E872 Acidosis, unspecified: Secondary | ICD-10-CM | POA: Insufficient documentation

## 2020-09-12 DIAGNOSIS — Z20822 Contact with and (suspected) exposure to covid-19: Secondary | ICD-10-CM | POA: Diagnosis not present

## 2020-09-12 DIAGNOSIS — R9431 Abnormal electrocardiogram [ECG] [EKG]: Secondary | ICD-10-CM | POA: Diagnosis not present

## 2020-09-12 DIAGNOSIS — E860 Dehydration: Secondary | ICD-10-CM | POA: Diagnosis not present

## 2020-09-12 DIAGNOSIS — R1111 Vomiting without nausea: Secondary | ICD-10-CM | POA: Diagnosis not present

## 2020-09-12 DIAGNOSIS — R231 Pallor: Secondary | ICD-10-CM | POA: Diagnosis not present

## 2020-09-12 DIAGNOSIS — R7881 Bacteremia: Secondary | ICD-10-CM | POA: Diagnosis not present

## 2020-09-12 DIAGNOSIS — R531 Weakness: Secondary | ICD-10-CM | POA: Diagnosis not present

## 2020-09-12 DIAGNOSIS — R29810 Facial weakness: Secondary | ICD-10-CM | POA: Diagnosis not present

## 2020-09-12 DIAGNOSIS — Z792 Long term (current) use of antibiotics: Secondary | ICD-10-CM | POA: Diagnosis not present

## 2020-09-13 DIAGNOSIS — E872 Acidosis: Secondary | ICD-10-CM | POA: Diagnosis not present

## 2020-09-13 DIAGNOSIS — R9431 Abnormal electrocardiogram [ECG] [EKG]: Secondary | ICD-10-CM | POA: Diagnosis not present

## 2020-09-13 DIAGNOSIS — Z792 Long term (current) use of antibiotics: Secondary | ICD-10-CM | POA: Diagnosis not present

## 2020-09-13 DIAGNOSIS — K760 Fatty (change of) liver, not elsewhere classified: Secondary | ICD-10-CM | POA: Diagnosis not present

## 2020-09-13 DIAGNOSIS — Z9049 Acquired absence of other specified parts of digestive tract: Secondary | ICD-10-CM | POA: Diagnosis not present

## 2020-09-13 DIAGNOSIS — R7881 Bacteremia: Secondary | ICD-10-CM | POA: Diagnosis not present

## 2020-09-13 DIAGNOSIS — E860 Dehydration: Secondary | ICD-10-CM | POA: Diagnosis not present

## 2020-09-13 DIAGNOSIS — H903 Sensorineural hearing loss, bilateral: Secondary | ICD-10-CM | POA: Diagnosis not present

## 2020-09-13 DIAGNOSIS — R531 Weakness: Secondary | ICD-10-CM | POA: Diagnosis not present

## 2020-09-13 DIAGNOSIS — T68XXXA Hypothermia, initial encounter: Secondary | ICD-10-CM | POA: Diagnosis not present

## 2020-09-13 DIAGNOSIS — R42 Dizziness and giddiness: Secondary | ICD-10-CM | POA: Diagnosis not present

## 2020-09-13 DIAGNOSIS — R112 Nausea with vomiting, unspecified: Secondary | ICD-10-CM | POA: Diagnosis not present

## 2020-09-20 ENCOUNTER — Encounter: Payer: Self-pay | Admitting: Family Medicine

## 2020-09-20 ENCOUNTER — Ambulatory Visit: Payer: 59 | Admitting: Family Medicine

## 2020-09-20 ENCOUNTER — Other Ambulatory Visit: Payer: Self-pay

## 2020-09-20 VITALS — BP 120/76 | HR 87 | Temp 98.0°F | Ht 74.0 in | Wt 195.0 lb

## 2020-09-20 DIAGNOSIS — Z9289 Personal history of other medical treatment: Secondary | ICD-10-CM

## 2020-09-20 DIAGNOSIS — E872 Acidosis, unspecified: Secondary | ICD-10-CM

## 2020-09-20 DIAGNOSIS — R112 Nausea with vomiting, unspecified: Secondary | ICD-10-CM | POA: Diagnosis not present

## 2020-09-20 DIAGNOSIS — D72829 Elevated white blood cell count, unspecified: Secondary | ICD-10-CM | POA: Diagnosis not present

## 2020-09-20 DIAGNOSIS — E860 Dehydration: Secondary | ICD-10-CM | POA: Diagnosis not present

## 2020-09-20 DIAGNOSIS — R42 Dizziness and giddiness: Secondary | ICD-10-CM

## 2020-09-20 DIAGNOSIS — H905 Unspecified sensorineural hearing loss: Secondary | ICD-10-CM | POA: Diagnosis not present

## 2020-09-20 LAB — CBC WITH DIFFERENTIAL/PLATELET
Basophils Absolute: 0.1 10*3/uL (ref 0.0–0.1)
Basophils Relative: 0.6 % (ref 0.0–3.0)
Eosinophils Absolute: 0.1 10*3/uL (ref 0.0–0.7)
Eosinophils Relative: 1 % (ref 0.0–5.0)
HCT: 46.7 % (ref 39.0–52.0)
Hemoglobin: 16.4 g/dL (ref 13.0–17.0)
Lymphocytes Relative: 16.2 % (ref 12.0–46.0)
Lymphs Abs: 1.5 10*3/uL (ref 0.7–4.0)
MCHC: 35 g/dL (ref 30.0–36.0)
MCV: 93 fl (ref 78.0–100.0)
Monocytes Absolute: 1 10*3/uL (ref 0.1–1.0)
Monocytes Relative: 10 % (ref 3.0–12.0)
Neutro Abs: 6.9 10*3/uL (ref 1.4–7.7)
Neutrophils Relative %: 72.2 % (ref 43.0–77.0)
Platelets: 191 10*3/uL (ref 150.0–400.0)
RBC: 5.02 Mil/uL (ref 4.22–5.81)
RDW: 12.9 % (ref 11.5–15.5)
WBC: 9.5 10*3/uL (ref 4.0–10.5)

## 2020-09-20 MED ORDER — MECLIZINE HCL 12.5 MG PO TABS: 12.5000 mg | ORAL_TABLET | Freq: Two times a day (BID) | ORAL | 1 refills | Status: DC

## 2020-09-20 MED ORDER — ONDANSETRON HCL 4 MG PO TABS: 4.0000 mg | ORAL_TABLET | Freq: Three times a day (TID) | ORAL | 0 refills | Status: DC | PRN

## 2020-09-20 NOTE — Progress Notes (Signed)
Chris Robertson , 24-Jan-1971, 49 y.o., male MRN: [id number] Patient Care Team    Relationship Specialty Notifications Start End  Ma Hillock, DO PCP - General Family Medicine  05/05/16     Chief Complaint  Patient presents with  . Hospitalization Follow-up     Subjective:  Chris Robertson  is a 49 y.o. male presents for hospital follow up after recent admission on 09/12/2020 for primary diagnosis vertigo/dehydration/nausea and vomit. Patient was discharged on 09/13/2020 to home. Patients discharge summary has been reviewed, as well as all labs/image studies obtained during hospitalization.   Patients hospital course: Patient presents today after hospital admission for nausea, vomiting and vertigo.  Patient reports he had dental extraction completed that then led to severe vertigo with intractable nausea and vomiting which led to dehydration.  During hospitalization he had severe left mandibular pain.  He was unable to take in adequate oral intake to remain hydrated.  He had general malaise and called EMS which had to break down his door in order to enter home.  He was evaluated the [facility] and was found to have a white count of 23K and elevated lactic acid..  Dehydration.  He was hydrated and continued amoxicillin.  Provided with meclizine and Zofran.  White count decreased to 17 K on day of hospital discharge.  Reviewed imaging studies of ultrasound of his abdomen which showed hepatic steatosis, otherwise normal.  CT angio pulmonary which was negative for any acute pulmonary emboli.  There were no pulmonary findings outside of a small 3.4 mm nodule of the right upper lobe-which does not require routine follow-up imaging.  Chest x-ray showed no active disease. Since hospital discharge patient reports Chris Robertson. Still have some mild vertigo symptoms when laying flat. He has continued to take antivert before bed. He is hydrating 60-90 ounces a day  and eating well. He has occasional mild nausea at times with no additional vomiting.   No results for input(s): HGB, HCT, WBC, PLT in the last 168 hours.   Depression screen [facility] 2/9 09/20/2020 05/18/2020 02/27/2019 09/03/2018 07/08/2018  Decreased Interest 0 1 1 2  -  Down, Depressed, Hopeless 0 1 1 1  0  PHQ - 2 Score 0 2 2 3  0  Altered sleeping - 1 1 2 1   Tired, decreased energy - 0 0 1 0  Change in appetite - 0 0 2 0  Feeling bad or failure about yourself  - 0 0 0 -  Trouble concentrating - 0 0 2 0  Moving slowly or fidgety/restless - 0 0 2 0  Suicidal thoughts - 0 0 0 0  PHQ-9 Score - 3 3 12 1   Difficult doing work/chores - Somewhat difficult - Somewhat difficult Not difficult at all    No Known Allergies Social History   Tobacco Use  . Smoking status: Never Smoker  . Smokeless tobacco: Never Used  Substance Use Topics  . Alcohol use: Yes    Alcohol/week: 2.0 standard drinks    Types: 2 Glasses of wine per week   Past Medical History:  Diagnosis Date  . Anxiety   . Chicken pox   . Depression   . DOE (dyspnea on exertion) 10/11/2018  . Gilbert disease   . Hearing impaired    age 60  . Heart murmur    since childhood  . Kidney stone   . Mild mitral valve prolapse 2013   Mild MVP with mild  MR by echo in 2013, read as trivial in 2019.  Marland Kitchen Near syncope 10/11/2018  . Panic disorder 2015   With agoraphobia  . Rapid heart beat 10/11/2018  . Spermatocele 08/2012   hyrocele bilateral and spermatocele left; PSA normal 2013-2016  . Thrombocytopenia (HCC)    mild, chronic  . Vitamin D deficiency    Past Surgical History:  Procedure Laterality Date  . CHOLECYSTECTOMY  2007  . [place]  . SHOULDER SURGERY  1997  . TOOTH EXTRACTION    . TRANSTHORACIC ECHOCARDIOGRAM  10/03/2018   Normal LV size and function.  EF 55 to 60%.  No regional wall motion normality.  Trivial late mitral prolapse with trivial regurgitation.  Also trivial AI, TR, PR.   Family  History  Problem Relation Age of Onset  . Mental illness Mother   . Huntington's disease Mother   . Depression Mother   . Mental illness Father   . Hypertension Father   . Diabetes Father   . Hyperlipidemia Father   . Mental illness Sister   . Mental illness Brother   . Thyroid cancer Maternal Aunt   . Mental illness Maternal Aunt   . Mental illness Maternal Uncle   . Mental illness Paternal Aunt   . Mental illness Paternal Uncle   . Mental illness Maternal Grandmother   . Pancreatic cancer Maternal Grandfather   . Mental illness Maternal Grandfather   . Mental illness Paternal Grandmother   . Mental illness Paternal Grandfather    Allergies as of 09/20/2020   No Known Allergies     Medication List       Accurate as of September 20, 2020 10:02 AM. If you have any questions, ask your nurse or doctor.        STOP taking these medications   fluocinolone 0.01 % cream Commonly known as: VANOS Stopped by: Howard Pouch, DO   traMADol 50 MG tablet Commonly known as: ULTRAM Stopped by: Howard Pouch, DO     TAKE these medications   meclizine 12.5 MG tablet Commonly known as: ANTIVERT Take 1 tablet (12.5 mg total) by mouth 2 (two) times daily. What changed:   how much to take  how to take this  when to take this Changed by: Howard Pouch, DO   ondansetron 4 MG tablet Commonly known as: ZOFRAN Take 1 tablet (4 mg total) by mouth every 8 (eight) hours as needed for nausea or vomiting. Started by: Howard Pouch, DO   Vitamin D 1 MCG (2000 UT) Caps       All past medical history, surgical history, allergies, family history, immunizations and medications were updated in the EMR today and reviewed under the history and medication portions of their EMR.      ROS: Negative, with the exception of above mentioned in HPI   Objective:  BP 120/76   Pulse 87   Temp 98 F (36.7 C) (Oral)   Ht 6' 2"  (1.88 m)   Wt 195 lb (88.5 kg)   SpO2 99%   BMI 25.04 kg/m  Body  mass index is 25.04 kg/m. Gen: Afebrile. No acute distress. Nontoxic in appearance, well developed, well nourished.  HENT: AT. [place]. MMM, no oral lesions.bilateral hearing aids in place. No gumline redness or drainage.  Eyes:Pupils Equal Round Reactive to light, Extraocular movements intact,  Conjunctiva without redness, discharge or icterus. Neck/lymp/endocrine: Supple,no lymphadenopathy CV: RRR no murmur Chest: CTAB, no wheeze or crackles. Good air movement, normal resp  effort.  Neuro: Normal gait. PERLA. EOMi. Alert. Oriented x3  Psych: Normal affect, dress and demeanor. Normal speech. Normal thought content and judgment.  Assessment/Plan: Chris Robertson is a 49 y.o. male present for OV for Hospital discharge follow up Vertigo/Dehydration/Intractable nausea and vomiting/History of recent hospitalization - recovering well. Still having some mild vertigo symptoms at night.  - Hydrate> at least 90 ounces of water daily.  - CBC w/Diff - Ambulatory referral to ENT - zofran and meclizine prescribed.  - f/u prn  Leukocytosis, unspecified type/ Lactic acidemia He finished abx. WBC was decreasing but still elevated on dc - CBC w/Diff  Congenital hearing loss of both ears/vertigo - Ambulatory referral to ENT   Reviewed expectations re: course of current medical issues.  Discussed self-management of symptoms.  Outlined signs and symptoms indicating need for more acute intervention.  Patient verbalized understanding and all questions were answered.  Patient received an After-Visit Summary.  Any changes in medications were reviewed and patient was provided with updated med list with their AVS.      Orders Placed This Encounter  Procedures  . CBC w/Diff  . Ambulatory referral to ENT     Note is dictated utilizing voice recognition software. Although note has been proof read prior to signing, occasional typographical errors still can be missed. If any questions arise, please do not  hesitate to call for verification.   electronically signed by:  Howard Pouch, DO  [place]

## 2020-09-20 NOTE — Patient Instructions (Signed)
I have called in more meclizine and zofran (nausea med) and sent to your pharmacy. I told them to hold until you request.   I have referred you back to your ENT also.   We will call you with lab results- and directions once we get the results.

## 2020-09-22 DIAGNOSIS — I809 Phlebitis and thrombophlebitis of unspecified site: Secondary | ICD-10-CM | POA: Diagnosis not present

## 2020-09-22 DIAGNOSIS — Z03823 Encounter for observation for suspected inserted (injected) foreign body ruled out: Secondary | ICD-10-CM | POA: Diagnosis not present

## 2020-09-22 DIAGNOSIS — I82612 Acute embolism and thrombosis of superficial veins of left upper extremity: Secondary | ICD-10-CM | POA: Diagnosis not present

## 2020-09-22 DIAGNOSIS — M79602 Pain in left arm: Secondary | ICD-10-CM | POA: Diagnosis not present

## 2020-10-05 ENCOUNTER — Other Ambulatory Visit: Payer: Self-pay | Admitting: Physician Assistant

## 2020-10-05 ENCOUNTER — Other Ambulatory Visit: Payer: Self-pay | Admitting: Psychiatry

## 2020-10-05 ENCOUNTER — Other Ambulatory Visit (HOSPITAL_COMMUNITY): Payer: Self-pay | Admitting: Physician Assistant

## 2020-10-05 DIAGNOSIS — R42 Dizziness and giddiness: Secondary | ICD-10-CM | POA: Diagnosis not present

## 2020-10-05 DIAGNOSIS — H905 Unspecified sensorineural hearing loss: Secondary | ICD-10-CM | POA: Diagnosis not present

## 2020-12-08 ENCOUNTER — Telehealth: Payer: Self-pay

## 2020-12-08 NOTE — Telephone Encounter (Signed)
Okay with me.  Very nice patient.

## 2020-12-08 NOTE — Telephone Encounter (Signed)
Ok with me 

## 2020-12-08 NOTE — Telephone Encounter (Signed)
Pt is requesting to transfer care from Dr. Raoul Pitch to Inda Coke. Pt is wanting to see a provider that is closer to his home and work. Ok to transfer? Please advise.

## 2020-12-08 NOTE — Telephone Encounter (Signed)
Pt is scheduled for 2/14.

## 2020-12-09 ENCOUNTER — Telehealth: Payer: Self-pay

## 2020-12-09 NOTE — Telephone Encounter (Signed)
Spoke with pt that needs refill for meclizine. Pt has seen ENT12/2021. Please advise if ok to refill

## 2020-12-09 NOTE — Telephone Encounter (Signed)
LVM for pt to CB regarding rx refill per pharm.   Note: Would like to confirm that pt indeed need refill prior to any appts

## 2020-12-10 MED ORDER — MECLIZINE HCL 12.5 MG PO TABS
12.5000 mg | ORAL_TABLET | Freq: Two times a day (BID) | ORAL | 3 refills | Status: DC
Start: 2020-12-10 — End: 2021-02-23

## 2020-12-10 NOTE — Telephone Encounter (Signed)
Please inform pt I have refilled his meclizine (and added a few refills on the new script as well). Further refills should come from new primary team when he establishes, since he is transferring to another [place] location closer to his home.     Thanks.

## 2020-12-10 NOTE — Telephone Encounter (Signed)
Spoke with pt and inform pt of provider's instructions

## 2020-12-10 NOTE — Addendum Note (Signed)
Addended by: Howard Pouch A on: 12/10/2020 08:01 AM   Modules accepted: Orders

## 2020-12-13 ENCOUNTER — Other Ambulatory Visit: Payer: Self-pay

## 2020-12-13 ENCOUNTER — Ambulatory Visit (INDEPENDENT_AMBULATORY_CARE_PROVIDER_SITE_OTHER): Payer: 59 | Admitting: Physician Assistant

## 2020-12-13 ENCOUNTER — Encounter: Payer: Self-pay | Admitting: Physician Assistant

## 2020-12-13 VITALS — BP 110/70 | HR 81 | Temp 98.0°F | Ht 74.0 in | Wt 204.5 lb

## 2020-12-13 DIAGNOSIS — N433 Hydrocele, unspecified: Secondary | ICD-10-CM

## 2020-12-13 DIAGNOSIS — R21 Rash and other nonspecific skin eruption: Secondary | ICD-10-CM | POA: Diagnosis not present

## 2020-12-13 MED ORDER — TRIAMCINOLONE ACETONIDE 0.5 % EX OINT: TOPICAL_OINTMENT | CUTANEOUS | 1 refills | Status: DC

## 2020-12-13 NOTE — Patient Instructions (Signed)
It was great to see you!  Trial the ointment for your rash. If no improvement or worsening, follow-up in 2 weeks.  I'm putting in a referral for you to see Dr. Arnette Schaumann at [facility]. Their office will be in touch with scheduling this for you.  Let's follow-up in the fall for a physical, sooner if you have concerns.  If a referral was placed today, you will be contacted for an appointment. Please note that routine referrals can sometimes take up to 3-4 weeks to process. Please call our office if you haven't heard anything after this time frame.  Take care,  Inda Coke PA-C
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
## 2021-01-07 DIAGNOSIS — N50812 Left testicular pain: Secondary | ICD-10-CM | POA: Diagnosis not present

## 2021-01-31 DIAGNOSIS — N503 Cyst of epididymis: Secondary | ICD-10-CM | POA: Diagnosis not present

## 2021-01-31 DIAGNOSIS — N50812 Left testicular pain: Secondary | ICD-10-CM | POA: Diagnosis not present

## 2021-02-09 DIAGNOSIS — M6289 Other specified disorders of muscle: Secondary | ICD-10-CM | POA: Diagnosis not present

## 2021-02-09 DIAGNOSIS — N50812 Left testicular pain: Secondary | ICD-10-CM | POA: Diagnosis not present

## 2021-02-09 DIAGNOSIS — M62838 Other muscle spasm: Secondary | ICD-10-CM | POA: Diagnosis not present

## 2021-02-09 DIAGNOSIS — M6281 Muscle weakness (generalized): Secondary | ICD-10-CM | POA: Diagnosis not present

## 2021-02-17 DIAGNOSIS — M62838 Other muscle spasm: Secondary | ICD-10-CM | POA: Diagnosis not present

## 2021-02-17 DIAGNOSIS — M6289 Other specified disorders of muscle: Secondary | ICD-10-CM | POA: Diagnosis not present

## 2021-02-17 DIAGNOSIS — M6281 Muscle weakness (generalized): Secondary | ICD-10-CM | POA: Diagnosis not present

## 2021-02-17 DIAGNOSIS — N50812 Left testicular pain: Secondary | ICD-10-CM | POA: Diagnosis not present

## 2021-02-22 ENCOUNTER — Telehealth: Payer: Self-pay

## 2021-02-22 NOTE — Telephone Encounter (Addendum)
  LAST APPOINTMENT DATE: 12/13/2020   NEXT APPOINTMENT DATE:@9 /16/2022  MEDICATION:meclizine (ANTIVERT) 12.5 MG tablet  [facility], [place], [place] [id number]  Comments: Patient states he will like this to be his preferred pharmacy.  Please advise

## 2021-02-23 MED ORDER — MECLIZINE HCL 12.5 MG PO TABS: 12.5000 mg | ORAL_TABLET | Freq: Two times a day (BID) | ORAL | 3 refills | Status: DC

## 2021-02-23 NOTE — Telephone Encounter (Signed)
Spoke to pt told him I have sent Rx for Meclizine to new pharmacy as requested. Pt verbalized understanding and said he is having trouble with vertigo. Told pt if symptoms do not improve please schedule an appt. Pt verbalized understanding.

## 2021-04-12 ENCOUNTER — Other Ambulatory Visit: Payer: Self-pay | Admitting: Physician Assistant

## 2021-06-11 ENCOUNTER — Other Ambulatory Visit: Payer: Self-pay | Admitting: Family Medicine

## 2021-07-15 ENCOUNTER — Other Ambulatory Visit: Payer: Self-pay

## 2021-07-15 ENCOUNTER — Ambulatory Visit (INDEPENDENT_AMBULATORY_CARE_PROVIDER_SITE_OTHER): Payer: 59 | Admitting: Physician Assistant

## 2021-07-15 ENCOUNTER — Encounter: Payer: Self-pay | Admitting: Physician Assistant

## 2021-07-15 VITALS — BP 130/80 | HR 82 | Temp 97.9°F | Ht 74.0 in | Wt 216.4 lb

## 2021-07-15 DIAGNOSIS — Z0000 Encounter for general adult medical examination without abnormal findings: Secondary | ICD-10-CM

## 2021-07-15 DIAGNOSIS — R42 Dizziness and giddiness: Secondary | ICD-10-CM | POA: Diagnosis not present

## 2021-07-15 DIAGNOSIS — Z136 Encounter for screening for cardiovascular disorders: Secondary | ICD-10-CM

## 2021-07-15 DIAGNOSIS — Z13220 Encounter for screening for lipoid disorders: Secondary | ICD-10-CM

## 2021-07-15 LAB — COMPREHENSIVE METABOLIC PANEL
ALT: 40 U/L (ref 0–53)
AST: 28 U/L (ref 0–37)
Albumin: 4.2 g/dL (ref 3.5–5.2)
Alkaline Phosphatase: 89 U/L (ref 39–117)
BUN: 19 mg/dL (ref 6–23)
CO2: 26 mEq/L (ref 19–32)
Calcium: 9.6 mg/dL (ref 8.4–10.5)
Chloride: 104 mEq/L (ref 96–112)
Creatinine, Ser: 1.06 mg/dL (ref 0.40–1.50)
GFR: 82.12 mL/min (ref >60.00)
Glucose, Bld: 116 mg/dL — ABNORMAL HIGH (ref 70–99)
Potassium: 3.6 mEq/L (ref 3.5–5.1)
Sodium: 138 mEq/L (ref 135–145)
Total Bilirubin: 1.4 mg/dL — ABNORMAL HIGH (ref 0.2–1.2)
Total Protein: 6.9 g/dL (ref 6.0–8.3)

## 2021-07-15 LAB — LIPID PANEL
Cholesterol: 170 mg/dL (ref 0–200)
HDL: 37.8 mg/dL — ABNORMAL LOW (ref >39.00)
LDL Cholesterol: 100 mg/dL — ABNORMAL HIGH (ref 0–99)
NonHDL: 132.08
Total CHOL/HDL Ratio: 4
Triglycerides: 160 mg/dL — ABNORMAL HIGH (ref 0.0–149.0)
VLDL: 32 mg/dL (ref 0.0–40.0)

## 2021-07-15 LAB — CBC WITH DIFFERENTIAL/PLATELET
Basophils Absolute: 0 10*3/uL (ref 0.0–0.1)
Basophils Relative: 0.5 % (ref 0.0–3.0)
Eosinophils Absolute: 0.1 10*3/uL (ref 0.0–0.7)
Eosinophils Relative: 1.1 % (ref 0.0–5.0)
HCT: 46 % (ref 39.0–52.0)
Hemoglobin: 15.7 g/dL (ref 13.0–17.0)
Lymphocytes Relative: 22 % (ref 12.0–46.0)
Lymphs Abs: 1.6 10*3/uL (ref 0.7–4.0)
MCHC: 34.1 g/dL (ref 30.0–36.0)
MCV: 92.4 fl (ref 78.0–100.0)
Monocytes Absolute: 0.7 10*3/uL (ref 0.1–1.0)
Monocytes Relative: 9.1 % (ref 3.0–12.0)
Neutro Abs: 5 10*3/uL (ref 1.4–7.7)
Neutrophils Relative %: 67.3 % (ref 43.0–77.0)
Platelets: 167 10*3/uL (ref 150.0–400.0)
RBC: 4.97 Mil/uL (ref 4.22–5.81)
RDW: 12.9 % (ref 11.5–15.5)
WBC: 7.4 10*3/uL (ref 4.0–10.5)

## 2021-07-15 NOTE — Patient Instructions (Signed)
It was great to see you!  Please go to the lab for blood work.   Our office will call you with your results unless you have chosen to receive results via MyChart.  If your blood work is normal we will follow-up each year for physicals and as scheduled for chronic medical problems.  If anything is abnormal we will treat accordingly and get you in for a follow-up.  Take care,  Aldona Bar

## 2021-07-15 NOTE — Progress Notes (Signed)
Chris Robertson is a 50 y.o. male here for a physical.  History of Present Illness:   Chief Complaint  Patient presents with   Annual Exam    Not fasting    HPI  Vertigo/tinnitus He is having episode of vertigo associated with his tooth pain. He has seen the dentist but they don't want to perform surgery on the tooth.. He has had symptoms of vertigo for 3 years. He reports his tinnitus has resolved. He has seen multiple dentists and he went to the ENT and he had a CAT scan and no major findings were found.   Lifestyle He consumes a generally healthy diet including salads and low fat recipes. He occasionally walks associated with work but he cant do intense exercises because his vertigo is triggered. He reports sleeping well. Occassionally he has restless nights.  He has been sober from alcohol for 3 years.  Screenings Cologuard- 05/26/20 with normal results   Vaccines Flu- due at this time; he will get at work COVID- 12/12/19 he will consider the bivalent COVID vaccine Tdap 10/30/13 Past Medical History:  Diagnosis Date   Anxiety    Chicken pox    Depression    DOE (dyspnea on exertion) 10/11/2018   Rosanna Randy disease    Hearing impaired    age 45   Heart murmur    since childhood   Kidney stone    Mild mitral valve prolapse 2013   Mild MVP with mild MR by echo in 2013, read as trivial in 2019.   Near syncope 10/11/2018   Panic disorder 2015   With agoraphobia   Rapid heart beat 10/11/2018   Spermatocele 08/2012   hyrocele bilateral and spermatocele left; PSA normal 2013-2016   Thrombocytopenia (HCC)    mild, chronic   Vitamin D deficiency      Social History   Tobacco Use   Smoking status: Never   Smokeless tobacco: Never  Vaping Use   Vaping Use: Never used  Substance Use Topics   Alcohol use: Not Currently    Comment: Sober since September 2019   Drug use: No    Past Surgical History:  Procedure Laterality Date   CHOLECYSTECTOMY  2007   [place]   TOOTH EXTRACTION     TRANSTHORACIC ECHOCARDIOGRAM  10/03/2018   Normal LV size and function.  EF 55 to 60%.  No regional wall motion normality.  Trivial late mitral prolapse with trivial regurgitation.  Also trivial AI, TR, PR.    Family History  Problem Relation Age of Onset   Mental illness Mother    Huntington's disease Mother    Depression Mother    Mental illness Father    Hypertension Father    Diabetes Father    Hyperlipidemia Father    Depression Father    Obesity Father    Mental illness Sister    Depression Sister    Mental illness Brother    Thyroid cancer Maternal Aunt    Mental illness Maternal Aunt    Mental illness Maternal Uncle    Mental illness Paternal Aunt    Mental illness Paternal Uncle    Mental illness Maternal Grandmother    Pancreatic cancer Maternal Grandfather    Mental illness Maternal Grandfather    Mental illness Paternal Grandmother    Mental illness Paternal Grandfather     No Known Allergies  Current Medications:   Current Outpatient Medications:    Cholecalciferol (  VITAMIN D) 2000 units CAPS, , Disp: , Rfl:    meclizine (ANTIVERT) 12.5 MG tablet, TAKE 1 TABLET BY MOUTH TWICE A DAY (Patient taking differently: Take 12.5 mg by mouth 2 (two) times daily as needed.), Disp: 30 tablet, Rfl: 3   Multiple Vitamin (MULTIVITAMIN) tablet, Take 1 tablet by mouth daily., Disp: , Rfl:    Review of Systems:   ROS Negative unless otherwise specified per HPI.  Vitals:   Vitals:   07/15/21 0859  BP: 130/80  Pulse: 82  Temp: 97.9 F (36.6 C)  TempSrc: Temporal  SpO2: 99%  Weight: 216 lb 6.1 oz (98.1 kg)  Height: 6' 2"  (1.88 m)     Body mass index is 27.78 kg/m.  Physical Exam:   Physical Exam Vitals and nursing note reviewed.  Constitutional:      Appearance: Normal appearance. He is well-developed.  HENT:     Head: Normocephalic and atraumatic.     Right Ear: Tympanic membrane, ear canal  and external ear normal. Tympanic membrane is not erythematous, retracted or bulging.     Left Ear: Tympanic membrane, ear canal and external ear normal. Tympanic membrane is not erythematous, retracted or bulging.     Nose: Nose normal.     Mouth/Throat:     Pharynx: Uvula midline. No posterior oropharyngeal erythema.  Eyes:     Pupils: Pupils are equal, round, and reactive to light.  Cardiovascular:     Rate and Rhythm: Normal rate and regular rhythm.     Heart sounds: Normal heart sounds.  Pulmonary:     Effort: Pulmonary effort is normal.     Breath sounds: Normal breath sounds.  Abdominal:     General: Bowel sounds are normal. There is no distension.     Palpations: Abdomen is soft. There is no mass.     Tenderness: There is no abdominal tenderness. There is no guarding or rebound.  Musculoskeletal:        General: Normal range of motion.     Cervical back: Normal range of motion and neck supple.  Lymphadenopathy:     Cervical: No cervical adenopathy.  Skin:    General: Skin is warm and dry.  Neurological:     Mental Status: He is alert and oriented to person, place, and time.     Cranial Nerves: No cranial nerve deficit.     Coordination: Coordination normal.     Deep Tendon Reflexes: Reflexes are normal and symmetric. Reflexes normal.  Psychiatric:        Behavior: Behavior normal.        Thought Content: Thought content normal.        Judgment: Judgment normal.    Assessment and Plan:   1. Routine physical examination Today patient counseled on age appropriate routine health concerns for screening and prevention, each reviewed and up to date or declined. Immunizations reviewed and up to date or declined. Labs ordered and reviewed. Risk factors for depression reviewed and negative. Hearing function and visual acuity are intact. ADLs screened and addressed as needed. Functional ability and level of safety reviewed and appropriate. Education, counseling and referrals  performed based on assessed risks today. Patient provided with a copy of personalized plan for preventive services.  2. Vertigo Has had significant work-up for this in the past Denies any interventions from our office today  3. Encounter for lipid screening for cardiovascular disease Update lipid panel and provide recommendations accordingly  I,Essence Turner,acting as a scribe for [employer],  PA.,have documented all relevant documentation on the behalf of Inda Coke, PA,as directed by  Inda Coke, PA while in the presence of Inda Coke, [place].  I, Inda Coke, [place], have reviewed all documentation for this visit. The documentation on 07/15/21 for the exam, diagnosis, procedures, and orders are all accurate and complete.  Inda Coke, PA-C

## 2021-07-18 ENCOUNTER — Encounter: Payer: Self-pay | Admitting: Physician Assistant

## 2021-07-18 DIAGNOSIS — R42 Dizziness and giddiness: Secondary | ICD-10-CM

## 2021-07-19 ENCOUNTER — Encounter: Payer: Self-pay | Admitting: Neurology

## 2021-08-12 ENCOUNTER — Other Ambulatory Visit: Payer: Self-pay | Admitting: Physician Assistant

## 2021-08-23 NOTE — Progress Notes (Signed)
NEUROLOGY CONSULTATION NOTE  Chris Robertson MRN: [id number] DOB: November 22, 1970  Referring provider: Inda Coke, PA-C Primary care provider: Inda Coke, PA-C  Reason for consult:  vertigo  Assessment/Plan:   Questionable left V3 trigeminal neuralgia Vertigo, unclear if may be secondary to the facial neuralgia  1  MRI of brain and left trigeminal nerve with and without contrast 2  Ultimately, management may just be medication (such as with an anticonvulsant or antidepressant).  He would rather avoid medications.  If the pain is indeed odontogenic, he may inquire from an oral surgeon if any localize nerve block may be beneficial. 3  Further recommendations pending results.  Follow up after testing.   Subjective:  Chris Robertson is a 50 year old male with Rosanna Randy disease, heart murmur, sensorineural hearing loss since birth secondary to rubella, and panic attacks who presents for vertigo.  History supplemented by ENT and referring provider's notes.  He reports first episode of vertigo occurred in 2014.  At that time, he had tooth pain on the right.  He had a root canal and vertigo resolved.  Around August 2021, he developed recurrence of vertigo and left sided lower facial pain.  He noted pain and discomfort of the left first molar, second molar and second bicuspid.  Aggravating those teeth but poking or biting down would cause shooting pain radiating up the left side of his lower jaw to behind the left ear and down the left side of his neck.  He initially had some tingling around those teeth as well.  He also developed vertigo which would occur with sudden head movements or if the pain was aggravated.  He went to several dentists and oral surgeons.  He states that visual inspection and imaging revealed no abnormalities of those teeth.  However, he had an oral surgeon extract the second molar in October 2021 and noted reduction in intensity of both the vertigo and facial pain.  Gradually,  the pain at the location of his second molar resolved.  He wanted the first molar and second bicuspid extracted as well, but nobody would do it.  Finally, in early-mid 2022, he had a filling in his second bicuspid and noted further reduction in intensity of the vertigo.  He reports occasional pain flare-up involving the first molar and second bicuspid, particularly when touching or prodding the teeth.  He takes meclizine twice a day which provides mild relief in dizziness.  He has seen ENT who has not identified a clear diagnosis.   PAST MEDICAL HISTORY: Past Medical History:  Diagnosis Date   Anxiety    Chicken pox    Depression    DOE (dyspnea on exertion) 10/11/2018   Rosanna Randy disease    Hearing impaired    age 50   Heart murmur    since childhood   Kidney stone    Mild mitral valve prolapse 2013   Mild MVP with mild MR by echo in 2013, read as trivial in 2019.   Near syncope 10/11/2018   Panic disorder 2015   With agoraphobia   Rapid heart beat 10/11/2018   Spermatocele 08/2012   hyrocele bilateral and spermatocele left; PSA normal 2013-2016   Thrombocytopenia (HCC)    mild, chronic   Vitamin D deficiency     PAST SURGICAL HISTORY: Past Surgical History:  Procedure Laterality Date   CHOLECYSTECTOMY  2007   [place]   TOOTH EXTRACTION     TRANSTHORACIC ECHOCARDIOGRAM  10/03/2018   Normal LV size and function.  EF 55 to 60%.  No regional wall motion normality.  Trivial late mitral prolapse with trivial regurgitation.  Also trivial AI, TR, PR.    MEDICATIONS: Current Outpatient Medications on File Prior to Visit  Medication Sig Dispense Refill   Cholecalciferol (VITAMIN D) 2000 units CAPS      meclizine (ANTIVERT) 12.5 MG tablet TAKE 1 TABLET BY MOUTH TWICE A DAY 20 tablet 1   Multiple Vitamin (MULTIVITAMIN) tablet Take 1 tablet by mouth daily.     No current facility-administered medications on file prior to visit.     ALLERGIES: No Known Allergies  FAMILY HISTORY: Family History  Problem Relation Age of Onset   Mental illness Mother    Huntington's disease Mother    Depression Mother    Mental illness Father    Hypertension Father    Diabetes Father    Hyperlipidemia Father    Depression Father    Obesity Father    Mental illness Sister    Depression Sister    Mental illness Brother    Thyroid cancer Maternal Aunt    Mental illness Maternal Aunt    Mental illness Maternal Uncle    Mental illness Paternal Aunt    Mental illness Paternal Uncle    Mental illness Maternal Grandmother    Pancreatic cancer Maternal Grandfather    Mental illness Maternal Grandfather    Mental illness Paternal Grandmother    Mental illness Paternal Grandfather     Objective:  Blood pressure (!) 143/90, pulse 86, height 6' 2"  (1.88 m), weight 220 lb 6.4 oz (100 kg), SpO2 97 %. General: No acute distress.  Patient appears well-groomed.   Head:  Normocephalic/atraumatic Eyes:  fundi examined but not visualized Neck: supple, no paraspinal tenderness, full range of motion Back: No paraspinal tenderness Heart: regular rate and rhythm Lungs: Clear to auscultation bilaterally. Vascular: No carotid bruits. Neurological Exam: Mental status: alert and oriented to person, place, and time, recent and remote memory intact, fund of knowledge intact, attention and concentration intact, speech fluent and not dysarthric, language intact. Cranial nerves: CN I: not tested CN II: pupils equal, round and reactive to light, visual fields intact CN III, IV, VI:  full range of motion, no nystagmus, no ptosis CN V: facial sensation intact. CN VII: upper and lower face symmetric CN VIII: hearing intact CN IX, X: gag intact, uvula midline CN XI: sternocleidomastoid and trapezius muscles intact CN XII: tongue midline Bulk & Tone: normal, no fasciculations. Motor:  muscle strength 5/5 throughout Sensation:  Pinprick,  temperature and vibratory sensation intact. Deep Tendon Reflexes:  2+ throughout,  toes downgoing.   Finger to nose testing:  Without dysmetria.   Heel to shin:  Without dysmetria.   Gait:  Normal station and stride.  Romberg negative.    Thank you for allowing me to take part in the care of this patient.  Metta Clines, DO  CC: Inda Coke, PA-C

## 2021-08-24 ENCOUNTER — Encounter: Payer: Self-pay | Admitting: Neurology

## 2021-08-24 ENCOUNTER — Ambulatory Visit: Payer: 59 | Admitting: Neurology

## 2021-08-24 ENCOUNTER — Other Ambulatory Visit: Payer: Self-pay

## 2021-08-24 VITALS — BP 143/90 | HR 86 | Ht 74.0 in | Wt 220.4 lb

## 2021-08-24 DIAGNOSIS — R6884 Jaw pain: Secondary | ICD-10-CM

## 2021-08-24 DIAGNOSIS — G500 Trigeminal neuralgia: Secondary | ICD-10-CM | POA: Diagnosis not present

## 2021-08-24 DIAGNOSIS — R42 Dizziness and giddiness: Secondary | ICD-10-CM

## 2021-08-24 NOTE — Patient Instructions (Signed)
I would like to image the brain and trigeminal nerve - will get MRI of brain and left trigeminal nerve with and without contrast.  Further recommendations pending results.  Follow up after testing.

## 2021-09-05 ENCOUNTER — Other Ambulatory Visit: Payer: Self-pay | Admitting: Physician Assistant

## 2021-09-06 ENCOUNTER — Other Ambulatory Visit (HOSPITAL_COMMUNITY): Payer: Self-pay

## 2021-09-18 ENCOUNTER — Ambulatory Visit
Admission: RE | Admit: 2021-09-18 | Discharge: 2021-09-18 | Disposition: A | Discharge status: Home / Self Care | Payer: 59 | Source: Ambulatory Visit | Attending: Neurology | Admitting: Neurology

## 2021-09-18 ENCOUNTER — Other Ambulatory Visit: Payer: Self-pay

## 2021-09-18 DIAGNOSIS — G936 Cerebral edema: Secondary | ICD-10-CM | POA: Diagnosis not present

## 2021-09-18 DIAGNOSIS — R42 Dizziness and giddiness: Secondary | ICD-10-CM | POA: Diagnosis not present

## 2021-09-18 DIAGNOSIS — R6884 Jaw pain: Secondary | ICD-10-CM

## 2021-09-18 DIAGNOSIS — G500 Trigeminal neuralgia: Secondary | ICD-10-CM

## 2021-09-18 DIAGNOSIS — J3489 Other specified disorders of nose and nasal sinuses: Secondary | ICD-10-CM | POA: Diagnosis not present

## 2021-09-18 IMAGING — MR MR FACE/TRIGEMINAL WO/W CM
4 of 8 series · 16 of 48 positions shown · IV contrast (multihance)
Comparison: Brain MRI the same day reported separately.
COMPARISON: Brain MRI the same day reported separately.

Addendum:
CLINICAL DATA: 50-year-old male with left side face and mandible
pain. Left side trigeminal neuralgia. Vertigo. Symptoms for 2 years
recently progresses. No known malignancy.

EXAM:
MRI FACE TRIGEMINAL WITHOUT AND WITH CONTRAST
TECHNIQUE: Multiplanar, multi-echo pulse sequences of the face and surrounding
structures, including thin-slice imaging of the trigeminal nerves,
were acquired before and after intravenous contrast administration.
CONTRAST:  20mL MULTIHANCE GADOBENATE DIMEGLUMINE 529 MG/ML IV SOLN

[Series 3: T2 · coronal · 3.0mm · 0.35mm/px · 5 of 45 slices shown]
[im 1/45]
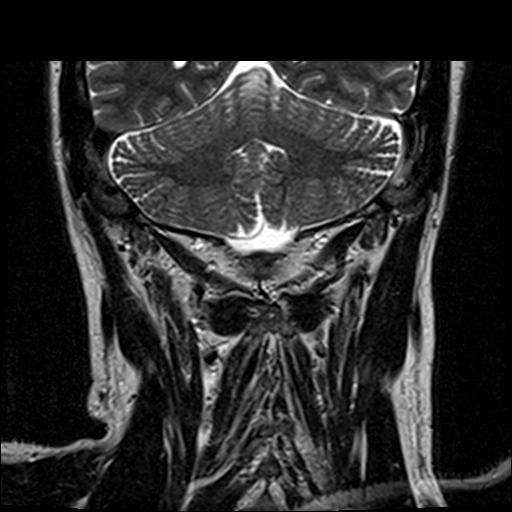
[im 12/45]
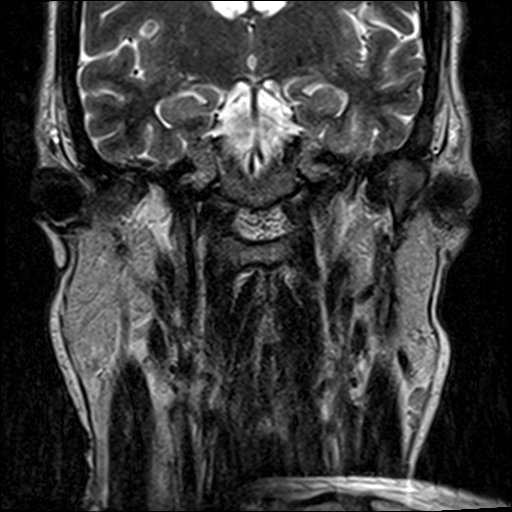
[im 23/45]
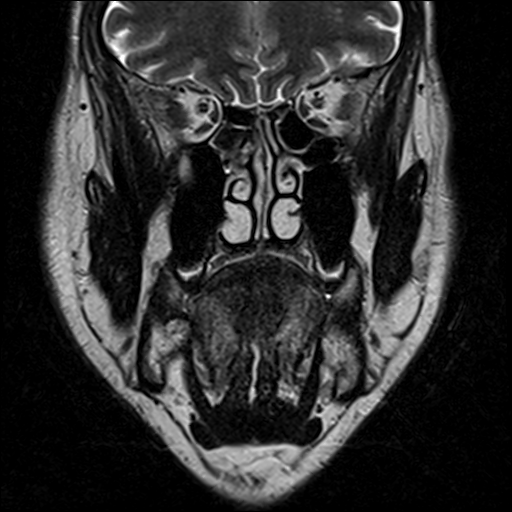
[im 34/45]
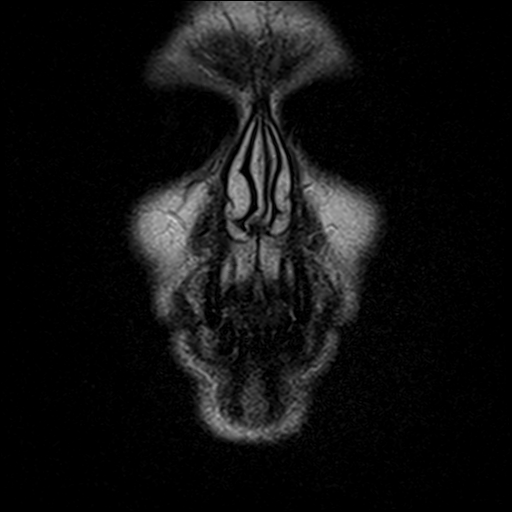
[im 45/45]
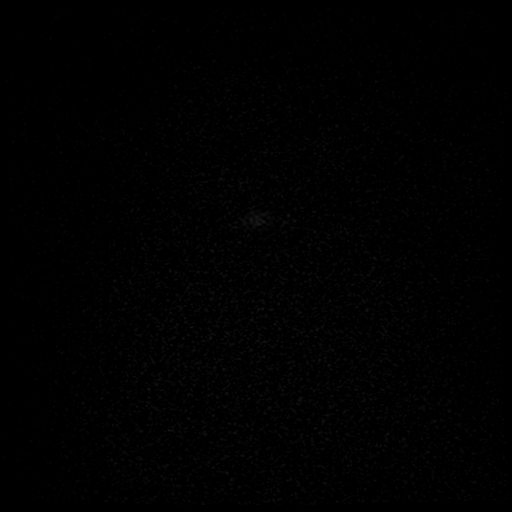

[Series 5: T1 fat-sat · coronal · 3.0mm · 0.37mm/px · 5 of 48 slices shown (1 of 2)]
[im 1/48]
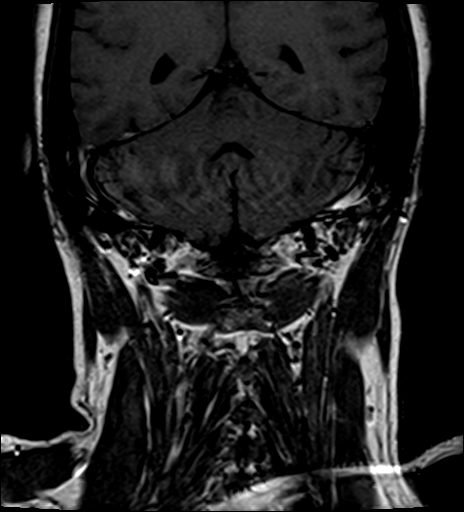
[im 12/48]
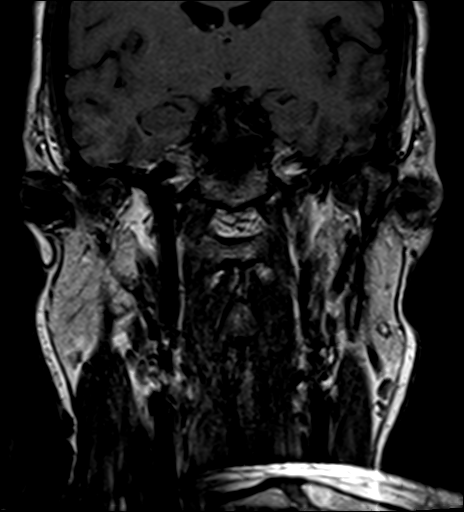
[im 24/48]
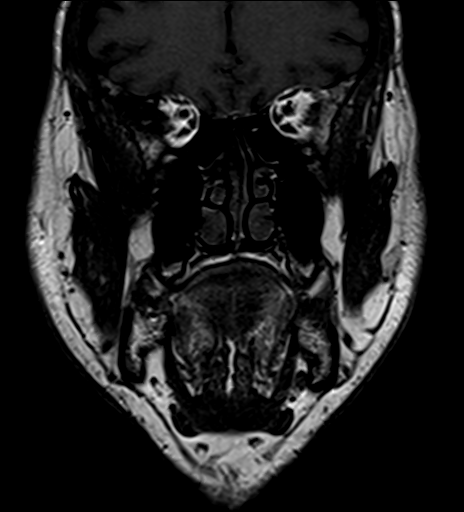
[im 36/48]
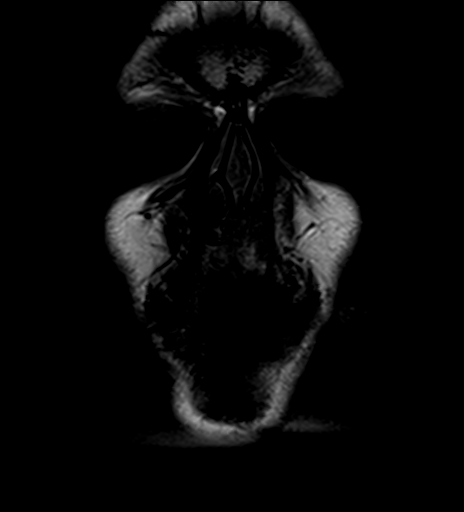
[im 48/48]
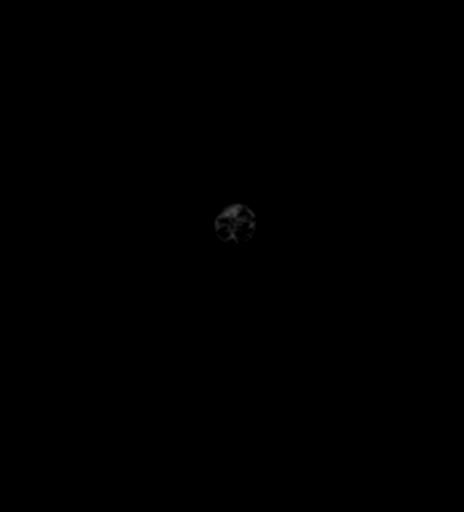

[Series 6: T1 fat-sat · axial · 3.0mm · 0.37mm/px · z∈[-135,+3]mm · 3 of 43 slices shown (2 of 2)]
[im 1/43]
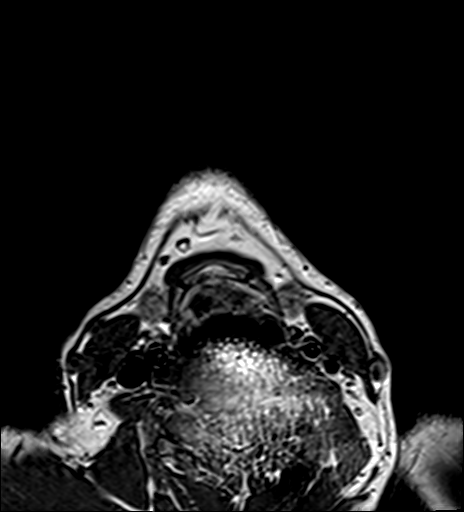
[im 22/43]
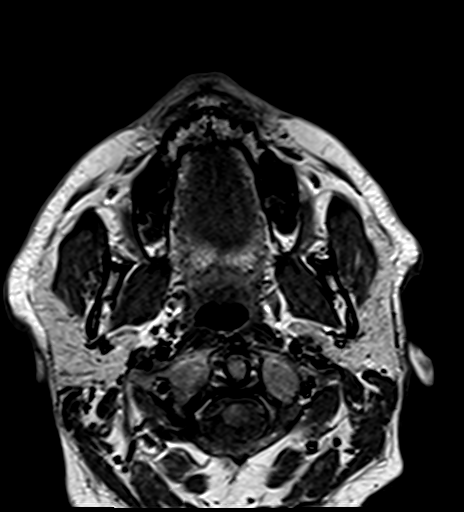
[im 43/43]
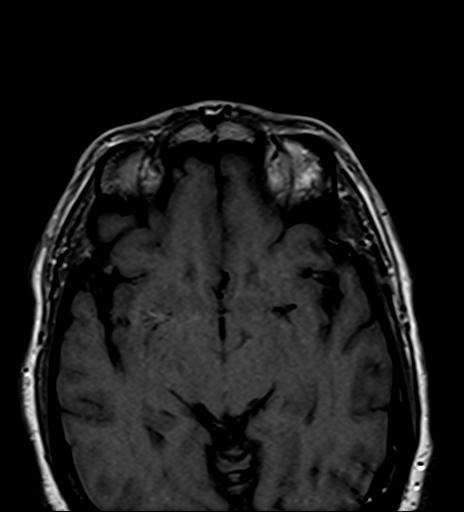

[Series 8: T1 fat-sat post-contrast · coronal · 3.0mm · 0.37mm/px · 3 of 48 slices shown]
[im 1/48]
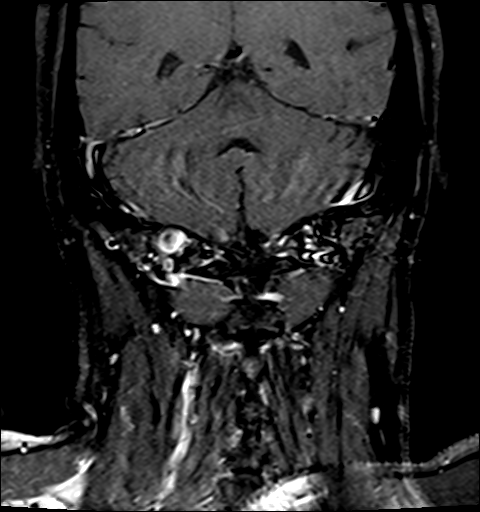
[im 24/48]
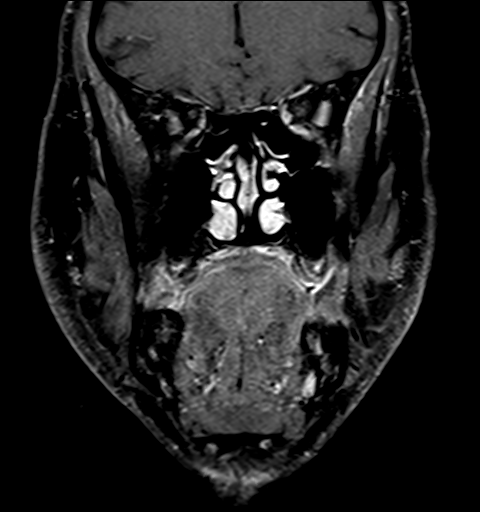
[im 48/48]
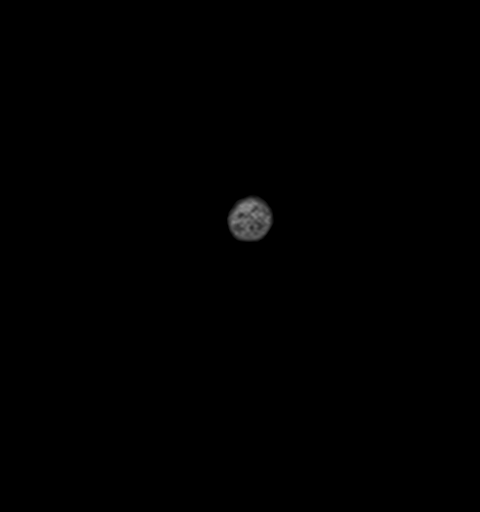

[16 of 48 positions shown; findings below may reference images not displayed]

FINDINGS: Limited intracranial/Trigeminal nerves: No signal abnormality in the
brainstem. Cisternal 5th nerve segments appear symmetric and normal.
No cisternal crossing vessel or mass effect identified.

Bilateral Meckel's cave appears symmetric and within normal limits.
Bilateral cavernous sinus appears symmetric and within normal
limits. Bilateral V3 trunks appear symmetric and normal as the X at
the skull base (series 8, image 16).

There is mildly asymmetric venous vasculature in the left pterygoid
space. But no asymmetric enlargement or enhancement of the left V3
or inferior alveolar nerves as they enter the mandible.

However, there is subtle asymmetric STIR hyperintensity and
enhancement along the course of the left inferior alveolar nerve in
the mandible (series 8, image 21, series 4 images eight and 9). And
nearby there is evidence of a small focus of asymmetric marrow edema
in the left posterior body the mandible alveolus (series 4, image
11) which enhances (series 8, image 25 and series 9, image 14). This
measures 9-10 mm and seems to be a tooth extraction site and
demonstrates mild associated decreased precontrast T1 marrow signal
series 2, image 25. No regional soft tissue inflammation or
lymphadenopathy identified, although nearby left level 1 B lymph
nodes are asymmetrically conspicuous. Left masticator space appears
normal.

But the other mandible dentition and marrow signal appears fairly
appears symmetric.

Symmetric, negative bilateral V2/inferior alveolar nerve course. And
no intraorbital mass or inflammation.

Vascular: Major intracranial vascular flow voids are preserved.

Sinuses/Orbits: Only trace bilateral paranasal sinus mucosal
thickening.

Soft tissues: No focal soft tissue inflammation in the deep soft
tissue spaces of the face. Small but asymmetric left level 1 B lymph
nodes. No upper cervical lymphadenopathy.

Osseous: Marrow edema appears within normal limits outside of the
left mandible described above.

Other: Dedicated brain MRI the same day reported separately.
IMPRESSION: 1. Asymmetric inflammatory signal and enhancement of the left
inferior alveolar nerve within the posterior body of the mandible,
with an associated marrow edema and enhancement at what appears to
be a left mandible posterior molar tooth extraction site.
Constellation suggests active Neuritis secondary to dental or
periodontal disease. And mild or early mandible osteomyelitis is
difficult to exclude.
Face CT (noncontrast should suffice) may provide valuable evaluation
of bone detail in that area.

2. However, no regional soft tissue inflammation. Although there are
small asymmetric nearby left level 1 lymph nodes.

3. Otherwise negative MRI appearance of the Face.

ADDENDUM:
Specific request was made for comment on the bilateral TMJ. This
exam design is not optimal for TMJ evaluation. On the single
sagittal T1 weighted sequence, both mandibular condyles are normally
located. There is no convincing loss of either TMJ articular disc
space. No mandible condyle marrow edema, and no convincing TMJ
region inflammation on axial STIR imaging.

*** End of Addendum ***
FINDINGS: Limited intracranial/Trigeminal nerves: No signal abnormality in the
brainstem. Cisternal 5th nerve segments appear symmetric and normal.
No cisternal crossing vessel or mass effect identified.

Bilateral Meckel's cave appears symmetric and within normal limits.
Bilateral cavernous sinus appears symmetric and within normal
limits. Bilateral V3 trunks appear symmetric and normal as the X at
the skull base (series 8, image 16).

There is mildly asymmetric venous vasculature in the left pterygoid
space. But no asymmetric enlargement or enhancement of the left V3
or inferior alveolar nerves as they enter the mandible.

However, there is subtle asymmetric STIR hyperintensity and
enhancement along the course of the left inferior alveolar nerve in
the mandible (series 8, image 21, series 4 images eight and 9). And
nearby there is evidence of a small focus of asymmetric marrow edema
in the left posterior body the mandible alveolus (series 4, image
11) which enhances (series 8, image 25 and series 9, image 14). This
measures 9-10 mm and seems to be a tooth extraction site and
demonstrates mild associated decreased precontrast T1 marrow signal
series 2, image 25. No regional soft tissue inflammation or
lymphadenopathy identified, although nearby left level 1 B lymph
nodes are asymmetrically conspicuous. Left masticator space appears
normal.

But the other mandible dentition and marrow signal appears fairly
appears symmetric.

Symmetric, negative bilateral V2/inferior alveolar nerve course. And
no intraorbital mass or inflammation.

Vascular: Major intracranial vascular flow voids are preserved.

Sinuses/Orbits: Only trace bilateral paranasal sinus mucosal
thickening.

Soft tissues: No focal soft tissue inflammation in the deep soft
tissue spaces of the face. Small but asymmetric left level 1 B lymph
nodes. No upper cervical lymphadenopathy.

Osseous: Marrow edema appears within normal limits outside of the
left mandible described above.

Other: Dedicated brain MRI the same day reported separately.
IMPRESSION: 1. Asymmetric inflammatory signal and enhancement of the left
inferior alveolar nerve within the posterior body of the mandible,
with an associated marrow edema and enhancement at what appears to
be a left mandible posterior molar tooth extraction site.
Constellation suggests active Neuritis secondary to dental or
periodontal disease. And mild or early mandible osteomyelitis is
difficult to exclude.
Face CT (noncontrast should suffice) may provide valuable evaluation
of bone detail in that area.

2. However, no regional soft tissue inflammation. Although there are
small asymmetric nearby left level 1 lymph nodes.

3. Otherwise negative MRI appearance of the Face.

## 2021-09-18 IMAGING — MR MR HEAD WO/W CM
12 series · 48 of 48 positions shown · IV contrast (multihance)
Comparison: Face MRI the same day is reported separately.

CLINICAL DATA: 50-year-old male with left side face and mandible
pain. Left side trigeminal neuralgia. Vertigo. Symptoms for 2 years
recently progresses. No known malignancy.

EXAM:
MRI HEAD WITHOUT AND WITH CONTRAST
TECHNIQUE: Multiplanar, multiecho pulse sequences of the brain and surrounding
structures were obtained without and with intravenous contrast.
CONTRAST:  20mL MULTIHANCE GADOBENATE DIMEGLUMINE 529 MG/ML IV SOLN

[Series 2: T1 · sagittal · 5.0mm · 0.45mm/px · 1 of 22 slices shown]
[im 1/22]
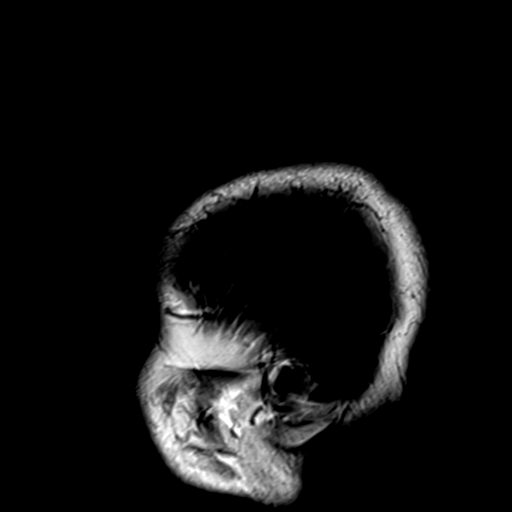

[Series 3: ax ep2d_diff_3 · axial · 3.0mm · 1.80mm/px · z∈[-66,+89]mm · 5 of 104 slices shown]
[im 1/104]
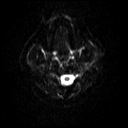
[im 26/104]
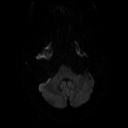
[im 52/104]
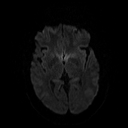
[im 78/104]
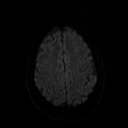
[im 104/104]
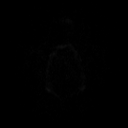

[Series 4: ax ep2d_diff_3_adc · axial · 3.0mm · 1.80mm/px · z∈[-66,+89]mm · 3 of 53 slices shown]
[im 1/53]
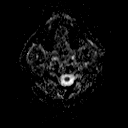
[im 27/53]
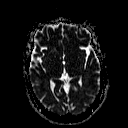
[im 53/53]
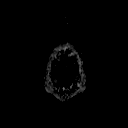

[Series 5: cor ep2d_diff · coronal · 5.0mm · 1.77mm/px · 4 of 58 slices shown]
[im 1/58]
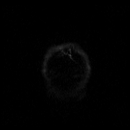
[im 20/58]
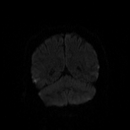
[im 39/58]
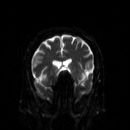
[im 58/58]
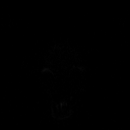

[Series 6: cor ep2d_diff_adc · coronal · 5.0mm · 1.77mm/px · 2 of 29 slices shown]
[im 1/29]
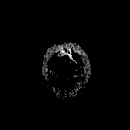
[im 29/29]
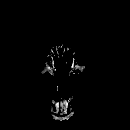

[Series 8: swi_images · axial · 2.0mm · 0.98mm/px · z∈[-57,+100]mm · 5 of 80 slices shown]
[im 1/80]
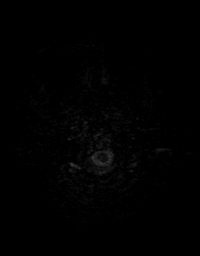
[im 20/80]
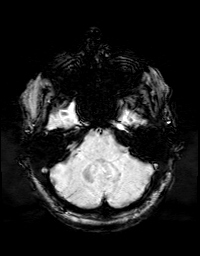
[im 40/80]
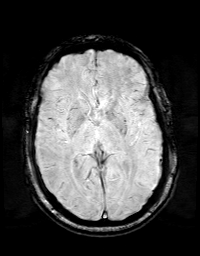
[im 60/80]
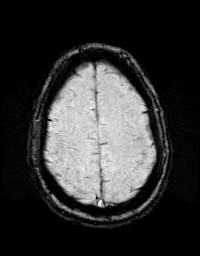
[im 80/80]
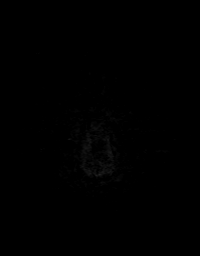

[Series 9: FLAIR · axial · 3.0mm · 0.43mm/px · z∈[-56,+95]mm · 2 of 40 slices shown]
[im 1/40]
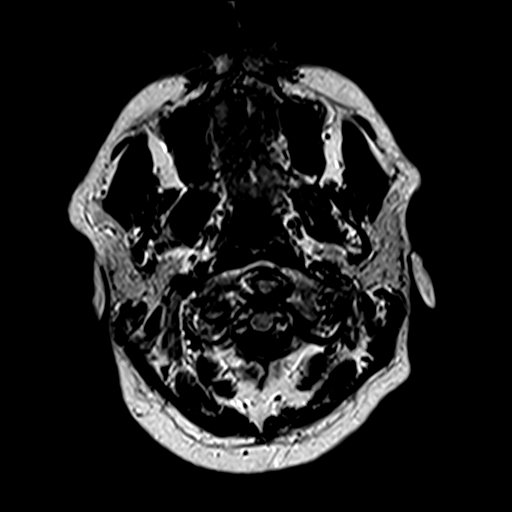
[im 40/40]
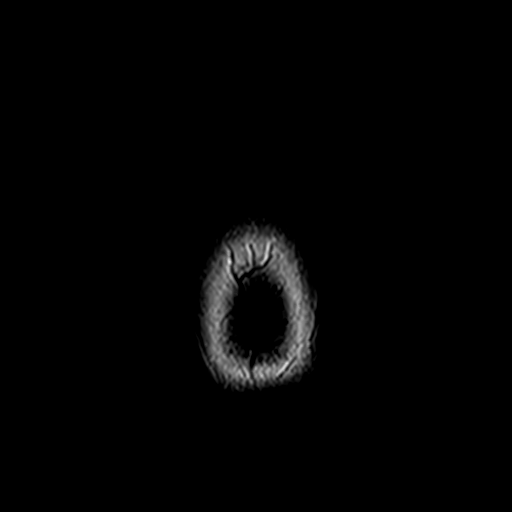

[Series 10: T2 · axial · 5.0mm · 0.65mm/px · z∈[-56,+99]mm · 2 of 27 slices shown (1 of 2)]
[im 1/27]
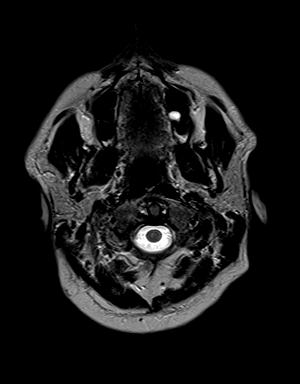
[im 27/27]
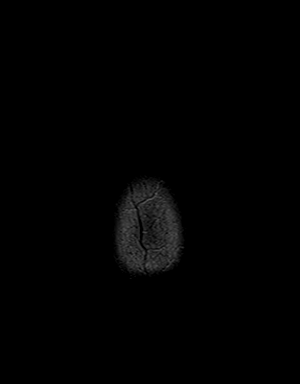

[Series 11: t1_mpr_tra · axial · 1.0mm · 0.72mm/px · z∈[-58,+100]mm · 10 of 160 slices shown]
[im 1/160]
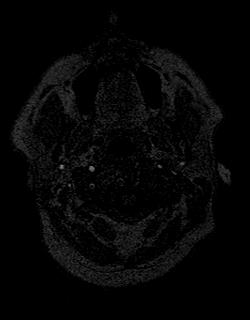
[im 18/160]
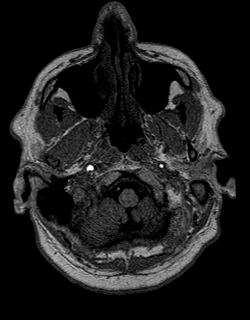
[im 36/160]
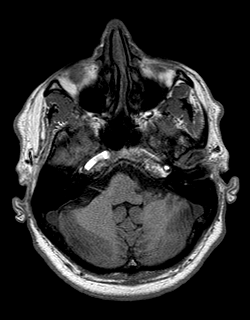
[im 54/160]
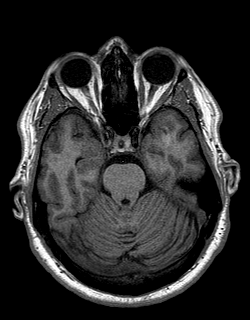
[im 71/160]
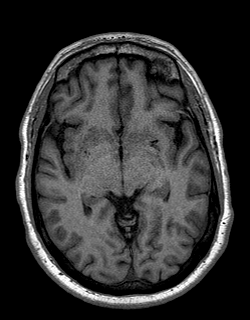
[im 89/160]
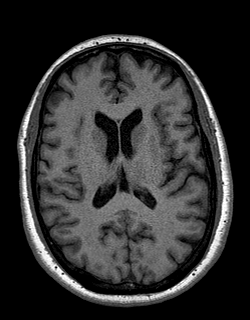
[im 107/160]
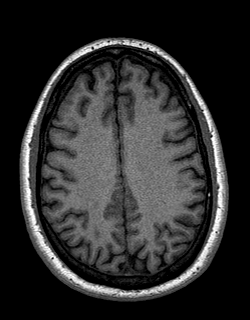
[im 124/160]
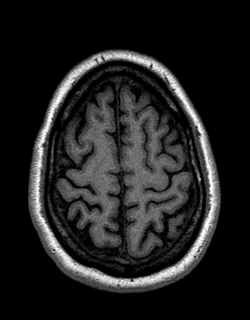
[im 142/160]
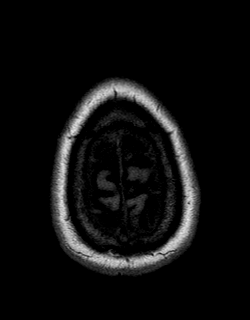
[im 160/160]
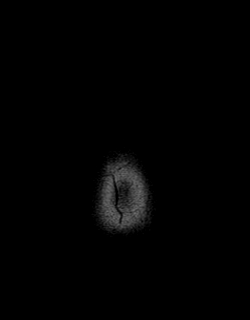

[Series 12: T2 · coronal · 5.0mm · 0.43mm/px · 2 of 29 slices shown (2 of 2)]
[im 1/29]
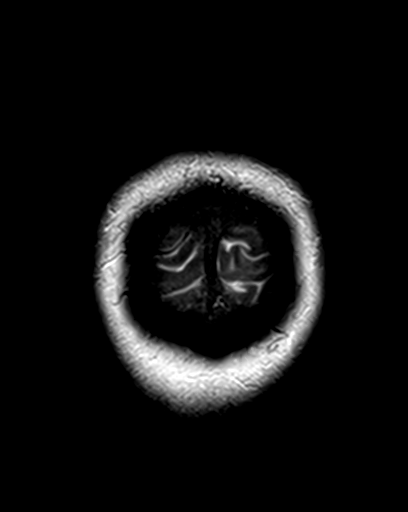
[im 29/29]
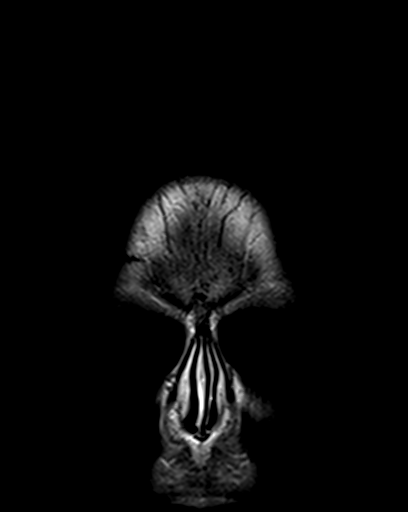

[Series 13: post t1_mpr_tra · axial · 1.0mm · 0.72mm/px · z∈[-58,+100]mm · 10 of 160 slices shown]
[im 1/160]
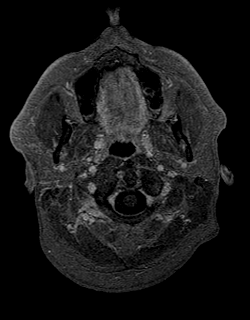
[im 18/160]
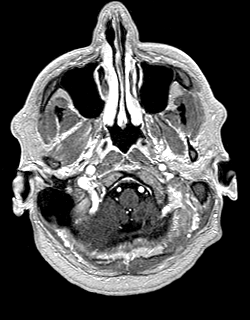
[im 36/160]
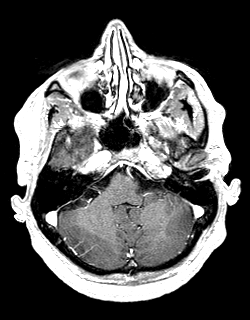
[im 54/160]
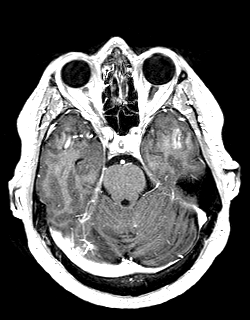
[im 71/160]
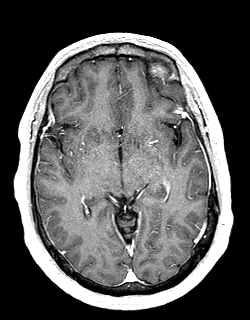
[im 89/160]
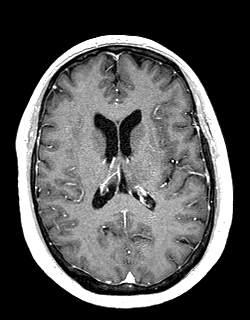
[im 107/160]
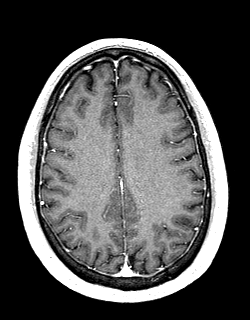
[im 124/160]
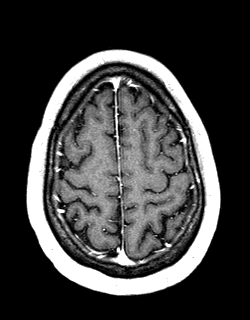
[im 142/160]
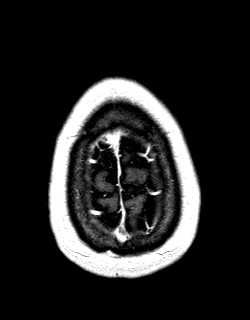
[im 160/160]
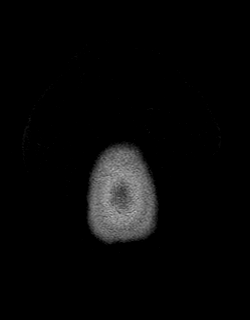

[Series 14: T1 post-contrast · coronal · 5.0mm · 0.72mm/px · 2 of 29 slices shown]
[im 1/29]
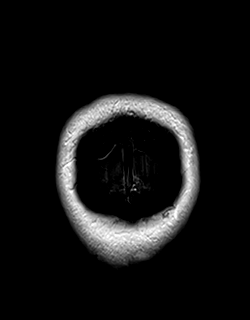
[im 29/29]
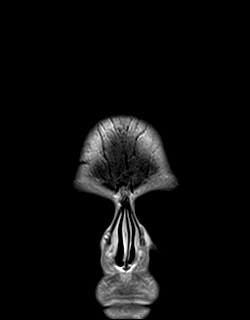

[48 of 48 positions shown; findings below may reference images not displayed]

FINDINGS: Brain: Normal cerebral volume. No restricted diffusion to suggest
acute infarction. No midline shift, mass effect, evidence of mass
lesion, ventriculomegaly, extra-axial collection or acute
intracranial hemorrhage. Cervicomedullary junction and pituitary are
within normal limits.

Mild for age scattered small mostly subcortical white matter foci of
T2 and FLAIR hyperintensity in both hemispheres. No cortical
encephalomalacia or chronic cerebral blood products identified. The
deep gray matter nuclei, brainstem and cerebellum appear normal.

No abnormal enhancement identified. No dural thickening.

Vascular: Major intracranial vascular flow voids are preserved. The
major dural venous sinuses are enhancing and appear to be patent.

Skull and upper cervical spine: Negative. Visualized bone marrow
signal is within normal limits.

Sinuses/Orbits: Visualized orbit soft tissues are within normal
limits. Only trace paranasal sinus mucosal thickening in the
maxillary sinuses.

Other: Face MRI reported separately. Visible internal auditory
structures appear normal.
IMPRESSION: 1. No acute intracranial abnormality.  Face MRI reported separately.
2. Mild for age nonspecific cerebral white matter signal changes.

## 2021-09-18 MED ORDER — GADOBENATE DIMEGLUMINE 529 MG/ML IV SOLN
20.0000 mL | Freq: Once | INTRAVENOUS | Status: AC | PRN
  Administered 2021-09-18: 20 mL via INTRAVENOUS

## 2021-09-20 ENCOUNTER — Encounter: Payer: Self-pay | Admitting: Neurology

## 2021-09-20 ENCOUNTER — Other Ambulatory Visit: Payer: Self-pay

## 2021-09-20 DIAGNOSIS — G500 Trigeminal neuralgia: Secondary | ICD-10-CM

## 2021-09-20 DIAGNOSIS — R6884 Jaw pain: Secondary | ICD-10-CM

## 2021-09-20 NOTE — Progress Notes (Signed)
Per DR.Tomi Likens, Then we can refer to another practice  [facility] [phone]  [address] [place], [place] [address]

## 2021-09-26 ENCOUNTER — Other Ambulatory Visit (HOSPITAL_COMMUNITY): Payer: Self-pay

## 2021-09-26 MED FILL — Meclizine HCl Tab 12.5 MG: ORAL | 10 days supply | Qty: 20 | Fill #0 | Status: AC

## 2021-09-28 ENCOUNTER — Other Ambulatory Visit: Payer: Self-pay | Admitting: Physician Assistant

## 2021-10-26 NOTE — Progress Notes (Signed)
NEUROLOGY FOLLOW UP OFFICE NOTE  Chris Robertson [id number]  Assessment/Plan:   Vertigo/vestibulopathy - BPPV ruled out by ENT.  Unclear if related to TMJ dysfunction. Left inferior alveolar neuritis secondary to dental or periodontal disease Dental/periodontal disease  1 I have asked that he have the periodontal records sent to me for review.  In absence of any significant pain, I don't think medications such as gabapentin are indicated.  He declines vestibular rehab due to financial restraints.    Subjective:  Chris Robertson is a 50 year old male with Chris Robertson disease, heart murmur, sensorineural hearing loss since birth secondary to rubella, and panic attacks who follows up for jaw pain and vertigo.  UPDATE: MRI brain and trigeminal nerve with and without contrast on 09/18/2021 personally reviewed showed "asymmetric inflammatory signal and enhancement of the left inferior alveolar nerve within the posterior body of the mandible with an associated marrow edema and enhancement at what appears to be a left mandible posterior molar tooth extraction site.  Constellation suggests active neuritis secondary to dental or periodontal disease.  And mild or early mandbile osteomyelitis is difficult to exclude."  He followed up with the periodontist who performed more imaging and told him they think the pain is related to the TMJ or inner ear.  They want to refer him to a TMJ specialist at [facility] but he cannot tolerate driving a car that far.  The jaw pain comes and goes but at this point, it is the vertigo that is most troublesome.  HISTORY:  He reports first episode of vertigo occurred in 2014.  At that time, he had tooth pain on the right.  He had a root canal and vertigo resolved.   Around August 2021, he developed recurrence of vertigo and left sided lower facial pain.  He noted pain and discomfort of the left first molar, second molar and second bicuspid.  Aggravating those teeth but poking  or biting down would cause shooting pain radiating up the left side of his lower jaw to behind the left ear and down the left side of his neck.  He initially had some tingling around those teeth as well.  He also developed vertigo which would occur with sudden head movements or if the pain was aggravated.  He went to several dentists and oral surgeons.  He states that visual inspection and imaging revealed no abnormalities of those teeth.  However, he had an oral surgeon extract the second molar in October 2021 and noted reduction in intensity of both the vertigo and facial pain.  Gradually, the pain at the location of his second molar resolved.  He wanted the first molar and second bicuspid extracted as well, but nobody would Chris Robertson it.  Finally, in early-mid 2022, he had a filling in his second bicuspid and noted further reduction in intensity of the vertigo.  He reports occasional pain flare-up involving the first molar and second bicuspid, particularly when touching or prodding the teeth.  He takes meclizine twice a day which provides mild relief in dizziness.  He has seen ENT who has not identified a clear diagnosis.  PAST MEDICAL HISTORY: Past Medical History:  Diagnosis Date   Anxiety    Chicken pox    Depression    DOE (dyspnea on exertion) 10/11/2018   Chris Robertson disease    Hearing impaired    age 71   Heart murmur    since childhood   Kidney stone    Mild mitral valve prolapse  2013   Mild MVP with mild MR by echo in 2013, read as trivial in 2019.   Near syncope 10/11/2018   Panic disorder 2015   With agoraphobia   Rapid heart beat 10/11/2018   Spermatocele 08/2012   hyrocele bilateral and spermatocele left; PSA normal 2013-2016   Thrombocytopenia (HCC)    mild, chronic   Vitamin D deficiency     MEDICATIONS: Current Outpatient Medications on File Prior to Visit  Medication Sig Dispense Refill   Cholecalciferol (VITAMIN D) 2000 units CAPS      meclizine (ANTIVERT) 12.5 MG tablet TAKE  1 TABLET BY MOUTH TWICE A DAY 20 tablet 1   Multiple Vitamin (MULTIVITAMIN) tablet Take 1 tablet by mouth daily.     No current facility-administered medications on file prior to visit.    ALLERGIES: No Known Allergies  FAMILY HISTORY: Family History  Problem Relation Age of Onset   Mental illness Mother    Huntington's disease Mother    Depression Mother    Mental illness Father    Hypertension Father    Diabetes Father    Hyperlipidemia Father    Depression Father    Obesity Father    Mental illness Sister    Depression Sister    Mental illness Brother    Thyroid cancer Maternal Aunt    Mental illness Maternal Aunt    Mental illness Maternal Uncle    Mental illness Paternal Aunt    Mental illness Paternal Uncle    Mental illness Maternal Grandmother    Pancreatic cancer Maternal Grandfather    Mental illness Maternal Grandfather    Mental illness Paternal Grandmother    Mental illness Paternal Grandfather       Objective:  Blood pressure (!) 144/89, pulse 74, height 6' 2"  (1.88 m), weight 223 lb 6.4 oz (101.3 kg), SpO2 100 %. General: No acute distress.  Patient appears well-groomed.      Chris Clines, Chris Robertson  Chris Robertson: Chris Coke, PA

## 2021-10-27 ENCOUNTER — Encounter: Payer: Self-pay | Admitting: Neurology

## 2021-10-27 ENCOUNTER — Other Ambulatory Visit: Payer: Self-pay

## 2021-10-27 ENCOUNTER — Ambulatory Visit: Payer: 59 | Admitting: Neurology

## 2021-10-27 ENCOUNTER — Telehealth: Payer: Self-pay | Admitting: Physician Assistant

## 2021-10-27 ENCOUNTER — Other Ambulatory Visit (HOSPITAL_COMMUNITY): Payer: Self-pay

## 2021-10-27 VITALS — BP 144/89 | HR 74 | Ht 74.0 in | Wt 223.4 lb

## 2021-10-27 DIAGNOSIS — H8192 Unspecified disorder of vestibular function, left ear: Secondary | ICD-10-CM | POA: Diagnosis not present

## 2021-10-27 DIAGNOSIS — M26609 Unspecified temporomandibular joint disorder, unspecified side: Secondary | ICD-10-CM

## 2021-10-27 DIAGNOSIS — R6884 Jaw pain: Secondary | ICD-10-CM | POA: Diagnosis not present

## 2021-10-28 ENCOUNTER — Other Ambulatory Visit (HOSPITAL_COMMUNITY): Payer: Self-pay

## 2021-10-28 ENCOUNTER — Other Ambulatory Visit: Payer: Self-pay | Admitting: Physician Assistant

## 2021-10-28 NOTE — Telephone Encounter (Signed)
Patient states he is currently out of the medication. Would like to be filled today if possible.

## 2021-10-28 NOTE — Telephone Encounter (Signed)
Please follow up with patient in regard.

## 2021-11-01 ENCOUNTER — Encounter: Payer: Self-pay | Admitting: Neurology

## 2021-11-01 ENCOUNTER — Other Ambulatory Visit: Payer: Self-pay | Admitting: *Deleted

## 2021-11-01 ENCOUNTER — Other Ambulatory Visit (HOSPITAL_COMMUNITY): Payer: Self-pay

## 2021-11-01 MED ORDER — MECLIZINE HCL 25 MG PO TABS
12.5000 mg | ORAL_TABLET | Freq: Two times a day (BID) | ORAL | 1 refills | Status: DC
  Filled 2021-11-01: qty 10, 10d supply, fill #0
  Filled 2021-12-13: qty 10, 10d supply, fill #1

## 2021-11-01 NOTE — Telephone Encounter (Signed)
RX sent

## 2021-11-04 NOTE — Progress Notes (Signed)
Chris Robertson is a 51 y.o. male here for vertigo.  History of Present Illness:   Chief Complaint  Patient presents with   Vertgo    Pt is c/o vertigo since Oct 2021 off and on. MRI done 09/18/2021.     HPI  Vertigo He is having episode of vertigo associated with his tooth pain. He has seen the dentist but they don't want to perform surgery on the tooth, as they see nothing wrong with it. He has had symptoms of vertigo for 3 years. He has seen multiple dentists and he went to the ENT and he had a CT scan and no major findings were found.   He had MRI with and without contrast on 09/18/21 with Dr. Tomi Likens which showed  "asymmetric inflammatory signal and enhancement of the left inferior alveolar nerve within the posterior body of the mandible with an associated marrow edema and enhancement at what appears to be a left mandible posterior molar tooth extraction site.  Constellation suggests active neuritis secondary to dental or periodontal disease.  And mild or early mandbile osteomyelitis is difficult to exclude."    He saw periodontist at [facility] that thinks his symptoms are related to TMJ or inner ear, as they didn't feel like he had any significant dental issue.  Having significant fatigue due to dealing with these symptoms. It is disrupting his quality of life. Meclizine is somewhat helpful however, he has been getting different manufacturers of meclizine and only some of these are effective for him and other cause adverse effects such as diarrhea. He is unable to consistently exercise due to this. He has had weight gain due to this.   He has been asked to travel to [facility] to evaluate for TMJ however he is unable to drive that far. His mother lives in [place], he states that he has to take a whole tablet of meclizine to manage the trip there.  Has ringing in his R ear also.   Past Medical History:  Diagnosis Date   Anxiety    Chicken pox    Depression    DOE (dyspnea on  exertion) 10/11/2018   Rosanna Randy disease    Hearing impaired    age 10   Heart murmur    since childhood   Kidney stone    Mild mitral valve prolapse 2013   Mild MVP with mild MR by echo in 2013, read as trivial in 2019.   Near syncope 10/11/2018   Panic disorder 2015   With agoraphobia   Rapid heart beat 10/11/2018   Spermatocele 08/2012   hyrocele bilateral and spermatocele left; PSA normal 2013-2016   Thrombocytopenia (HCC)    mild, chronic   Vitamin D deficiency      Social History   Tobacco Use   Smoking status: Never   Smokeless tobacco: Never  Vaping Use   Vaping Use: Never used  Substance Use Topics   Alcohol use: Not Currently    Comment: Sober since September 2019   Drug use: No    Past Surgical History:  Procedure Laterality Date   CHOLECYSTECTOMY  2007   [place]   TOOTH EXTRACTION     TRANSTHORACIC ECHOCARDIOGRAM  10/03/2018   Normal LV size and function.  EF 55 to 60%.  No regional wall motion normality.  Trivial late mitral prolapse with trivial regurgitation.  Also trivial AI, TR, PR.    Family History  Problem Relation Age  of Onset   Mental illness Mother    Huntington's disease Mother    Depression Mother    Mental illness Father    Hypertension Father    Diabetes Father    Hyperlipidemia Father    Depression Father    Obesity Father    Mental illness Sister    Depression Sister    Mental illness Brother    Thyroid cancer Maternal Aunt    Mental illness Maternal Aunt    Mental illness Maternal Uncle    Mental illness Paternal Aunt    Mental illness Paternal Uncle    Mental illness Maternal Grandmother    Pancreatic cancer Maternal Grandfather    Mental illness Maternal Grandfather    Mental illness Paternal Grandmother    Mental illness Paternal Grandfather     No Known Allergies  Current Medications:   Current Outpatient Medications:    Cholecalciferol (VITAMIN D) 2000 units CAPS, ,  Disp: , Rfl:    meclizine (ANTIVERT) 25 MG tablet, Take 1/2 tablets (12.5 mg total) by mouth 2 (two) times daily., Disp: 10 tablet, Rfl: 1   Multiple Vitamin (MULTIVITAMIN) tablet, Take 1 tablet by mouth daily., Disp: , Rfl:    Review of Systems:   ROS Negative unless otherwise specified per HPI. Vitals:   Vitals:   11/07/21 0831  BP: 118/70  Pulse: 82  Temp: 97.9 F (36.6 C)  TempSrc: Temporal  SpO2: 97%  Weight: 222 lb (100.7 kg)  Height: 6' 2"  (1.88 m)     Body mass index is 28.5 kg/m.  Physical Exam:   Physical Exam Vitals and nursing note reviewed.  Constitutional:      General: He is not in acute distress.    Appearance: He is well-developed. He is not ill-appearing or toxic-appearing.  Cardiovascular:     Rate and Rhythm: Normal rate and regular rhythm.     Pulses: Normal pulses.     Heart sounds: Normal heart sounds, S1 normal and S2 normal.  Pulmonary:     Effort: Pulmonary effort is normal.     Breath sounds: Normal breath sounds.  Skin:    General: Skin is warm and dry.  Neurological:     Mental Status: He is alert.     GCS: GCS eye subscore is 4. GCS verbal subscore is 5. GCS motor subscore is 6.  Psychiatric:        Speech: Speech normal.        Behavior: Behavior normal. Behavior is cooperative.    Assessment and Plan:   Vertigo; Jaw Pain Ongoing, remains uncontrolled Provided emotional support I will put in referral for [facility] for further evaluation -- we looked at this website together and he thinks it may be a good next step I have also asked him to send me a mychart message with the exact manufacturer for the meclizine that works best for him so I can send this in for you to have -- understanding that the pharmacy may have to special order this Also, consider scopolamine patches and/or vestibular rehab Follow-up with [place]  I,Havlyn C Ratchford,acting as a scribe for [employer], PA.,have documented all relevant documentation on the  behalf of Inda Coke, PA,as directed by  Inda Coke, PA while in the presence of Inda Coke, [place].  I, Inda Coke, [place], have reviewed all documentation for this visit. The documentation on 11/07/21 for the exam, diagnosis, procedures, and orders are all accurate and complete.  Time spent with patient today  was 25 minutes which consisted of chart review of imaging and multiple providers documentation, discussing diagnosis, work up, treatment answering questions and documentation.   Inda Coke, PA-C

## 2021-11-07 ENCOUNTER — Ambulatory Visit: Payer: 59 | Admitting: Physician Assistant

## 2021-11-07 ENCOUNTER — Other Ambulatory Visit: Payer: Self-pay

## 2021-11-07 ENCOUNTER — Encounter: Payer: Self-pay | Admitting: Physician Assistant

## 2021-11-07 VITALS — BP 118/70 | HR 82 | Temp 97.9°F | Ht 74.0 in | Wt 222.0 lb

## 2021-11-07 DIAGNOSIS — R6884 Jaw pain: Secondary | ICD-10-CM | POA: Diagnosis not present

## 2021-11-07 DIAGNOSIS — R42 Dizziness and giddiness: Secondary | ICD-10-CM

## 2021-11-07 DIAGNOSIS — Z23 Encounter for immunization: Secondary | ICD-10-CM

## 2021-11-07 NOTE — Patient Instructions (Signed)
It was great to see you!  I will put in referral for [facility] --> [email or web address] --> I will put referral in today, you call them tomorrow  Please send me a message with the exact manufacturer for the meclizine so I can send this in for you to have -- pharmacy may have to special order for this  Consider scopolamine patches  Keep me posted  Take care,  Inda Coke PA-C

## 2021-12-13 ENCOUNTER — Other Ambulatory Visit (HOSPITAL_COMMUNITY): Payer: Self-pay

## 2021-12-16 ENCOUNTER — Ambulatory Visit
Admission: EM | Admit: 2021-12-16 | Discharge: 2021-12-16 | Disposition: A | Discharge status: Home / Self Care | Payer: 59 | Attending: Emergency Medicine | Admitting: Emergency Medicine

## 2021-12-16 ENCOUNTER — Other Ambulatory Visit (HOSPITAL_COMMUNITY): Payer: Self-pay

## 2021-12-16 DIAGNOSIS — M792 Neuralgia and neuritis, unspecified: Secondary | ICD-10-CM

## 2021-12-16 MED ORDER — GABAPENTIN 300 MG PO CAPS
300.0000 mg | ORAL_CAPSULE | Freq: Three times a day (TID) | ORAL | 0 refills | Status: DC
  Filled 2021-12-16: qty 90, 30d supply, fill #0

## 2021-12-16 NOTE — ED Provider Notes (Signed)
[facility]    CSN: [id number] Arrival date & time: 12/16/21  4010    HISTORY   Chief Complaint  Patient presents with   Dizziness   HPI Chris Robertson is a 51 y.o. male. Patient presents to [facility] for evaluation of continues tinnitus and vertigo.  He has been struggling with it for almost a year, is seeing multiple specialists, but his meclizine has stopped working in the past few days, and the dizziness, sensitivity to sound, and tinnitus have been worsening, impacting his ability to deal with daily activities.  Patient states he has not tried any other medications for his symptoms as of yet.  Supposed to see a TMJ specialist next week.  Has seen neurology, oral surgeons, [facility] team, and ENT without relief.  Results of trigeminal MRI reviewed with patient, copy provided for his records.  The history is provided by the patient.  Past Medical History:  Diagnosis Date   Anxiety    Chicken pox    Depression    DOE (dyspnea on exertion) 10/11/2018   Rosanna Randy disease    Hearing impaired    age 60   Heart murmur    since childhood   Kidney stone    Mild mitral valve prolapse 2013   Mild MVP with mild MR by echo in 2013, read as trivial in 2019.   Near syncope 10/11/2018   Panic disorder 2015   With agoraphobia   Rapid heart beat 10/11/2018   Spermatocele 08/2012   hyrocele bilateral and spermatocele left; PSA normal 2013-2016   Thrombocytopenia (HCC)    mild, chronic   Vitamin D deficiency    Patient Active Problem List   Diagnosis Date Noted   Vertigo, intermittent 09/12/2020   Lactic acidemia 09/12/2020   Elevated ferritin 05/18/2020   Family history of hemochromatosis 05/18/2020   Family history of Huntington's disease 05/18/2020   Seasonal allergic rhinitis due to pollen 02/27/2019   Depression with anxiety 02/27/2019   Congenital hearing loss of both ears 05/23/2016   Hydrocele in adult 05/12/2016   Past Surgical History:  Procedure Laterality  Date   CHOLECYSTECTOMY  2007   [place] ECHOCARDIOGRAM  10/03/2018   Normal LV size and function.  EF 55 to 60%.  No regional wall motion normality.  Trivial late mitral prolapse with trivial regurgitation.  Also trivial AI, TR, PR.    Home Medications    Prior to Admission medications   Medication Sig Start Date End Date Taking? Authorizing Provider  Cholecalciferol (VITAMIN D) 2000 units CAPS     [provider]  meclizine (ANTIVERT) 25 MG tablet Take 1/2 tablet (12.5 mg total) by mouth 2 (two) times daily. 11/01/21   Inda Coke, PA  Multiple Vitamin (MULTIVITAMIN) tablet Take 1 tablet by mouth daily.    [provider]    Family History Family History  Problem Relation Age of Onset   Mental illness Mother    Huntington's disease Mother    Depression Mother    Mental illness Father    Hypertension Father    Diabetes Father    Hyperlipidemia Father    Depression Father    Obesity Father    Mental illness Sister    Depression Sister    Mental illness Brother    Thyroid cancer Maternal Aunt    Mental illness Maternal Aunt    Mental illness Maternal  Uncle    Mental illness Paternal Aunt    Mental illness Paternal Uncle    Mental illness Maternal Grandmother    Pancreatic cancer Maternal Grandfather    Mental illness Maternal Grandfather    Mental illness Paternal Grandmother    Mental illness Paternal Grandfather    Social History Social History   Tobacco Use   Smoking status: Never   Smokeless tobacco: Never  Vaping Use   Vaping Use: Never used  Substance Use Topics   Alcohol use: Not Currently    Comment: Sober since September 2019   Drug use: No   Allergies   Patient has no known allergies.  Review of Systems Review of Systems Pertinent findings noted in history of present illness.   Physical Exam Triage Vital Signs ED Triage Vitals  Enc Vitals  Group     BP 08/26/21 0827 (!) 147/82     Pulse Rate 08/26/21 0827 72     Resp 08/26/21 0827 18     Temp 08/26/21 0827 98.3 F (36.8 C)     Temp Source 08/26/21 0827 Oral     SpO2 08/26/21 0827 98 %     Weight --      Height --      Head Circumference --      Peak Flow --      Pain Score 08/26/21 0826 5     Pain Loc --      Pain Edu? --      Excl. in [place]? --   No data found.  Updated Vital Signs BP 125/87 (BP Location: Left Arm)    Pulse 94    Temp 98.9 F (37.2 C) (Oral)    Resp 18    SpO2 96%   Physical Exam Vitals and nursing note reviewed.  Constitutional:      General: He is not in acute distress.    Appearance: Normal appearance. He is not ill-appearing.  HENT:     Head: Normocephalic and atraumatic.     Salivary Glands: Right salivary gland is not diffusely enlarged or tender. Left salivary gland is not diffusely enlarged or tender.     Right Ear: External ear normal.     Left Ear: External ear normal.     Ears:     Comments: Patient politely declined ear exam today, unwilling to remove hearing aids.      Nose: Nose normal. No nasal deformity, septal deviation, mucosal edema, congestion or rhinorrhea.     Right Turbinates: Not enlarged, swollen or pale.     Left Turbinates: Not enlarged, swollen or pale.     Right Sinus: No maxillary sinus tenderness or frontal sinus tenderness.     Left Sinus: No maxillary sinus tenderness or frontal sinus tenderness.     Mouth/Throat:     Lips: Pink. No lesions.     Mouth: Mucous membranes are moist. No oral lesions.     Pharynx: Oropharynx is clear. Uvula midline. No posterior oropharyngeal erythema or uvula swelling.     Tonsils: No tonsillar exudate. 0 on the right. 0 on the left.  Eyes:     General: Lids are normal.        Right eye: No discharge.        Left eye: No discharge.     Extraocular Movements: Extraocular movements intact.     Conjunctiva/sclera: Conjunctivae normal.     Right eye: Right conjunctiva is not  injected.     Left eye: Left conjunctiva  is not injected.  Neck:     Trachea: Trachea and phonation normal.  Cardiovascular:     Rate and Rhythm: Normal rate and regular rhythm.     Pulses: Normal pulses.     Heart sounds: Normal heart sounds. No murmur heard.   No friction rub. No gallop.  Pulmonary:     Effort: Pulmonary effort is normal. No accessory muscle usage, prolonged expiration or respiratory distress.     Breath sounds: Normal breath sounds. No stridor, decreased air movement or transmitted upper airway sounds. No decreased breath sounds, wheezing, rhonchi or rales.  Chest:     Chest wall: No tenderness.  Musculoskeletal:        General: Normal range of motion.     Cervical back: Normal range of motion and neck supple. Normal range of motion.  Lymphadenopathy:     Cervical: No cervical adenopathy.  Skin:    General: Skin is warm and dry.     Findings: No erythema or rash.  Neurological:     General: No focal deficit present.     Mental Status: He is alert and oriented to person, place, and time.  Psychiatric:        Mood and Affect: Mood normal.        Behavior: Behavior normal.    Visual Acuity Right Eye Distance:   Left Eye Distance:   Bilateral Distance:    Right Eye Near:   Left Eye Near:    Bilateral Near:     UC Couse / Diagnostics / Procedures:    EKG  Radiology No results found.  Procedures Procedures (including critical care time)  UC Diagnoses / Final Clinical Impressions(s)   I have reviewed the triage vital signs and the nursing notes.  Pertinent labs & imaging results that were available during my care of the patient were reviewed by me and considered in my medical decision making (see chart for details).    Final diagnoses:  Neuritis   Given presence of neuritis seen on MRI, recommend patient trial gabapentin, initially at bedtime and then increase to 3 times daily as tolerated.  Also discussed NTI mouthguard for possible TMJ etiology,  patient states he will discuss this at his upcoming TMJ visit.  To continue meclizine.  ED Prescriptions     Medication Sig Dispense Auth. Provider   gabapentin (NEURONTIN) 300 MG capsule Take 1 capsule by mouth 3 (three) times daily. 90 capsule Lynden Oxford Scales, [place]      PDMP not reviewed this encounter.  Pending results:  Labs Reviewed - No data to display  Medications Ordered in UC: Medications - No data to display  Disposition Upon Discharge:  Condition: stable for discharge home Home: take medications as prescribed; routine discharge instructions as discussed; follow up as advised.  Patient presented with an acute illness with associated systemic symptoms and significant discomfort requiring urgent management. In my opinion, this is a condition that a prudent lay person (someone who possesses an average knowledge of health and medicine) may potentially expect to result in complications if not addressed urgently such as respiratory distress, impairment of bodily function or dysfunction of bodily organs.   Routine symptom specific, illness specific and/or disease specific instructions were discussed with the patient and/or caregiver at length.   As such, the patient has been evaluated and assessed, work-up was performed and treatment was provided in alignment with urgent care protocols and evidence based medicine.  Patient/parent/caregiver has been advised that the patient may require  follow up for further testing and treatment if the symptoms continue in spite of treatment, as clinically indicated and appropriate.  If the patient was tested for COVID-19, Influenza and/or RSV, then the patient/parent/guardian was advised to isolate at home pending the results of his/her diagnostic coronavirus test and potentially longer if theyre positive. I have also advised pt that if his/her COVID-19 test returns positive, it's recommended to self-isolate for at least 10 days after symptoms  first appeared AND until fever-free for 24 hours without fever reducer AND other symptoms have improved or resolved. Discussed self-isolation recommendations as well as instructions for household member/close contacts as per the [facility] and [place] DHHS, and also gave patient the [facility] packet with this information.  Patient/parent/caregiver has been advised to return to the [facility] or PCP in 3-5 days if no better; to PCP or the Emergency Department if new signs and symptoms develop, or if the current signs or symptoms continue to change or worsen for further workup, evaluation and treatment as clinically indicated and appropriate  The patient will follow up with their current PCP if and as advised. If the patient does not currently have a PCP we will assist them in obtaining one.   The patient may need specialty follow up if the symptoms continue, in spite of conservative treatment and management, for further workup, evaluation, consultation and treatment as clinically indicated and appropriate.   Patient/parent/caregiver verbalized understanding and agreement of plan as discussed.  All questions were addressed during visit.  Please see discharge instructions below for further details of plan.  Discharge Instructions: Discharge Instructions   None     This office note has been dictated using Dragon speech recognition software.  Unfortunately, and despite my best efforts, this method of dictation can sometimes lead to occasional typographical or grammatical errors.  I apologize in advance if this occurs.     Lynden Oxford Scales, PA-C 12/16/21 1437

## 2021-12-16 NOTE — ED Triage Notes (Signed)
Patient presents to [facility] for evaluation of continues tinnitus and vertigo.  He has been struggling with it for almost a year, is seeing multiple specialists, but his meclizine has stopped working in the past few days, and the dizziness, sensitivity to sound, and tinnitus have been worsening, impacting his ability to deal with daily activities.  Supposed to see a TMJ specialist next week.  Has seen neurology, oral surgeons, [facility] team, and ENT without relief.

## 2021-12-26 ENCOUNTER — Encounter: Payer: Self-pay | Admitting: Physician Assistant

## 2021-12-26 ENCOUNTER — Telehealth: Payer: 59 | Admitting: Physician Assistant

## 2021-12-26 VITALS — Ht 74.0 in | Wt 220.0 lb

## 2021-12-26 DIAGNOSIS — R42 Dizziness and giddiness: Secondary | ICD-10-CM | POA: Diagnosis not present

## 2021-12-26 DIAGNOSIS — R6884 Jaw pain: Secondary | ICD-10-CM | POA: Diagnosis not present

## 2021-12-26 NOTE — Progress Notes (Signed)
I acted as a Education administrator for [employer], PA-C Anselmo Pickler, LPN  Virtual Visit via Video Note   I, Inda Coke, connected with  Chris Robertson  ([id number], [birth year]) on 12/26/21 at  2:30 PM EST by a video-enabled telemedicine application and verified that I am speaking with the correct person using two identifiers.  Location: Patient: Home Provider: [place] office   I discussed the limitations of evaluation and management by telemedicine and the availability of in person appointments. The patient expressed understanding and agreed to proceed.    History of Present Illness: Chris Robertson is a 51 y.o. who identifies as a male who was assigned male at birth, and is being seen today for jaw pain. Pt wants to discuss information from visit with TMJ specialist last Wed 2/15.  While at the visit he was inverted and had some maneuvers performed - he felt that this greatly improved his vertigo sensation and has felt much better than he has in awhile. He is wondering if he should pursue an "inner ear work-up" to further evaluate this, since he had such good results.  He does have ongoing pain in TMJ region. His specialist is out of network and requesting that he have a TMJ MRI performed for further evaluation.   Denies new symptoms since last seeing me. He did visit urgent care on 12/16/21 for this and was given gabapentin however he felt like this exacerbated his symptoms.  Problems:  Patient Active Problem List   Diagnosis Date Noted   Vertigo, intermittent 09/12/2020   Lactic acidemia 09/12/2020   Elevated ferritin 05/18/2020   Family history of hemochromatosis 05/18/2020   Family history of Huntington's disease 05/18/2020   Seasonal allergic rhinitis due to pollen 02/27/2019   Depression with anxiety 02/27/2019   Congenital hearing loss of both ears 05/23/2016   Hydrocele in adult 05/12/2016    Allergies: No Known Allergies Medications:  Current Outpatient  Medications:    Cholecalciferol (VITAMIN D) 2000 units CAPS, , Disp: , Rfl:    meclizine (ANTIVERT) 25 MG tablet, Take 1/2 tablet (12.5 mg total) by mouth 2 (two) times daily., Disp: 10 tablet, Rfl: 1   Multiple Vitamin (MULTIVITAMIN) tablet, Take 1 tablet by mouth daily., Disp: , Rfl:   Observations/Objective: Patient is well-developed, well-nourished in no acute distress.  Resting comfortably  at home.  Head is normocephalic, atraumatic.  No labored breathing.  Speech is clear and coherent with logical content.  Patient is alert and oriented at baseline.   Assessment and Plan: Jaw pain and Vertigo, intermittent Ongoing; no red flags Discussed ordering MR TMJ -- I'm ok to order this but did encourage patient to pursue vestibular rehab PT first as this is going to be most cost effective I also informed patient that I am unable to interpret these specialty MRI's and would not be able to advise further should there be abnormalities -- he verbalized understanding and is in agreement. - MR TMJ; Future - Ambulatory referral to Physical Therapy  Follow Up Instructions: I discussed the assessment and treatment plan with the patient. The patient was provided an opportunity to ask questions and all were answered. The patient agreed with the plan and demonstrated an understanding of the instructions.  A copy of instructions were sent to the patient via MyChart unless otherwise noted below.   The patient was advised to call back or seek an in-person evaluation if the symptoms worsen or if the condition fails to improve as anticipated.  Time spent with patient today was 25 minutes which consisted of chart review, discussing diagnosis, work up, treatment answering questions and documentation.   Inda Coke, [place]

## 2022-01-05 ENCOUNTER — Ambulatory Visit: Payer: 59 | Attending: Physician Assistant | Admitting: Physical Therapy

## 2022-01-05 ENCOUNTER — Encounter: Payer: Self-pay | Admitting: Physical Therapy

## 2022-01-05 ENCOUNTER — Other Ambulatory Visit: Payer: Self-pay

## 2022-01-05 DIAGNOSIS — R42 Dizziness and giddiness: Secondary | ICD-10-CM | POA: Diagnosis not present

## 2022-01-05 NOTE — Therapy (Signed)
[facility] [address] [place], [place], [address] Phone: [phone]   Fax:  [phone]  Physical Therapy Evaluation  Patient Details  Name: Chris Robertson MRN: [id number] Date of Birth: 20-Jun-1971 Referring Provider (PT): Inda Coke, [place]   Encounter Date: 01/05/2022   PT End of Session - 01/05/22 1708     Visit Number 1    Number of Visits 13    Date for PT Re-Evaluation 02/16/22    Authorization Type [facility]    Authorization - Visit Number 1    Authorization - Number of Visits 25    PT Start Time 2725    PT Stop Time 1608    PT Time Calculation (min) 43 min    Activity Tolerance Patient tolerated treatment well    Behavior During Therapy [facility] for tasks assessed/performed             Past Medical History:  Diagnosis Date   Anxiety    Chicken pox    Depression    DOE (dyspnea on exertion) 10/11/2018   Rosanna Randy disease    Hearing impaired    age 74   Heart murmur    since childhood   Kidney stone    Mild mitral valve prolapse 2013   Mild MVP with mild MR by echo in 2013, read as trivial in 2019.   Near syncope 10/11/2018   Panic disorder 2015   With agoraphobia   Rapid heart beat 10/11/2018   Spermatocele 08/2012   hyrocele bilateral and spermatocele left; PSA normal 2013-2016   Thrombocytopenia (HCC)    mild, chronic   Vitamin D deficiency     Past Surgical History:  Procedure Laterality Date   CHOLECYSTECTOMY  2007   [place]   TOOTH EXTRACTION     TRANSTHORACIC ECHOCARDIOGRAM  10/03/2018   Normal LV size and function.  EF 55 to 60%.  No regional wall motion normality.  Trivial late mitral prolapse with trivial regurgitation.  Also trivial AI, TR, PR.    There were no vitals filed for this visit.    Subjective Assessment - 01/05/22 1527     Subjective Patient reports vertigo and R ear tinnitus for the past 2 years after undergoing a couple root  canals. Believes that it is related to a bad tooth on the L side; has gotten workup by several dentists and specialists but has been told that it is fine. Seeing a TMJ specialist now who is currently doing further work up. Dizziness is described as spinning. Episodes last seconds-hours and even lasting all day. Worse with sudden turns, head pressure with rolling, getting out of bed.  Has had nausea and vomiting from these episodes. Congenital B hearing loss. Denies head trauma, infection/illness, vision changes/double vision, hearing loss, otalgia, photophobia. Reports occasional migraines but has not been diagnosed as such. Notes some neck stiffness. Reports some phonophobia in the R ear.    Pertinent History anxiety, depression, impaired hearing, panic disoder, shoulder surgery    Limitations Sitting;Reading;Lifting;Standing;Walking;House hold activities;Writing    Diagnostic tests 09/18/21 brain MRI:  No acute intracranial abnormality.  Face MRI reported separately. Mild for age nonspecific cerebral white matter signal changes. 09/18/21 face MRI: Constellation suggests active Neuritis secondary to dental or periodontal disease. And mild or early mandible osteomyelitis is  difficult to exclude.    Patient Stated Goals improve dizziness    Currently in Pain? Yes  Pain Score 5     Pain Location Jaw    Pain Orientation Left    Pain Descriptors / Indicators Constant    Pain Type Chronic pain                [facility] PT Assessment - 01/05/22 1537       Assessment   Medical Diagnosis Vertigo, intermittent    Referring Provider (PT) Inda Coke, [place]    Onset Date/Surgical Date 01/06/20    Next MD Visit not scheduled    Prior Therapy no      Precautions   Precautions None      Balance Screen   Has the patient fallen in the past 6 months No    Has the patient had a decrease in activity level because of a fear of falling?  No    Is the patient reluctant to leave their home because of a  fear of falling?  No      Home Environment   Living Environment Private residence    Living Arrangements Alone    Type of [place]      Prior Function   Level of [place] Full time employment    Vocation Requirements [employer]- driving, lifting equipment, crawling under desks, computer work    [employer] scientific officer   Overall Cognitive Status Within Functional Limits for tasks assessed      Sensation   Light Touch Appears Intact                    Vestibular Assessment - 01/05/22 1540       Oculomotor Exam   Oculomotor Alignment Abnormal   L eye elevated   Ocular ROM WNL    Spontaneous Absent    Gaze-induced  Absent    Smooth Pursuits Intact    Saccades --      Oculomotor Exam-Fixation Suppressed    Left Head Impulse negative   negative   Right Head Impulse negative   dizziness     Vestibulo-Ocular Reflex   VOR 1 Head Only (x 1 viewing) c/o dizziness with horizontal and vertical VOR, required to stop    VOR Cancellation Unable to maintain gaze   c/o dizziness     Positional Testing   Dix-Hallpike Dix-Hallpike Right;Dix-Hallpike Left    Horizontal Canal Testing Horizontal Canal Right;Horizontal Canal Left      Dix-Hallpike Right   Dix-Hallpike Right Duration 30    Dix-Hallpike Right Symptoms Upbeat, right rotatory nystagmus      Dix-Hallpike Left   Dix-Hallpike Left Duration 0    Dix-Hallpike Left Symptoms No nystagmus      Horizontal Canal Right   Horizontal Canal Right Duration 0    Horizontal Canal Right Symptoms Normal      Horizontal Canal Left   Horizontal Canal Left Duration 0    Horizontal Canal Left Symptoms Normal                Objective measurements completed on examination: See above findings.        Vestibular Treatment/Exercise - 01/05/22 0001       Vestibular Treatment/Exercise   Vestibular Treatment Provided Canalith Repositioning    Canalith Repositioning Epley  Manuever Right       EPLEY MANUEVER RIGHT   Number of Reps  1    Overall Response Improved Symptoms  PT Education - 01/05/22 1610     Education Details prognosis, POC, anatomy and etiology of BPPV and hypofunction    Person(s) Educated Patient    Methods Explanation;Demonstration;Tactile cues;Verbal cues;Handout    Comprehension Verbalized understanding              PT Short Term Goals - 01/05/22 1713       PT SHORT TERM GOAL #1   Title Patient to be independent with initial HEP.    Time 3    Period Weeks    Status New    Target Date 01/26/22               PT Long Term Goals - 01/05/22 1713       PT LONG TERM GOAL #1   Title Patient to be independent with advanced HEP.    Time 6    Period Weeks    Status New    Target Date 02/16/22      PT LONG TERM GOAL #2   Title Patient to demonstrate negative R DH.    Time 6    Period Weeks    Status New    Target Date 02/16/22      PT LONG TERM GOAL #3   Title Patient will report 0/10 dizziness with bed mobility.    Time 6    Period Weeks    Status New    Target Date 02/16/22      PT LONG TERM GOAL #4   Title Patient to report 0/10 dizziness with standing vertical and horizontal VOR for 30 seconds.    Time 6    Period Weeks    Status New    Target Date 02/16/22      PT LONG TERM GOAL #5   Title Patient to report 70% improvement in dizziness.    Time 6    Period Weeks    Status New    Target Date 02/16/22                    Plan - 01/05/22 1709     Clinical Impression Statement Patient is a 51 y/o M presenting to OPPT with c/o dizziness for the past 2 years. Episodes are described as spinning and last seconds-hours and even lasting all day. Worse with sudden turns, rolling, getting out of bed. Reports congenital B hearing loss, R ear tinnitus and phonophobia, occasional HAs, some neck stiffness. Denies head trauma, infection/illness, vision changes/double  vision, hearing loss, otalgia, photophobia. Patient today presenting with dizziness with R/L HIT, horizontal and vertical VOR, and VOR cancellation. Positional testing was positive for R posterior canalithiasis which was treated with R Epley. Patient tolerated tx well and reported understanding of edu on BPPV, hypofunction etiology and anatomy. Would benefit from skilled PT services 2x/week for 6 weeks to address aforementioned impairments in order to optimize level of function.    Personal Factors and Comorbidities Age;Comorbidity 3+;Time since onset of injury/illness/exacerbation;Past/Current Experience    Comorbidities anxiety, depression, impaired hearing, panic disoder, shoulder surgery    Examination-Activity Limitations Bathing;Locomotion Level;Bed Mobility;Reach Overhead;Bend;Carry;Squat;Dressing;Stairs;Stand;Hygiene/Grooming;Lift;Toileting    Examination-Participation Restrictions Laundry;Driving;Community Activity;Cleaning;Occupation;Church;Meal Prep    Stability/Clinical Decision Making Evolving/Moderate complexity    Clinical Decision Making Moderate    Rehab Potential Good    PT Frequency 2x / week    PT Duration 6 weeks    PT Treatment/Interventions ADLs/Self Care Home Management;Canalith Repostioning;Electrical Stimulation;Moist Heat;Gait training;Stair training;Functional mobility training;Therapeutic activities;Therapeutic exercise;Balance training;Neuromuscular re-education;Manual techniques;Patient/family education;Passive range of motion;Dry  needling;Vestibular;Taping    PT Next Visit Plan reassess R DH, initiate VOR and habituation    Consulted and Agree with Plan of Care Patient             Patient will benefit from skilled therapeutic intervention in order to improve the following deficits and impairments:  Dizziness, Decreased activity tolerance, Decreased balance  Visit Diagnosis: Dizziness and giddiness     Problem List Patient Active Problem List   Diagnosis  Date Noted   Vertigo, intermittent 09/12/2020   Lactic acidemia 09/12/2020   Elevated ferritin 05/18/2020   Family history of hemochromatosis 05/18/2020   Family history of Huntington's disease 05/18/2020   Seasonal allergic rhinitis due to pollen 02/27/2019   Depression with anxiety 02/27/2019   Congenital hearing loss of both ears 05/23/2016   Hydrocele in adult 05/12/2016    Janene Harvey, PT, DPT 01/05/22 5:17 PM   [facility] [address], [place], [place], [address] Phone: [phone]   Fax:  [phone]  Name: Nyron Mozer MRN: [id number] Date of Birth: 10/13/1971

## 2022-01-08 ENCOUNTER — Other Ambulatory Visit: Payer: Self-pay

## 2022-01-08 ENCOUNTER — Ambulatory Visit
Admission: RE | Admit: 2022-01-08 | Discharge: 2022-01-08 | Disposition: A | Discharge status: Home / Self Care | Payer: 59 | Source: Ambulatory Visit | Attending: Physician Assistant | Admitting: Physician Assistant

## 2022-01-08 DIAGNOSIS — M26602 Left temporomandibular joint disorder, unspecified: Secondary | ICD-10-CM | POA: Diagnosis not present

## 2022-01-08 DIAGNOSIS — S0302XA Dislocation of jaw, left side, initial encounter: Secondary | ICD-10-CM | POA: Diagnosis not present

## 2022-01-08 DIAGNOSIS — S0301XA Dislocation of jaw, right side, initial encounter: Secondary | ICD-10-CM | POA: Diagnosis not present

## 2022-01-08 DIAGNOSIS — M26601 Right temporomandibular joint disorder, unspecified: Secondary | ICD-10-CM | POA: Diagnosis not present

## 2022-01-08 DIAGNOSIS — R6884 Jaw pain: Secondary | ICD-10-CM

## 2022-01-08 IMAGING — MR MR [PERSON_NAME]
6 series · 16 of 16 positions shown · non-contrast
Comparison: Face MRI [DATE]

CLINICAL DATA: evaluate TMJ dysfunction

EXAM:
MRI OF TEMPOROMANDIBULAR JOINT WITHOUT CONTRAST
TECHNIQUE: Multiplanar, multisequence MR imaging of the temporomandibular joint
was performed following the standard protocol. No intravenous
contrast was administered.

[Series 3: T1 · axial · 4.0mm · 0.59mm/px · 1 of 13 slices shown]
[im 1/13]
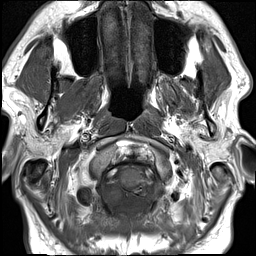

[Series 4: PD · oblique · 4.0mm · 0.44mm/px · 3 of 18 slices shown (1 of 4)]
[im 1/18]
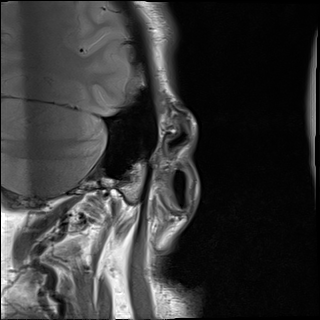
[im 9/18]
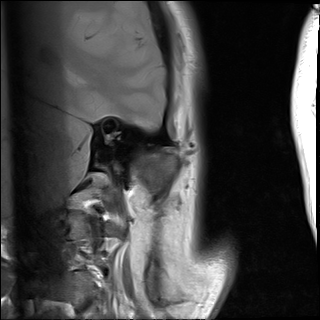
[im 18/18]
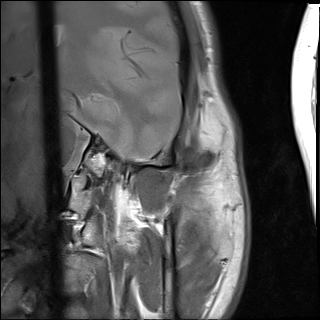

[Series 5: T2 fat-sat · sagittal · 4.0mm · 0.62mm/px · 3 of 18 slices shown]
[im 1/18]
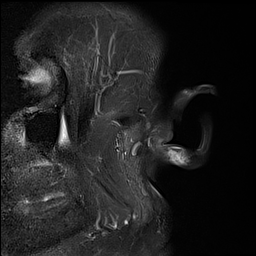
[im 9/18]
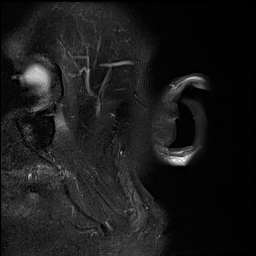
[im 18/18]
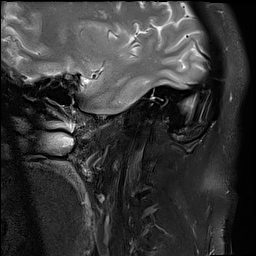

[Series 6: PD · sagittal · 4.0mm · 0.44mm/px · 3 of 18 slices shown (2 of 4)]
[im 1/18]
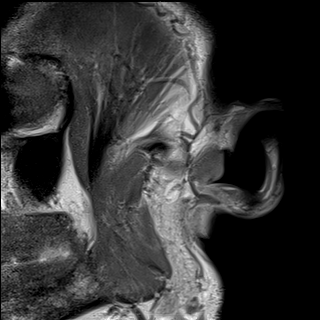
[im 9/18]
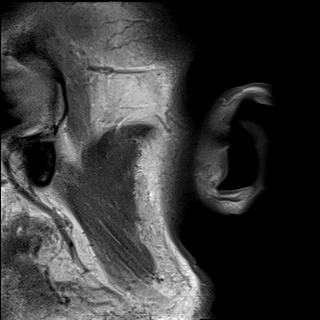
[im 18/18]
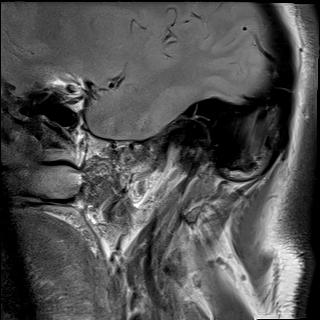

[Series 9: PD · sagittal · 4.0mm · 0.44mm/px · 3 of 18 slices shown (3 of 4)]
[im 1/18]
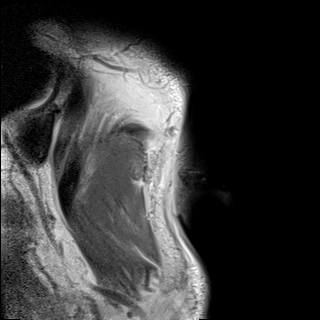
[im 9/18]
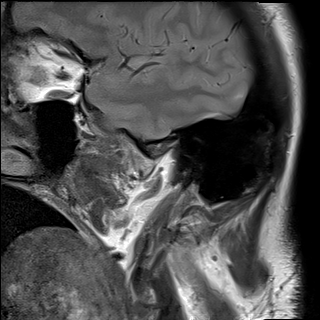
[im 18/18]
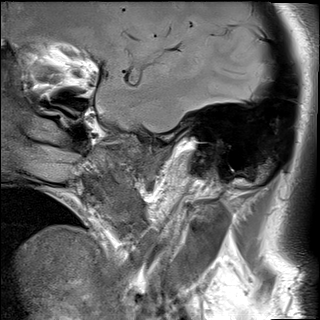

[Series 10: PD · coronal · 4.0mm · 0.44mm/px · 3 of 18 slices shown (4 of 4)]
[im 1/18]
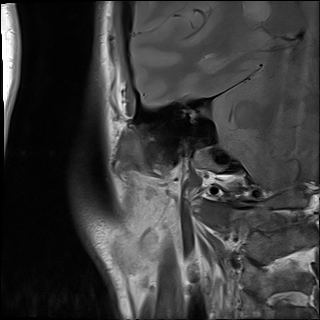
[im 9/18]
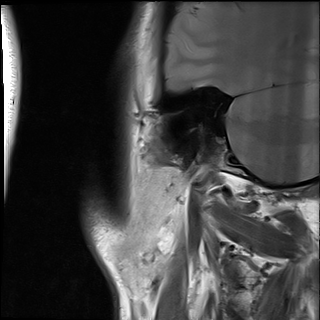
[im 18/18]
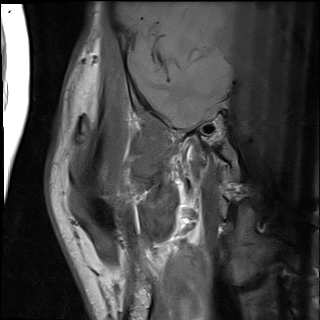

[16 of 16 positions shown; findings below may reference images not displayed]

FINDINGS: Right temporomandibular joint: There is slight anterior displacement
of the right TMJ articular disc. There is recapture with opening.
The intermediate zone of the disc appears very thin in the open
position with near bone on bone articulation possibly reflective of
a central perforation/tear.The mandibular condyle normally
translates anteriorly on jaw opening.There is no joint effusion.
There is mild right TMJ osteoarthritis. No pterygoid muscle
abnormality.

Left temporomandibular joint: Slight anterior displacement of the
left TMJ articular disc in the closed position. Normal translation
of the mandibular condyle with recapture of the disc upon opening. A
no evidence of articular disc perforation/tear.There is no joint
effusion. There is no significant degenerative change. No pterygoid
muscle abnormality.

Other: Left mandibular abnormalities described on prior MRI are not
well seen on this exam in part due to a different imaging protocol
utilized.
IMPRESSION: Right TMJ: Slight anterior displacement of the disc in the closed
position with recapture upon opening. Thinned appearance of the
intermediate zone of the disc in the open position with near bone on
bone articulation possibly reflective of a central perforation/tear.
Mild right TMJ osteoarthritis.

Left TMJ: Slight anterior displacement of the disc in the closed
position with recapture upon opening. No evidence of left TMJ disc
perforation/tear. No significant left TMJ degenerative change.

## 2022-01-10 ENCOUNTER — Other Ambulatory Visit: Payer: Self-pay

## 2022-01-10 ENCOUNTER — Encounter: Payer: Self-pay | Admitting: Physical Therapy

## 2022-01-10 ENCOUNTER — Ambulatory Visit: Payer: 59 | Admitting: Physical Therapy

## 2022-01-10 DIAGNOSIS — R42 Dizziness and giddiness: Secondary | ICD-10-CM | POA: Diagnosis not present

## 2022-01-10 NOTE — Patient Instructions (Signed)
Access Code: [email or web address] ?URL: [email or web address] ?Date: 01/10/2022 ?Prepared by: [facility] ? ?Program Notes ?perform at a speed to reach 3-4/10 dizziness ? ? ?Exercises ?Seated Gaze Stabilization with Head Rotation - 2 x daily - 5 x weekly - 2 sets - 10 hold ?Seated Gaze Stabilization with Head Nod - 2 x daily - 5 x weekly - 2 sets - 10 hold ?Seated VOR Cancellation - 2 x daily - 5 x weekly - 2 sets - 30 sec hold ?Romberg Stance with Eyes Closed - 1 x daily - 5 x weekly - 2 sets - 30 sec hold ? ?

## 2022-01-10 NOTE — Therapy (Signed)
[facility] ?[employer] [address] ?[place], [place], [address] ?Phone: [phone]   Fax:  [phone] ? ?Physical Therapy Treatment ? ?Patient Details  ?Name: Chris Robertson ?MRN: [id number] ?Date of Birth: 1971/03/11 ?Referring Provider (PT): Inda Coke, [place] ? ? ?Encounter Date: 01/10/2022 ? ? PT End of Session - 01/10/22 0845   ? ? Visit Number 2   ? Number of Visits 13   ? Date for PT Re-Evaluation 02/16/22   ? Authorization Type [facility]   ? Authorization - Visit Number 2   ? Authorization - Number of Visits 25   ? PT Start Time 0800   ? PT Stop Time 0844   ? PT Time Calculation (min) 44 min   ? Activity Tolerance Patient tolerated treatment well   ? Behavior During Therapy [facility] for tasks assessed/performed   ? ?  ?  ? ?  ? ? ?Past Medical History:  ?Diagnosis Date  ? Anxiety   ? Chicken pox   ? Depression   ? DOE (dyspnea on exertion) 10/11/2018  ? Rosanna Randy disease   ? Hearing impaired   ? age 67  ? Heart murmur   ? since childhood  ? Kidney stone   ? Mild mitral valve prolapse 2013  ? Mild MVP with mild MR by echo in 2013, read as trivial in 2019.  ? Near syncope 10/11/2018  ? Panic disorder 2015  ? With agoraphobia  ? Rapid heart beat 10/11/2018  ? Spermatocele 08/2012  ? hyrocele bilateral and spermatocele left; PSA normal 2013-2016  ? Thrombocytopenia ([place])   ? mild, chronic  ? Vitamin D deficiency   ? ? ?Past Surgical History:  ?Procedure Laterality Date  ? CHOLECYSTECTOMY  2007  ? [place]  ? SHOULDER SURGERY  1997  ? TOOTH EXTRACTION    ? TRANSTHORACIC ECHOCARDIOGRAM  10/03/2018  ? Normal LV size and function.  EF 55 to 60%.  No regional wall motion normality.  Trivial late mitral prolapse with trivial regurgitation.  Also trivial AI, TR, PR.  ? ? ?There were no vitals filed for this visit. ? ? Subjective Assessment - 01/10/22 0756   ? ? Subjective Yesterday I felt like 100% but had some dizziness this AM when getting out of bed and getting ready.  Has been able to decrease his dose of Meclizine. Had a TMJ MRI which showed a dislocation of the articulate disc on B sides.   ? Pertinent History anxiety, depression, impaired hearing, panic disoder, shoulder surgery   ? Diagnostic tests 09/18/21 brain MRI:  No acute intracranial abnormality.  Face MRI reported separately. Mild for age nonspecific cerebral white matter signal changes. 09/18/21 face MRI: Constellation suggests active Neuritis secondary to dental or periodontal disease. And mild or early mandible osteomyelitis is  difficult to exclude.   ? Patient Stated Goals improve dizziness   ? Currently in Pain? No/denies   ? ?  ?  ? ?  ? ? ? ? ? ? ? ? ? ? Vestibular Assessment - 01/10/22 0001   ? ?  ? Dix-Hallpike Right  ? Dix-Hallpike Right Duration 30-45 sec   ? Dix-Hallpike Right Symptoms Upbeat, right rotatory nystagmus   no dizziness  ? ?  ?  ? ?  ? ? ? ? ? ? ? ? ? ? ? ? Vestibular Treatment/Exercise - 01/10/22 0001   ? ?  ? Vestibular Treatment/Exercise  ? Habituation Exercises Nestor Lewandowsky   ?  Gaze Exercises X1 Viewing Horizontal;X1 Viewing Vertical;Eye/Head Exercise Horizontal   ?  ?  EPLEY MANUEVER RIGHT  ? Number of Reps  1   ? Overall Response Improved Symptoms   ? Response Details  c/o wooziness on position 2   ?  ? Laruth Bouchard Daroff  ? Number of Reps  1   ? Symptom Description  to each side; c/o mild "spinning" upon sitting up from L; c/o 6-7/10 dizziness when sitting up from R   ?  ? X1 Viewing Horizontal  ? Foot Position sitting   ? Reps --   2x10  ? Comments cues to decrease ROM; c/o 5/10 dizziness; couple episodes of poor gaze stability   ?  ? X1 Viewing Vertical  ? Foot Position sitting   ? Reps --   2x10  ? Comments c/o 5/10 dizziness; couple episodes of poor gaze stability   ?  ? Eye/Head Exercise Horizontal  ? Foot Position sitting   ? Reps --   10x, 2nd set 30 sec  ? Comments VOR cancellation; c/o 6/10 dizziness and sensation of pressure over L side of head; encouraged decreased speed on 2nd  set which improved symptoms   ? ?  ?  ? ?  ? ? ? ? ? Balance Exercises - 01/10/22 0001   ? ?  ? Balance Exercises: Standing  ? Standing Eyes Opened Narrow base of support (BOS);1 rep;30 secs   ? Standing Eyes Closed Narrow base of support (BOS);30 secs;2 reps   mild-mod sway  ? ?  ?  ? ?  ? ? ? ? ? PT Education - 01/10/22 0844   ? ? Education Details HEP- Access Code: [email or web address]; answered patient's questions on returning to workout and adjusting sleeping position   ? Person(s) Educated Patient   ? Methods Explanation;Demonstration;Tactile cues;Verbal cues;Handout   ? Comprehension Verbalized understanding;Returned demonstration   ? ?  ?  ? ?  ? ? ? PT Short Term Goals - 01/10/22 1028   ? ?  ? PT SHORT TERM GOAL #1  ? Title Patient to be independent with initial HEP.   ? Time 3   ? Period Weeks   ? Status On-going   ? Target Date 01/26/22   ? ?  ?  ? ?  ? ? ? ? PT Long Term Goals - 01/10/22 1029   ? ?  ? PT LONG TERM GOAL #1  ? Title Patient to be independent with advanced HEP.   ? Time 6   ? Period Weeks   ? Status On-going   ? Target Date 02/16/22   ?  ? PT LONG TERM GOAL #2  ? Title Patient to demonstrate negative R DH.   ? Time 6   ? Period Weeks   ? Status On-going   ? Target Date 02/16/22   ?  ? PT LONG TERM GOAL #3  ? Title Patient will report 0/10 dizziness with bed mobility.   ? Time 6   ? Period Weeks   ? Status On-going   ? Target Date 02/16/22   ?  ? PT LONG TERM GOAL #4  ? Title Patient to report 0/10 dizziness with standing vertical and horizontal VOR for 30 seconds.   ? Time 6   ? Period Weeks   ? Status On-going   ? Target Date 02/16/22   ?  ? PT LONG TERM GOAL #5  ? Title Patient to report 70% improvement in dizziness.   ?  Time 6   ? Period Weeks   ? Status On-going   ? Target Date 02/16/22   ? ?  ?  ? ?  ? ? ? ? ? ? ? ? Plan - 01/10/22 0846   ? ? Clinical Impression Statement Patient arrived to session with report of improving symptoms; still dizziness evident this AM when getting out of bed and  getting ready. Notes that he Had a TMJ MRI which showed a dislocation of the articulate disc on B sides. Initiated VOR activities in sitting with patient demonstrating improved tolerance for these activities compared to eval, but still with c/o moderate dizziness and observed difficulty with gaze stabilization. Provided cues to decrease ROM and speed to adjust symptom intensity. Habituation brought on symptoms from R>L side. Increased sway evident with EC vs EO but improved with practice. Reassessed R DH which was negative for dizziness but with evident nystagmus. Treated with R Epley with c/o wooziness upon L head turn. Patient tolerated session well and without complaints upon leaving.   ? Personal Factors and Comorbidities Age;Comorbidity 3+;Time since onset of injury/illness/exacerbation;Past/Current Experience   ? Comorbidities anxiety, depression, impaired hearing, panic disoder, shoulder surgery   ? Examination-Activity Limitations Bathing;Locomotion Level;Bed Mobility;Reach Overhead;Bend;Carry;Squat;Dressing;Stairs;Stand;Hygiene/Grooming;Lift;Toileting   ? Examination-Participation Restrictions Laundry;Driving;Community Activity;Cleaning;Occupation;Church;Meal Prep   ? Stability/Clinical Decision Making Evolving/Moderate complexity   ? Rehab Potential Good   ? PT Frequency 2x / week   ? PT Duration 6 weeks   ? PT Treatment/Interventions ADLs/Self Care Home Management;Canalith Repostioning;Electrical Stimulation;Moist Heat;Gait training;Stair training;Functional mobility training;Therapeutic activities;Therapeutic exercise;Balance training;Neuromuscular re-education;Manual techniques;Patient/family education;Passive range of motion;Dry needling;Vestibular;Taping   ? PT Next Visit Plan progress VOR and habituation   ? Consulted and Agree with Plan of Care Patient   ? ?  ?  ? ?  ? ? ?Patient will benefit from skilled therapeutic intervention in order to improve the following deficits and impairments:   Dizziness, Decreased activity tolerance, Decreased balance ? ?Visit Diagnosis: ?Dizziness and giddiness ? ? ? ? ?Problem List ?Patient Active Problem List  ? Diagnosis Date Noted  ? Vertigo, intermittent 09/12/2020

## 2022-01-12 ENCOUNTER — Ambulatory Visit: Payer: 59 | Admitting: Physical Therapy

## 2022-01-12 ENCOUNTER — Other Ambulatory Visit: Payer: Self-pay

## 2022-01-12 ENCOUNTER — Encounter: Payer: Self-pay | Admitting: Physical Therapy

## 2022-01-12 DIAGNOSIS — R42 Dizziness and giddiness: Secondary | ICD-10-CM | POA: Diagnosis not present

## 2022-01-12 NOTE — Therapy (Signed)
[facility] ?[address], [address] ?[place], [place], [address] ?Phone: [phone]   Fax:  [phone] ? ?Physical Therapy Treatment ? ?Patient Details  ?Name: Chris Robertson ?MRN: [id number] ?Date of Birth: [birth year] ?Referring Provider (PT): Inda Coke, [place] ? ? ?Encounter Date: 01/12/2022 ? ? PT End of Session - 01/12/22 0928   ? ? Visit Number 3   ? Number of Visits 13   ? Date for PT Re-Evaluation 02/16/22   ? Authorization Type [facility]   ? Authorization - Visit Number 3   ? Authorization - Number of Visits 25   ? PT Start Time [phone]   ? PT Stop Time 6283   ? PT Time Calculation (min) 41 min   ? Activity Tolerance Patient tolerated treatment well   ? Behavior During Therapy [facility] for tasks assessed/performed   ? ?  ?  ? ?  ? ? ?Past Medical History:  ?Diagnosis Date  ? Anxiety   ? Chicken pox   ? Depression   ? DOE (dyspnea on exertion) 10/11/2018  ? Rosanna Randy disease   ? Hearing impaired   ? age 49  ? Heart murmur   ? since childhood  ? Kidney stone   ? Mild mitral valve prolapse 2013  ? Mild MVP with mild MR by echo in 2013, read as trivial in 2019.  ? Near syncope 10/11/2018  ? Panic disorder 2015  ? With agoraphobia  ? Rapid heart beat 10/11/2018  ? Spermatocele 08/2012  ? hyrocele bilateral and spermatocele left; PSA normal 2013-2016  ? Thrombocytopenia ([place])   ? mild, chronic  ? Vitamin D deficiency   ? ? ?Past Surgical History:  ?Procedure Laterality Date  ? CHOLECYSTECTOMY  2007  ? [place]  ? SHOULDER SURGERY  1997  ? TOOTH EXTRACTION    ? TRANSTHORACIC ECHOCARDIOGRAM  10/03/2018  ? Normal LV size and function.  EF 55 to 60%.  No regional wall motion normality.  Trivial late mitral prolapse with trivial regurgitation.  Also trivial AI, TR, PR.  ? ? ?There were no vitals filed for this visit. ? ? Subjective Assessment - 01/12/22 0847   ? ? Subjective Had to take 1 Meclizine last night but none yet today. Able to cook his favorite meal last night which  he has not able to do in a long time   ? Pertinent History anxiety, depression, impaired hearing, panic disoder, shoulder surgery   ? Diagnostic tests 09/18/21 brain MRI:  No acute intracranial abnormality.  Face MRI reported separately. Mild for age nonspecific cerebral white matter signal changes. 09/18/21 face MRI: Constellation suggests active Neuritis secondary to dental or periodontal disease. And mild or early mandible osteomyelitis is  difficult to exclude.   ? Patient Stated Goals improve dizziness   ? Currently in Pain? Yes   ? Pain Score 7    ? Pain Location Jaw   ? Pain Orientation Left   ? Pain Descriptors / Indicators Constant   ? Pain Type Chronic pain   ? ?  ?  ? ?  ? ? ? ? ? ? ? ? ? ? Vestibular Assessment - 01/12/22 0001   ? ?  ? Dix-Hallpike Right  ? Dix-Hallpike Right Duration unfatiguable   ? Dix-Hallpike Right Symptoms Upbeat, right rotatory nystagmus   no dizziness  ?  ? Dix-Hallpike Left  ? Dix-Hallpike Left Duration 0   ? Dix-Hallpike Left Symptoms No nystagmus   ?  ?  Horizontal Canal Left  ? Horizontal Canal Left Duration 0   ? Horizontal Canal Left Symptoms Normal   c/o mild dizziness with latency  ? ?  ?  ? ?  ? ? ? ? ? ? ? ? ? ? ? [place] Adult PT Treatment/Exercise - 01/12/22 0001   ? ?  ? Neuro Re-ed   ? Neuro Re-ed Details  R/L forearm prop + gaze stabilization 10x, 2x5 with EC with pressure/"heavy weight" over R side of head/face   ? ?  ?  ? ?  ? ? Vestibular Treatment/Exercise - 01/12/22 0001   ? ?  ? Laruth Bouchard Daroff  ? Number of Reps  4   ? Symptom Description  3x EO, 1x EC to each side; c/o mild rush of dizziness upon sitting up from each side which improved with subsequent reps; report of pressure over the R side of the head with EC   ?  ? X1 Viewing Horizontal  ? Foot Position standing   ? Reps --   30"  ? Comments no dizziness; few episodes of limited gaze stability   ?  ? X1 Viewing Vertical  ? Foot Position standing   ? Reps --   30"  ? Comments no dizziness   ?  ? Eye/Head  Exercise Horizontal  ? Foot Position standing   ? Reps --   30"  ? Comments VOR cancellation   ? ?  ?  ? ?  ? ? ? ? ? Balance Exercises - 01/12/22 0001   ? ?  ? Balance Exercises: Standing  ? Standing Eyes Closed Narrow base of support (BOS);30 secs;1 rep;Solid surface   ? ?  ?  ? ?  ? ? ? ? ? PT Education - 01/12/22 0928   ? ? Education Details update to HEP; progressed VOR to standing   ? Person(s) Educated Patient   ? Methods Explanation;Demonstration;Tactile cues;Verbal cues;Handout   ? Comprehension Verbalized understanding;Returned demonstration   ? ?  ?  ? ?  ? ? ? PT Short Term Goals - 01/12/22 0929   ? ?  ? PT SHORT TERM GOAL #1  ? Title Patient to be independent with initial HEP.   ? Time 3   ? Period Weeks   ? Status Achieved   ? Target Date 01/26/22   ? ?  ?  ? ?  ? ? ? ? PT Long Term Goals - 01/10/22 1029   ? ?  ? PT LONG TERM GOAL #1  ? Title Patient to be independent with advanced HEP.   ? Time 6   ? Period Weeks   ? Status On-going   ? Target Date 02/16/22   ?  ? PT LONG TERM GOAL #2  ? Title Patient to demonstrate negative R DH.   ? Time 6   ? Period Weeks   ? Status On-going   ? Target Date 02/16/22   ?  ? PT LONG TERM GOAL #3  ? Title Patient will report 0/10 dizziness with bed mobility.   ? Time 6   ? Period Weeks   ? Status On-going   ? Target Date 02/16/22   ?  ? PT LONG TERM GOAL #4  ? Title Patient to report 0/10 dizziness with standing vertical and horizontal VOR for 30 seconds.   ? Time 6   ? Period Weeks   ? Status On-going   ? Target Date 02/16/22   ?  ? PT LONG  TERM GOAL #5  ? Title Patient to report 70% improvement in dizziness.   ? Time 6   ? Period Weeks   ? Status On-going   ? Target Date 02/16/22   ? ?  ?  ? ?  ? ? ? ? ? ? ? ? Plan - 01/12/22 0928   ? ? Clinical Impression Statement Patient arrived to session with report of improving tolerance for daily functional activities; notes that he was able to cook his favorite meal last night which he has not able to do in a long time.  Assessed L roll test and DH which were negative. R DH was negative for dizziness but still with R upbeating nystagmus. Opted to treat with habituation. Patient tolerated Nestor Lewandowsky to B sides with c/o mild rush of dizziness upon sitting up from each side which improved with subsequent reps. Attempted with EC, which brought on more intense symptoms on R vs L side, however patient able to complete this exercise on 2nd try. Patient performed other motion sensitivity activities with EC with good tolerance. Patient now without c/o symptoms with VOR activities however still with minor episodes of limited gaze stability. Overall patient is progressing very well and without complaints at end of session.   ? Personal Factors and Comorbidities Age;Comorbidity 3+;Time since onset of injury/illness/exacerbation;Past/Current Experience   ? Comorbidities anxiety, depression, impaired hearing, panic disoder, shoulder surgery   ? Examination-Activity Limitations Bathing;Locomotion Level;Bed Mobility;Reach Overhead;Bend;Carry;Squat;Dressing;Stairs;Stand;Hygiene/Grooming;Lift;Toileting   ? Examination-Participation Restrictions Laundry;Driving;Community Activity;Cleaning;Occupation;Church;Meal Prep   ? Stability/Clinical Decision Making Evolving/Moderate complexity   ? Rehab Potential Good   ? PT Frequency 2x / week   ? PT Duration 6 weeks   ? PT Treatment/Interventions ADLs/Self Care Home Management;Canalith Repostioning;Electrical Stimulation;Moist Heat;Gait training;Stair training;Functional mobility training;Therapeutic activities;Therapeutic exercise;Balance training;Neuromuscular re-education;Manual techniques;Patient/family education;Passive range of motion;Dry needling;Vestibular;Taping   ? PT Next Visit Plan progress VOR and habituation   ? Consulted and Agree with Plan of Care Patient   ? ?  ?  ? ?  ? ? ?Patient will benefit from skilled therapeutic intervention in order to improve the following deficits and impairments:   Dizziness, Decreased activity tolerance, Decreased balance ? ?Visit Diagnosis: ?Dizziness and giddiness ? ? ? ? ?Problem List ?Patient Active Problem List  ? Diagnosis Date Noted  ? Vertigo, intermittent 11/

## 2022-01-16 ENCOUNTER — Other Ambulatory Visit: Payer: Self-pay

## 2022-01-16 ENCOUNTER — Encounter: Payer: Self-pay | Admitting: Physical Therapy

## 2022-01-16 ENCOUNTER — Ambulatory Visit: Payer: 59 | Admitting: Physical Therapy

## 2022-01-16 DIAGNOSIS — R42 Dizziness and giddiness: Secondary | ICD-10-CM | POA: Diagnosis not present

## 2022-01-16 NOTE — Therapy (Signed)
[place] ?[address], [address] ?[place], [place], [address] ?Phone: [phone]   Fax:  [phone] ? ?Physical Therapy Treatment ? ?Patient Details  ?Name: Chris Robertson ?MRN: [id number] ?Date of Birth: 1970-11-29 ?Referring Provider (PT): Inda Coke, [place] ? ? ?Encounter Date: 01/16/2022 ? ? PT End of Session - 01/16/22 7622   ? ? Visit Number 4   ? Number of Visits 13   ? Date for PT Re-Evaluation 02/16/22   ? Authorization Type [facility]   ? Authorization - Visit Number 4   ? Authorization - Number of Visits 25   ? PT Start Time [phone]   ? PT Stop Time [phone]   ? PT Time Calculation (min) 41 min   ? Activity Tolerance Patient tolerated treatment well   ? Behavior During Therapy [facility] for tasks assessed/performed   ? ?  ?  ? ?  ? ? ?Past Medical History:  ?Diagnosis Date  ? Anxiety   ? Chicken pox   ? Depression   ? DOE (dyspnea on exertion) 10/11/2018  ? Rosanna Randy disease   ? Hearing impaired   ? age 51  ? Heart murmur   ? since childhood  ? Kidney stone   ? Mild mitral valve prolapse 2013  ? Mild MVP with mild MR by echo in 2013, read as trivial in 2019.  ? Near syncope 10/11/2018  ? Panic disorder 2015  ? With agoraphobia  ? Rapid heart beat 10/11/2018  ? Spermatocele 08/2012  ? hyrocele bilateral and spermatocele left; PSA normal 2013-2016  ? Thrombocytopenia ([place])   ? mild, chronic  ? Vitamin D deficiency   ? ? ?Past Surgical History:  ?Procedure Laterality Date  ? CHOLECYSTECTOMY  2007  ? [place]  ? SHOULDER SURGERY  1997  ? TOOTH EXTRACTION    ? TRANSTHORACIC ECHOCARDIOGRAM  10/03/2018  ? Normal LV size and function.  EF 55 to 60%.  No regional wall motion normality.  Trivial late mitral prolapse with trivial regurgitation.  Also trivial AI, TR, PR.  ? ? ?There were no vitals filed for this visit. ? ? Subjective Assessment - 01/16/22 0848   ? ? Subjective Had an episode of dizziness on Friday at work, was pushing a cart of stuff and as something was  sliding off, he quickly reached to retrieve it when he had an episode of dizziness. Has be able to do his HEP but it is more challenging on the R.   ? Pertinent History anxiety, depression, impaired hearing, panic disoder, shoulder surgery   ? Diagnostic tests 09/18/21 brain MRI:  No acute intracranial abnormality.  Face MRI reported separately. Mild for age nonspecific cerebral white matter signal changes. 09/18/21 face MRI: Constellation suggests active Neuritis secondary to dental or periodontal disease. And mild or early mandible osteomyelitis is  difficult to exclude.   ? Patient Stated Goals improve dizziness   ? Currently in Pain? Yes   ? Pain Score 2    ? Pain Location Jaw   ? Pain Orientation Left;Right   ? Pain Descriptors / Indicators Constant   ? Pain Type Chronic pain   ? ?  ?  ? ?  ? ? ? ? ? ? ? ? ? ? Vestibular Assessment - 01/16/22 0001   ? ?  ? Dix-Hallpike Right  ? Dix-Hallpike Right Duration 45 sec   ? Dix-Hallpike Right Symptoms Upbeat, right rotatory nystagmus   c/o mild dizziness  ?  ?  Horizontal Canal Right  ? Horizontal Canal Right Duration 0   ? Horizontal Canal Right Symptoms Normal   ?  ? Horizontal Canal Left  ? Horizontal Canal Left Duration 0   ? Horizontal Canal Left Symptoms Normal   ? ?  ?  ? ?  ? ? ? ? ? ? ? ? ? ? ? [place] Adult PT Treatment/Exercise - 01/16/22 0001   ? ?  ? Neuro Re-ed   ? Neuro Re-ed Details  STS on foam + gaze stabilization 2x5 with/without UE support, STS on foam + EC 2x with poor tolerance, 3x without foam with slightly improved tolerance   ? ?  ?  ? ?  ? ? Vestibular Treatment/Exercise - 01/16/22 0001   ? ?  ?  EPLEY MANUEVER RIGHT  ? Number of Reps  1   ? Overall Response Improved Symptoms   ? Response Details  c/o short lasting and latent dizziness on position 2   ?  ? Laruth Bouchard Daroff  ? Number of Reps  4   ? Symptom Description  dizziness 0/10 L, 1-2/10 R with EO; 5/10 L, 6-7/10 R with EO   ? ?  ?  ? ?  ? ? ? ? ? ? ? ? ? PT Education - 01/16/22 0928   ? ?  Education Details update to HEP; edu on test results and functional relevance   ? Person(s) Educated Patient   ? Methods Explanation;Demonstration;Verbal cues;Tactile cues;Handout   ? Comprehension Returned demonstration;Verbalized understanding   ? ?  ?  ? ?  ? ? ? PT Short Term Goals - 01/12/22 0929   ? ?  ? PT SHORT TERM GOAL #1  ? Title Patient to be independent with initial HEP.   ? Time 3   ? Period Weeks   ? Status Achieved   ? Target Date 01/26/22   ? ?  ?  ? ?  ? ? ? ? PT Long Term Goals - 01/10/22 1029   ? ?  ? PT LONG TERM GOAL #1  ? Title Patient to be independent with advanced HEP.   ? Time 6   ? Period Weeks   ? Status On-going   ? Target Date 02/16/22   ?  ? PT LONG TERM GOAL #2  ? Title Patient to demonstrate negative R DH.   ? Time 6   ? Period Weeks   ? Status On-going   ? Target Date 02/16/22   ?  ? PT LONG TERM GOAL #3  ? Title Patient will report 0/10 dizziness with bed mobility.   ? Time 6   ? Period Weeks   ? Status On-going   ? Target Date 02/16/22   ?  ? PT LONG TERM GOAL #4  ? Title Patient to report 0/10 dizziness with standing vertical and horizontal VOR for 30 seconds.   ? Time 6   ? Period Weeks   ? Status On-going   ? Target Date 02/16/22   ?  ? PT LONG TERM GOAL #5  ? Title Patient to report 70% improvement in dizziness.   ? Time 6   ? Period Weeks   ? Status On-going   ? Target Date 02/16/22   ? ?  ?  ? ?  ? ? ? ? ? ? ? ? Plan - 01/16/22 0929   ? ? Clinical Impression Statement Patient arrived to session with report of an episode of dizziness that occurred on Friday  while at work; reporting symptoms are more significant to the R. Patient with c/o mild dizziness and R upbeating torsional nystagmus with R DH. Habituation was well-tolerated but still with significant increase with EC activities, indicating need to up-regulate vestibular system with balance activities. Initiated STS transfers with EC; patient with poor tolerance on foam d/t c/o 8/10 dizziness; improved tolerance without  foam. End of session focused on repositioning with R Epley, which patient tolerated well. No complaints at end of session.   ? Personal Factors and Comorbidities Age;Comorbidity 3+;Time since onset of injury/illness/exacerbation;Past/Current Experience   ? Comorbidities anxiety, depression, impaired hearing, panic disoder, shoulder surgery   ? Examination-Activity Limitations Bathing;Locomotion Level;Bed Mobility;Reach Overhead;Bend;Carry;Squat;Dressing;Stairs;Stand;Hygiene/Grooming;Lift;Toileting   ? Examination-Participation Restrictions Laundry;Driving;Community Activity;Cleaning;Occupation;Church;Meal Prep   ? Stability/Clinical Decision Making Evolving/Moderate complexity   ? Rehab Potential Good   ? PT Frequency 2x / week   ? PT Duration 6 weeks   ? PT Treatment/Interventions ADLs/Self Care Home Management;Canalith Repostioning;Electrical Stimulation;Moist Heat;Gait training;Stair training;Functional mobility training;Therapeutic activities;Therapeutic exercise;Balance training;Neuromuscular re-education;Manual techniques;Patient/family education;Passive range of motion;Dry needling;Vestibular;Taping   ? PT Next Visit Plan progress VOR and habituation   ? Consulted and Agree with Plan of Care Patient   ? ?  ?  ? ?  ? ? ?Patient will benefit from skilled therapeutic intervention in order to improve the following deficits and impairments:  Dizziness, Decreased activity tolerance, Decreased balance ? ?Visit Diagnosis: ?Dizziness and giddiness ? ? ? ? ?Problem List ?Patient Active Problem List  ? Diagnosis Date Noted  ? Vertigo, intermittent 09/12/2020  ? Lactic acidemia 09/12/2020  ? Elevated ferritin 05/18/2020  ? Family history of hemochromatosis 05/18/2020  ? Family history of Huntington's disease 05/18/2020  ? Seasonal allergic rhinitis due to pollen 02/27/2019  ? Depression with anxiety 02/27/2019  ? Congenital hearing loss of both ears 05/23/2016  ? Hydrocele in adult 05/12/2016  ? ? ?Janene Harvey,  PT, DPT ?01/16/22 9:30 AM ? ? ?[facility] ?[facility] ?[address], [address] ?[place], [place], [address] ?Phone: [phone]   Fax:  [phone] ? ?Name: Chris Robertson ?MRN: [id number]

## 2022-01-18 ENCOUNTER — Encounter: Payer: 59 | Admitting: Physical Therapy

## 2022-01-20 ENCOUNTER — Other Ambulatory Visit: Payer: Self-pay

## 2022-01-20 ENCOUNTER — Ambulatory Visit: Payer: 59 | Admitting: Rehabilitative and Restorative Service Providers"

## 2022-01-20 DIAGNOSIS — R42 Dizziness and giddiness: Secondary | ICD-10-CM

## 2022-01-20 NOTE — Therapy (Signed)
[place] ?[facility] ?[address], [address] ?[place], [place], [address] ?Phone: [phone]   Fax:  [phone] ? ?Physical Therapy Treatment ? ?Patient Details  ?Name: Chris Robertson ?MRN: [id number] ?Date of Birth: 19-Mar-1971 ?Referring Provider (PT): Inda Coke, [place] ? ? ?Encounter Date: 01/20/2022 ? ? PT End of Session - 01/20/22 0817   ? ? Visit Number 5   ? Number of Visits 13   ? Date for PT Re-Evaluation 02/16/22   ? Authorization Type [facility]   ? Authorization - Visit Number 5   ? Authorization - Number of Visits 25   ? PT Start Time 0801   ? PT Stop Time 0845   ? PT Time Calculation (min) 44 min   ? Activity Tolerance Patient tolerated treatment well   ? Behavior During Therapy [facility] for tasks assessed/performed   ? ?  ?  ? ?  ? ? ?Past Medical History:  ?Diagnosis Date  ? Anxiety   ? Chicken pox   ? Depression   ? DOE (dyspnea on exertion) 10/11/2018  ? Rosanna Randy disease   ? Hearing impaired   ? age 51  ? Heart murmur   ? since childhood  ? Kidney stone   ? Mild mitral valve prolapse 2013  ? Mild MVP with mild MR by echo in 2013, read as trivial in 2019.  ? Near syncope 10/11/2018  ? Panic disorder 2015  ? With agoraphobia  ? Rapid heart beat 10/11/2018  ? Spermatocele 08/2012  ? hyrocele bilateral and spermatocele left; PSA normal 2013-2016  ? Thrombocytopenia ([place])   ? mild, chronic  ? Vitamin D deficiency   ? ? ?Past Surgical History:  ?Procedure Laterality Date  ? CHOLECYSTECTOMY  2007  ? [place]  ? SHOULDER SURGERY  1997  ? TOOTH EXTRACTION    ? TRANSTHORACIC ECHOCARDIOGRAM  10/03/2018  ? Normal LV size and function.  EF 55 to 60%.  No regional wall motion normality.  Trivial late mitral prolapse with trivial regurgitation.  Also trivial AI, TR, PR.  ? ? ?There were no vitals filed for this visit. ? ? Subjective Assessment - 01/20/22 0803   ? ? Subjective The patient reports that he got a worsening of symptoms about 24 hours after last session and went  into a "dead spin".  He took meclizine to rebalance.  He feels that TMJ is making this worse. Dizziness at rest this morning-- took meclizine this morning (didn't sleep well last night).   ? Pertinent History anxiety, depression, impaired hearing, panic disoder, shoulder surgery   ? Diagnostic tests 09/18/21 brain MRI:  No acute intracranial abnormality.  Face MRI reported separately. Mild for age nonspecific cerebral white matter signal changes. 09/18/21 face MRI: Constellation suggests active Neuritis secondary to dental or periodontal disease. And mild or early mandible osteomyelitis is  difficult to exclude.   ? Patient Stated Goals improve dizziness   ? Currently in Pain? Yes   ? Pain Score 2    ? Pain Location Jaw   ? Pain Orientation Right;Left   ? Pain Descriptors / Indicators Constant   ? Pain Type Chronic pain   ? Pain Onset More than a month ago   ? Pain Frequency Intermittent   ? ?  ?  ? ?  ? ? ? ? ? ? ? ? ? ? Vestibular Assessment - 01/20/22 0809   ? ?  ? Positional Testing  ? Dix-Hallpike Dix-Hallpike Right;Dix-Hallpike Left   ?  Sidelying Test Sidelying Right;Sidelying Left   ? Horizontal Canal Testing Horizontal Canal Right;Horizontal Canal Left   ?  ? Dix-Hallpike Right  ? Dix-Hallpike Right Duration no nystagmus noted-- notes 7/10 dizziness after testing   ? Dix-Hallpike Right Symptoms No nystagmus   ?  ? Dix-Hallpike Left  ? Dix-Hallpike Left Duration none long sit>L Dix hallpike.  Gets moderate symptoms with return to sitting rated 8/10.   ? Dix-Hallpike Left Symptoms No nystagmus   ?  ? Sidelying Right  ? Sidelying Right Duration subjective dizziness, no nystagmus   ? Sidelying Right Symptoms No nystagmus   ?  ? Sidelying Left  ? Sidelying Left Duration none   ? Sidelying Left Symptoms No nystagmus   ?  ? Horizontal Canal Right  ? Horizontal Canal Right Duration 0   ? Horizontal Canal Right Symptoms Normal   ?  ? Horizontal Canal Left  ? Horizontal Canal Left Duration 0   ? Horizontal Canal Left  Symptoms Normal   ? ?  ?  ? ?  ? ? ? ? ? ? ? ? ? ? ? [place] Adult PT Treatment/Exercise - 01/20/22 1042   ? ?  ? Neuro Re-ed   ? Neuro Re-ed Details  Corner balance standing on foam with head rotation x 5 reps, head nod x 5 reps with intermittent finger tip support through wall.   ? ?  ?  ? ?  ? ? Vestibular Treatment/Exercise - 01/20/22 0819   ? ?  ? Vestibular Treatment/Exercise  ? Vestibular Treatment Provided Habituation;Gaze   ? Habituation Exercises Laruth Bouchard Daroff;Horizontal Roll   ? Gaze Exercises X1 Viewing Horizontal;X1 Viewing Vertical   ?  ? Laruth Bouchard Daroff  ? Number of Reps  3   ? Symptom Description  5-6/10 with return to sitting   ?  ? Horizontal Roll  ? Number of Reps  3   ? Symptom Description  symptoms reduce with repetition   ?  ? X1 Viewing Horizontal  ? Foot Position standing feet apart working on speed of movement and increasing duration   ? Comments tolerates >30 seconds well   ?  ? X1 Viewing Vertical  ? Foot Position standing feet apart working on speed of movement and increasing duration   ? Comments tolerates >30 seconds well   ?  ? Eye/Head Exercise Horizontal  ? Foot Position standing   ? Comments VOR cancellation   ? ?  ?  ? ?  ? ? ? ? ? ? ? ? ? PT Education - 01/20/22 1038   ? ? Education Details progressed HEP to include standing VOR and VOR cancellation (he was doing in tall kneel to ground himself at home); discussed purpose of bringing on symptoms with HEP   ? Person(s) Educated Patient   ? Methods Explanation;Demonstration;Handout   ? Comprehension Verbalized understanding;Returned demonstration   ? ?  ?  ? ?  ? ? ? PT Short Term Goals - 01/12/22 0929   ? ?  ? PT SHORT TERM GOAL #1  ? Title Patient to be independent with initial HEP.   ? Time 3   ? Period Weeks   ? Status Achieved   ? Target Date 01/26/22   ? ?  ?  ? ?  ? ? ? ? PT Long Term Goals - 01/10/22 1029   ? ?  ? PT LONG TERM GOAL #1  ? Title Patient to be independent with advanced HEP.   ? Time  6   ? Period Weeks   ? Status  On-going   ? Target Date 02/16/22   ?  ? PT LONG TERM GOAL #2  ? Title Patient to demonstrate negative R DH.   ? Time 6   ? Period Weeks   ? Status On-going   ? Target Date 02/16/22   ?  ? PT LONG TERM GOAL #3  ? Title Patient will report 0/10 dizziness with bed mobility.   ? Time 6   ? Period Weeks   ? Status On-going   ? Target Date 02/16/22   ?  ? PT LONG TERM GOAL #4  ? Title Patient to report 0/10 dizziness with standing vertical and horizontal VOR for 30 seconds.   ? Time 6   ? Period Weeks   ? Status On-going   ? Target Date 02/16/22   ?  ? PT LONG TERM GOAL #5  ? Title Patient to report 70% improvement in dizziness.   ? Time 6   ? Period Weeks   ? Status On-going   ? Target Date 02/16/22   ? ?  ?  ? ?  ? ? ? ? ? ? ? ? Plan - 01/20/22 0818   ? ? Clinical Impression Statement The patient presents with multi-factorial dizziness.  He gets days of constant, spontaneous dizziness for which meclizine helps, he gets motion sensitivity with head and body movements, and he gets intermittent positional vertigo.  In addition, he appears to not have equal sensory weighting during functional movements -- PT working on increasing reliance on vestibular inputs. PT to continue to progress to patient tolerance.   ? Personal Factors and Comorbidities Age;Comorbidity 3+;Time since onset of injury/illness/exacerbation;Past/Current Experience   ? Comorbidities anxiety, depression, impaired hearing, panic disoder, shoulder surgery   ? Examination-Activity Limitations Bathing;Locomotion Level;Bed Mobility;Reach Overhead;Bend;Carry;Squat;Dressing;Stairs;Stand;Hygiene/Grooming;Lift;Toileting   ? Examination-Participation Restrictions Laundry;Driving;Community Activity;Cleaning;Occupation;Church;Meal Prep   ? PT Frequency 2x / week   ? PT Duration 6 weeks   ? PT Treatment/Interventions ADLs/Self Care Home Management;Canalith Repostioning;Electrical Stimulation;Moist Heat;Gait training;Stair training;Functional mobility  training;Therapeutic activities;Therapeutic exercise;Balance training;Neuromuscular re-education;Manual techniques;Patient/family education;Passive range of motion;Dry needling;Vestibular;Taping   ? PT Next Visit Plan pro

## 2022-01-20 NOTE — Patient Instructions (Addendum)
Access Code: [email or web address] ?URL: [email or web address] ?Date: 01/20/2022 ?Prepared by: Rudell Cobb ? ?Program Notes ?perform at a speed to reach 3-4/10 dizziness ? ?Exercises ?- Standing Gaze Stabilization with Head Nod  - 2 x daily - 7 x weekly - 1 sets - 2 reps - 30 seconds hold ?- Standing Gaze Stabilization with Head Rotation  - 2 x daily - 7 x weekly - 1 sets - 2 reps - 30 seconds hold ?- VOR Cancellation  - 2 x daily - 7 x weekly - 1 sets - 1 reps - 30 seconds hold ?- Sit to Stand with Eyes Closed  - 1 x daily - 5 x weekly - 2 sets - 5 reps ?- Standing Balance with Eyes Closed on Foam  - 2 x daily - 7 x weekly - 1 sets - 3 reps - 20 seconds hold ?- Brandt-Daroff Vestibular Exercise  - 1 x daily - 5 x weekly - 2 sets - 3-5 reps ?- Rolling rightleft sides for vestibular habituation  - 2 x daily - 7 x weekly - 1 sets - 3-5 reps ?- Sit to Sidelying with Prop on Forearm  - 1 x daily - 5 x weekly - 2 sets - 10 reps ?

## 2022-01-25 ENCOUNTER — Ambulatory Visit: Payer: 59 | Admitting: Rehabilitative and Restorative Service Providers"

## 2022-01-25 ENCOUNTER — Other Ambulatory Visit: Payer: Self-pay

## 2022-01-25 DIAGNOSIS — R42 Dizziness and giddiness: Secondary | ICD-10-CM | POA: Diagnosis not present

## 2022-01-25 NOTE — Therapy (Signed)
[place] ?[facility] ?[address], [address] ?[place], [place], [address] ?Phone: [phone]   Fax:  [phone] ? ?Physical Therapy Treatment ? ?Patient Details  ?Name: Chris Robertson ?MRN: [id number] ?Date of Birth: [birth year] ?Referring Provider (PT): Inda Coke, [place] ? ? ?Encounter Date: 01/25/2022 ? ? PT End of Session - 01/25/22 8889   ? ? Visit Number 6   ? Number of Visits 13   ? Date for PT Re-Evaluation 02/16/22   ? Authorization Type [facility]   ? Authorization - Visit Number 6   ? Authorization - Number of Visits 25   ? PT Start Time [phone]   ? PT Stop Time 1015   ? PT Time Calculation (min) 41 min   ? Activity Tolerance Patient tolerated treatment well   ? Behavior During Therapy [facility] for tasks assessed/performed   ? ?  ?  ? ?  ? ? ?Past Medical History:  ?Diagnosis Date  ? Anxiety   ? Chicken pox   ? Depression   ? DOE (dyspnea on exertion) 10/11/2018  ? Rosanna Randy disease   ? Hearing impaired   ? age 51  ? Heart murmur   ? since childhood  ? Kidney stone   ? Mild mitral valve prolapse 2013  ? Mild MVP with mild MR by echo in 2013, read as trivial in 2019.  ? Near syncope 10/11/2018  ? Panic disorder 2015  ? With agoraphobia  ? Rapid heart beat 10/11/2018  ? Spermatocele 08/2012  ? hyrocele bilateral and spermatocele left; PSA normal 2013-2016  ? Thrombocytopenia ([place])   ? mild, chronic  ? Vitamin D deficiency   ? ? ?Past Surgical History:  ?Procedure Laterality Date  ? CHOLECYSTECTOMY  2007  ? [place]  ? SHOULDER SURGERY  1997  ? TOOTH EXTRACTION    ? TRANSTHORACIC ECHOCARDIOGRAM  10/03/2018  ? Normal LV size and function.  EF 55 to 60%.  No regional wall motion normality.  Trivial late mitral prolapse with trivial regurgitation.  Also trivial AI, TR, PR.  ? ? ?There were no vitals filed for this visit. ? ? Subjective Assessment - 01/25/22 0934   ? ? Subjective The patient felt great after last session until this morning.  He noticed a return of worsening  dizziness this morning an hour before he got up.  He thinks he slept on his back for too long. Symptoms improved when he rolled to his right.  He currently feels wobbly sitting, walking, and driving.  He rates symptoms a 7/10-- he notes this will probably last all day to a couple of days.  He denies HA, light sensitivity.  He feels tension in his teeth, temporalis, and jaw. He notes that he cannot afford the TMJ dentist-- night guard, exercise, etc.   ? Pertinent History anxiety, depression, impaired hearing, panic disoder, shoulder surgery   ? Patient Stated Goals improve dizziness   ? ?  ?  ? ?  ? ? ? ? ? ? ? ? ? ? Vestibular Assessment - 01/25/22 0944   ? ?  ? Positional Testing  ? Dix-Hallpike Dix-Hallpike Right;Dix-Hallpike Left   ? Sidelying Test Sidelying Right;Sidelying Left   ? Horizontal Canal Testing Horizontal Canal Right;Horizontal Canal Left   ?  ? Dix-Hallpike Right  ? Dix-Hallpike Right Duration low amplitude nystagmus >30 second duration   ? Dix-Hallpike Right Symptoms Upbeat, right rotatory nystagmus   ?  ? Dix-Hallpike Left  ? Dix-Hallpike  Left Duration subjective sense of spinning no nystagmus noted in room light   ? Dix-Hallpike Left Symptoms No nystagmus   ?  ? Sidelying Right  ? Sidelying Right Duration after testing dix hallpike, R sidelying feels normal   ? Sidelying Right Symptoms No nystagmus   ?  ? Sidelying Left  ? Sidelying Left Duration none   ? Sidelying Left Symptoms No nystagmus   ?  ? Horizontal Canal Right  ? Horizontal Canal Right Duration seconds when we first did sit>R sidelying without turning head   ? Horizontal Canal Right Symptoms --   noted mild upbeat, right nystagmus  ?  ? Horizontal Canal Left  ? Horizontal Canal Left Duration 0   ? Horizontal Canal Left Symptoms Normal   ? ?  ?  ? ?  ? ? ? ? ? ? ? ? ? ? ? [place] Adult PT Treatment/Exercise - 01/25/22 0943   ? ?  ? Self-Care  ? Self-Care Other Self-Care Comments   ? 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   ?  ? Manual Therapy  ? Manual Therapy Soft tissue mobilization;Manual Traction;Myofascial release   ? Manual therapy comments supine   ? Soft tissue mobilization L scalenes, SCM, temporalis to tolerance   ? Myofascial Release bilateral suboccipitals   ? Manual Traction supine gentle manual traction   ? ?  ?  ? ?  ? ? Vestibular Treatment/Exercise - 01/25/22 1000   ? ?  ? Vestibular Treatment/Exercise  ? Vestibular Treatment Provided Habituation   ? Habituation Exercises Laruth Bouchard Daroff;Horizontal Roll   ?  ? Laruth Bouchard Daroff  ? Number of Reps  3   ? Symptom Description  dizziness reduces with repetitoin to 3/10 baseline level   ?  ? Horizontal Roll  ? Number of Reps  3   ? Symptom Description  feels mild dizziness to the R side that lasts >30 seconds and heaviness 1st rep to the left; 2nd reps symtpoms were more stable; 3rd  rep no dizziness   ? ?  ?  ? ?  ? ? ? ? ? ? ? ? ? PT Education - 01/25/22 1041   ? ? Education Details adding walking and more movement into day   ? Person(s) Educated Patient   ? Methods Explanation   ? Comprehension Verbalized understanding   ? ?  ?  ? ?  ? ? ? PT Short Term Goals - 01/12/22 0929   ? ?  ? PT SHORT TERM GOAL #1  ? Title Patient to be independent with initial HEP.   ? Time 3   ? Period Weeks   ? Status Achieved   ? Target Date 01/26/22   ? ?  ?  ? ?  ? ? ? ? PT Long Term Goals - 01/10/22 1029   ? ?  ? PT LONG TERM GOAL #1  ? Title Patient to be independent with advanced HEP.   ? Time 6   ? Period Weeks   ? Status On-going   ? Target Date 02/16/22   ?  ? PT LONG TERM GOAL #2  ? Title Patient to demonstrate negative R DH.   ? Time 6   ? Period Weeks   ? Status On-going   ? Target Date 02/16/22   ?  ?  PT LONG TERM GOAL #3  ? Title Patient will report 0/10 dizziness with bed mobility.   ? Time 6   ? Period Weeks   ? Status  On-going   ? Target Date 02/16/22   ?  ? PT LONG TERM GOAL #4  ? Title Patient to report 0/10 dizziness with standing vertical and horizontal VOR for 30 seconds.   ? Time 6   ? Period Weeks   ? Status On-going   ? Target Date 02/16/22   ?  ? PT LONG TERM GOAL #5  ? Title Patient to report 70% improvement in dizziness.   ? Time 6   ? Period Weeks   ? Status On-going   ? Target Date 02/16/22   ? ?  ?  ? ?  ? ? ? ? ? ? ? ? Plan - 01/25/22 0949   ? ? Clinical Impression Statement The patient notes a triggering of room spinning sensation this morning.  He arrives with neck guarding and 7/10 dizziness.  His dizziness initially worsens with suboccipital release. PT focused on how to manage symptoms on a bad day working through neck stretching to reduce muscle guarding, walking, habituation.  Symptoms reduced to 3-4/10 at end of session.  Patient asked questions re: neck contribution to symptoms.  We discussed cervicogenic symptoms are rare, but a possibility.  It is more likely that his chronic, intermittent vertigo has led to behavioral compensations of muscle guarding that may be contributing to neck tightness and potentially worsening prior TMJ issues.  PT to address as needed to work through behavioral guarding and promote improved mobility for daily tasks.   ? Personal Factors and Comorbidities --   ? Comorbidities anxiety, depression, impaired hearing, panic disoder, shoulder surgery   ? Examination-Activity Limitations --   ? Examination-Participation Restrictions --   ? PT Frequency 2x / week   ? PT Duration 6 weeks   ? PT Treatment/Interventions ADLs/Self Care Home Management;Canalith Repostioning;Electrical Stimulation;Moist Heat;Gait training;Stair training;Functional mobility training;Therapeutic activities;Therapeutic exercise;Balance training;Neuromuscular re-education;Manual techniques;Patient/family education;Passive range of motion;Dry needling;Vestibular;Taping   ? PT Next Visit Plan progress VOR and  habituation; increase functional activity potentially adding walking and general conditioning; ask about walking more at home (and trying elliptical if he felt safe standing on it).  Add neck stretching and head m

## 2022-02-01 ENCOUNTER — Ambulatory Visit: Payer: 59 | Attending: Physician Assistant | Admitting: Physical Therapy

## 2022-02-01 ENCOUNTER — Encounter: Payer: Self-pay | Admitting: Physical Therapy

## 2022-02-01 DIAGNOSIS — R42 Dizziness and giddiness: Secondary | ICD-10-CM | POA: Insufficient documentation

## 2022-02-01 NOTE — Therapy (Signed)
[place] ?[facility] ?[address], [address] ?[place], [place], [address] ?Phone: [phone]   Fax:  [phone] ? ?Physical Therapy Treatment ? ?Patient Details  ?Name: Chris Robertson ?MRN: [id number] ?Date of Birth: 25-Sep-1971 ?Referring Provider (PT): Inda Coke, [place] ? ? ?Encounter Date: 02/01/2022 ? ? PT End of Session - 02/01/22 0931   ? ? Visit Number 7   ? Number of Visits 13   ? Date for PT Re-Evaluation 02/16/22   ? Authorization Type [facility]   ? Authorization - Visit Number 7   ? Authorization - Number of Visits 25   ? PT Start Time [phone]   ? PT Stop Time [phone]   ? PT Time Calculation (min) 41 min   ? Equipment Utilized During Treatment Gait belt   ? Activity Tolerance Patient tolerated treatment well   ? Behavior During Therapy [facility] for tasks assessed/performed   ? ?  ?  ? ?  ? ? ?Past Medical History:  ?Diagnosis Date  ? Anxiety   ? Chicken pox   ? Depression   ? DOE (dyspnea on exertion) 10/11/2018  ? Rosanna Randy disease   ? Hearing impaired   ? age 2  ? Heart murmur   ? since childhood  ? Kidney stone   ? Mild mitral valve prolapse 2013  ? Mild MVP with mild MR by echo in 2013, read as trivial in 2019.  ? Near syncope 10/11/2018  ? Panic disorder 2015  ? With agoraphobia  ? Rapid heart beat 10/11/2018  ? Spermatocele 08/2012  ? hyrocele bilateral and spermatocele left; PSA normal 2013-2016  ? Thrombocytopenia ([place])   ? mild, chronic  ? Vitamin D deficiency   ? ? ?Past Surgical History:  ?Procedure Laterality Date  ? CHOLECYSTECTOMY  2007  ? Home  ? SHOULDER SURGERY  1997  ? TOOTH EXTRACTION    ? TRANSTHORACIC ECHOCARDIOGRAM  10/03/2018  ? Normal LV size and function.  EF 55 to 60%.  No regional wall motion normality.  Trivial late mitral prolapse with trivial regurgitation.  Also trivial AI, TR, PR.  ? ? ?There were no vitals filed for this visit. ? ? Subjective Assessment - 02/01/22 0847   ? ? Subjective Things are much better since last appointment- felt a  lot of tension release with massage. Did feel like the dizziness was aggravated a bit wit tossing and turning last night. Current dizziness 4-5/10. Walks while at work but and a little bit for self-care. Worried about collapsing while walking d/t a hx of dehydration requiring ambulance a couple years ago.   ? Pertinent History anxiety, depression, impaired hearing, panic disoder, shoulder surgery   ? Diagnostic tests 09/18/21 brain MRI:  No acute intracranial abnormality.  Face MRI reported separately. Mild for age nonspecific cerebral white matter signal changes. 09/18/21 face MRI: Constellation suggests active Neuritis secondary to dental or periodontal disease. And mild or early mandible osteomyelitis is  difficult to exclude.   ? Patient Stated Goals improve dizziness   ? Currently in Pain? Yes   ? Pain Score 2    ? Pain Location Jaw   ? Pain Orientation Right;Left   ? Pain Descriptors / Indicators Constant   ? Pain Type Chronic pain   ? ?  ?  ? ?  ? ? ? ? ? ? ? ? ? ? ? ? ? ? ? ? ? ? ? ? [place] Adult PT Treatment/Exercise - 02/01/22 0001   ? ?  ?  Neuro Re-ed   ? Neuro Re-ed Details  R/L step back over cone, pick back up- no dizziness; STS with EC without UE support 10x, STS on foam with EO without UE support 10x- c/o dizziness up to 2/10   ?  ? Exercises  ? Exercises Neck   ?  ? Neck Exercises: Stretches  ? Upper Trapezius Stretch Right;Left;30 seconds   ? Upper Trapezius Stretch Limitations with strap assist   ? Other Neck Stretches R/L scalene stretch with strap assist 30" each   ? ?  ?  ? ?  ? ? Vestibular Treatment/Exercise - 02/01/22 0001   ? ?  ? Vestibular Treatment/Exercise  ? Habituation Exercises --   sitting R/L anterior habituation wit EO and EC- up to 2/10 dizziness on L with EC; trialed wall pushups with good tolerance  ?  ? Laruth Bouchard Daroff  ? Number of Reps  4   ? Symptom Description  0/10 on L, 2/10 on R with EO,3-4/10 R and L with EC   ?  ? X1 Viewing Horizontal  ? Foot Position standing on foam   ?  Reps --   30"  ? Comments imbalance, no dizziness   ?  ? X1 Viewing Vertical  ? Foot Position standing on foam   ? Reps --   30"  ? Comments no dizziness   ? ?  ?  ? ?  ? ? ? ? ? ? ? ? ? PT Education - 02/01/22 0931   ? ? Education Details update/review of HEP-Access Code: [email or web address]; edu on walking for fitness and self care   ? Person(s) Educated Patient   ? Methods Explanation;Tactile cues;Verbal cues;Handout;Demonstration   ? Comprehension Verbalized understanding;Returned demonstration   ? ?  ?  ? ?  ? ? ? PT Short Term Goals - 01/12/22 0929   ? ?  ? PT SHORT TERM GOAL #1  ? Title Patient to be independent with initial HEP.   ? Time 3   ? Period Weeks   ? Status Achieved   ? Target Date 01/26/22   ? ?  ?  ? ?  ? ? ? ? PT Long Term Goals - 01/10/22 1029   ? ?  ? PT LONG TERM GOAL #1  ? Title Patient to be independent with advanced HEP.   ? Time 6   ? Period Weeks   ? Status On-going   ? Target Date 02/16/22   ?  ? PT LONG TERM GOAL #2  ? Title Patient to demonstrate negative R DH.   ? Time 6   ? Period Weeks   ? Status On-going   ? Target Date 02/16/22   ?  ? PT LONG TERM GOAL #3  ? Title Patient will report 0/10 dizziness with bed mobility.   ? Time 6   ? Period Weeks   ? Status On-going   ? Target Date 02/16/22   ?  ? PT LONG TERM GOAL #4  ? Title Patient to report 0/10 dizziness with standing vertical and horizontal VOR for 30 seconds.   ? Time 6   ? Period Weeks   ? Status On-going   ? Target Date 02/16/22   ?  ? PT LONG TERM GOAL #5  ? Title Patient to report 70% improvement in dizziness.   ? Time 6   ? Period Weeks   ? Status On-going   ? Target Date 02/16/22   ? ?  ?  ? ?  ? ? ? ? ? ? ? ?  Plan - 02/01/22 0932   ? ? Clinical Impression Statement Patient arrived to session with report of improved dizziness since last session when STM was performed, but still notes some aggravation of dizziness with bed mobility. Continued to educate patient on importance of walking for fitness and self care. Cervical  sidebending AROM limited upon observation, thus initiated gentle UT and scalene stretching with strap assist for maximal stretch and ideal positioning. Forward bending habituation activities brought on minimal symptoms, as did [employer] with EO. More symptoms with Nestor Lewandowsky [facility], thus encouraged patient to perform this exercise with EC at home. Improved ability to perform STS with balance and sensory challenges today. Updated HEP for max benefit. Patient reported understanding and without complaints at end of session.   ? Comorbidities anxiety, depression, impaired hearing, panic disoder, shoulder surgery   ? PT Frequency 2x / week   ? PT Duration 6 weeks   ? PT Treatment/Interventions ADLs/Self Care Home Management;Canalith Repostioning;Electrical Stimulation;Moist Heat;Gait training;Stair training;Functional mobility training;Therapeutic activities;Therapeutic exercise;Balance training;Neuromuscular re-education;Manual techniques;Patient/family education;Passive range of motion;Dry needling;Vestibular;Taping   ? PT Next Visit Plan progress VOR and habituation; increase functional activity potentially adding walking and general conditioning; ask about walking more at home (and trying elliptical if he felt safe standing on it).  Add neck stretching and head motions to patient tolerance/ manual as needed.   ? Consulted and Agree with Plan of Care Patient   ? ?  ?  ? ?  ? ? ?Patient will benefit from skilled therapeutic intervention in order to improve the following deficits and impairments:  Dizziness, Decreased activity tolerance, Decreased balance ? ?Visit Diagnosis: ?Dizziness and giddiness ? ? ? ? ?Problem List ?Patient Active Problem List  ? Diagnosis Date Noted  ? Vertigo, intermittent 09/12/2020  ? Lactic acidemia 09/12/2020  ? Elevated ferritin 05/18/2020  ? Family history of hemochromatosis 05/18/2020  ? Family history of Huntington's disease 05/18/2020  ? Seasonal allergic rhinitis due to pollen  02/27/2019  ? Depression with anxiety 02/27/2019  ? Congenital hearing loss of both ears 05/23/2016  ? Hydrocele in adult 05/12/2016  ? ? ?Janene Harvey, PT, DPT ?02/01/22 9:33 AM ? ? ?[place] ?[place]

## 2022-02-01 NOTE — Patient Instructions (Signed)
Access Code: [email or web address] ?URL: [email or web address] ?Date: 02/01/2022 ?Prepared by: [facility] ? ?Program Notes ?perform at a speed to reach 3-4/10 dizziness ? ?Exercises ?- Standing Gaze Stabilization with Head Nod  - 2 x daily - 7 x weekly - 1 sets - 2 reps - 30 seconds hold ?- Standing Gaze Stabilization with Head Rotation  - 2 x daily - 7 x weekly - 1 sets - 2 reps - 30 seconds hold ?- VOR Cancellation  - 2 x daily - 7 x weekly - 1 sets - 1 reps - 30 seconds hold ?- Sit to Stand with Eyes Closed  - 1 x daily - 5 x weekly - 2 sets - 5 reps ?- Standing Balance with Eyes Closed on Foam  - 2 x daily - 7 x weekly - 1 sets - 3 reps - 20 seconds hold ?- Brandt-Daroff Vestibular Exercise  - 1 x daily - 5 x weekly - 2 sets - 3-5 reps ?- Rolling rightleft sides for vestibular habituation  - 2 x daily - 7 x weekly - 1 sets - 3-5 reps ?- Seated Scalene Stretch with Towel  - 1 x daily - 5 x weekly - 2 sets - 30 sec hold ?- Seated Upper Trapezius Stretch  - 1 x daily - 5 x weekly - 2 sets - 30 sec hold ?

## 2022-02-03 ENCOUNTER — Encounter: Payer: Self-pay | Admitting: Physical Therapy

## 2022-02-03 ENCOUNTER — Ambulatory Visit: Payer: 59 | Admitting: Physical Therapy

## 2022-02-03 DIAGNOSIS — R42 Dizziness and giddiness: Secondary | ICD-10-CM | POA: Diagnosis not present

## 2022-02-03 NOTE — Therapy (Signed)
[place] ?[facility] ?[address], [address] ?[place], [place], [address] ?Phone: [phone]   Fax:  [phone] ? ?Physical Therapy Treatment ? ?Patient Details  ?Name: Chris Robertson ?MRN: [id number] ?Date of Birth: 12/03/1970 ?Referring Provider (PT): Inda Coke, [place] ? ? ?Encounter Date: 02/03/2022 ? ? PT End of Session - 02/03/22 0933   ? ? Visit Number 8   ? Number of Visits 13   ? Date for PT Re-Evaluation 02/16/22   ? Authorization Type [facility]   ? Authorization - Visit Number 8   ? Authorization - Number of Visits 25   ? PT Start Time 0848   ? PT Stop Time [id number]   ? PT Time Calculation (min) 44 min   ? Equipment Utilized During Treatment Gait belt   ? Activity Tolerance Patient tolerated treatment well   ? Behavior During Therapy [facility] for tasks assessed/performed   ? ?  ?  ? ?  ? ? ?Past Medical History:  ?Diagnosis Date  ? Anxiety   ? Chicken pox   ? Depression   ? DOE (dyspnea on exertion) 10/11/2018  ? Rosanna Randy disease   ? Hearing impaired   ? age 51  ? Heart murmur   ? since childhood  ? Kidney stone   ? Mild mitral valve prolapse 2013  ? Mild MVP with mild MR by echo in 2013, read as trivial in 2019.  ? Near syncope 10/11/2018  ? Panic disorder 2015  ? With agoraphobia  ? Rapid heart beat 10/11/2018  ? Spermatocele 08/2012  ? hyrocele bilateral and spermatocele left; PSA normal 2013-2016  ? Thrombocytopenia ([place])   ? mild, chronic  ? Vitamin D deficiency   ? ? ?Past Surgical History:  ?Procedure Laterality Date  ? CHOLECYSTECTOMY  2007  ? [place]  ? SHOULDER SURGERY  1997  ? TOOTH EXTRACTION    ? TRANSTHORACIC ECHOCARDIOGRAM  10/03/2018  ? Normal LV size and function.  EF 55 to 60%.  No regional wall motion normality.  Trivial late mitral prolapse with trivial regurgitation.  Also trivial AI, TR, PR.  ? ? ?There were no vitals filed for this visit. ? ? Subjective Assessment - 02/03/22 0848   ? ? Subjective Dizziness hit him this AM when repositioning in bed  when turning his head down. Able to resolve within 15 minutes after performing brandt daroff and neck stretches.   ? Pertinent History anxiety, depression, impaired hearing, panic disoder, shoulder surgery   ? Diagnostic tests 09/18/21 brain MRI:  No acute intracranial abnormality.  Face MRI reported separately. Mild for age nonspecific cerebral white matter signal changes. 09/18/21 face MRI: Constellation suggests active Neuritis secondary to dental or periodontal disease. And mild or early mandible osteomyelitis is  difficult to exclude.   ? Patient Stated Goals improve dizziness   ? Currently in Pain? Yes   ? Pain Score 2    ? Pain Location Jaw   ? Pain Orientation Right;Left   ? Pain Descriptors / Indicators Constant   ? Pain Type Chronic pain   ? ?  ?  ? ?  ? ? ? ? ? ? ? ? ? ? ? ? ? ? ? ? ? ? ? ? [place] Adult PT Treatment/Exercise - 02/03/22 0001   ? ?  ? Neuro Re-ed   ? Neuro Re-ed Details  R/L fwd/back stepping + head nods/turns to targets with/without UE support with good stabiliy 10x; R/L open book  stretch with visual tracking and cervical ROM; quadruped alt arm raise and thread the needle with visual tracking- no symptoms   2-3/10 dizziness with head turns  ? ?  ?  ? ?  ? ? ? ? ? ? Balance Exercises - 02/03/22 0001   ? ?  ? Balance Exercises: Standing  ? Wall Bumps Shoulder;Hip;Eyes opened;Eyes closed;5 reps   c/o some dizziness with EC  ? Turning Right;Left;Limitations   ? Turning Limitations quick 1/2 turns to targets, then with additional toe taps on cone   c/o 1-2/10 dizziness  ? Other Standing Exercises Comments ant/pos wt shifts on foam EC, EC while weaning UE support   hesitancy in posterior direction  ? ?  ?  ? ?  ? ? ? ? ? PT Education - 02/03/22 0932   ? ? Education Details answered patient's questions on BPPV and inner ear anatomy   ? Person(s) Educated Patient   ? Methods Explanation;Demonstration;Tactile cues;Verbal cues   ? Comprehension Verbalized understanding   ? ?  ?  ? ?  ? ? ? PT Short  Term Goals - 01/12/22 0929   ? ?  ? PT SHORT TERM GOAL #1  ? Title Patient to be independent with initial HEP.   ? Time 3   ? Period Weeks   ? Status Achieved   ? Target Date 01/26/22   ? ?  ?  ? ?  ? ? ? ? PT Long Term Goals - 01/10/22 1029   ? ?  ? PT LONG TERM GOAL #1  ? Title Patient to be independent with advanced HEP.   ? Time 6   ? Period Weeks   ? Status On-going   ? Target Date 02/16/22   ?  ? PT LONG TERM GOAL #2  ? Title Patient to demonstrate negative R DH.   ? Time 6   ? Period Weeks   ? Status On-going   ? Target Date 02/16/22   ?  ? PT LONG TERM GOAL #3  ? Title Patient will report 0/10 dizziness with bed mobility.   ? Time 6   ? Period Weeks   ? Status On-going   ? Target Date 02/16/22   ?  ? PT LONG TERM GOAL #4  ? Title Patient to report 0/10 dizziness with standing vertical and horizontal VOR for 30 seconds.   ? Time 6   ? Period Weeks   ? Status On-going   ? Target Date 02/16/22   ?  ? PT LONG TERM GOAL #5  ? Title Patient to report 70% improvement in dizziness.   ? Time 6   ? Period Weeks   ? Status On-going   ? Target Date 02/16/22   ? ?  ?  ? ?  ? ? ? ? ? ? ? ? Plan - 02/03/22 0933   ? ? Clinical Impression Statement Patient arrived to session with report of remaining dizziness aggravated by bed mobility but improving ability to self-relieve symptoms with his exercises. Weight shifts demonstrated some hesitancy in posterior direction. Patient also with c/o some dizziness with EC during wall bumps. Very good stability with stepping with head movements to targets; patient did report mild dizziness with head turns rather than nods. Able to perform quick head turns to target with good stability and mild dizziness. Overall it appears that patient is still aggravated by activities with EC, thus plan to continue addressing this in future sessions.   ? Comorbidities  anxiety, depression, impaired hearing, panic disoder, shoulder surgery   ? PT Frequency 2x / week   ? PT Duration 6 weeks   ? PT  Treatment/Interventions ADLs/Self Care Home Management;Canalith Repostioning;Electrical Stimulation;Moist Heat;Gait training;Stair training;Functional mobility training;Therapeutic activities;Therapeutic exercise;Balance training;Neuromuscular re-education;Manual techniques;Patient/family education;Passive range of motion;Dry needling;Vestibular;Taping   ? PT Next Visit Plan progress VOR and habituation; increase functional activity potentially adding walking and general conditioning; ask about walking more at home (and trying elliptical if he felt safe standing on it).  Add neck stretching and head motions to patient tolerance/ manual as needed.   ? Consulted and Agree with Plan of Care Patient   ? ?  ?  ? ?  ? ? ?Patient will benefit from skilled therapeutic intervention in order to improve the following deficits and impairments:  Dizziness, Decreased activity tolerance, Decreased balance ? ?Visit Diagnosis: ?Dizziness and giddiness ? ? ? ? ?Problem List ?Patient Active Problem List  ? Diagnosis Date Noted  ? Vertigo, intermittent 09/12/2020  ? Lactic acidemia 09/12/2020  ? Elevated ferritin 05/18/2020  ? Family history of hemochromatosis 05/18/2020  ? Family history of Huntington's disease 05/18/2020  ? Seasonal allergic rhinitis due to pollen 02/27/2019  ? Depression with anxiety 02/27/2019  ? Congenital hearing loss of both ears 05/23/2016  ? Hydrocele in adult 05/12/2016  ? ? ?Janene Harvey, PT, DPT ?02/03/22 12:22 PM ? ? ?[place] ?[facility] ?[address], [address] ?[place], [place], [address] ?Phone: [phone]   Fax:  [phone] ? ?Name: Kairon Shock ?MRN: [id number] ?Date of Birth: Sep 30, 1971 ? ? ? ?

## 2022-02-06 ENCOUNTER — Encounter: Payer: Self-pay | Admitting: Physician Assistant

## 2022-02-06 ENCOUNTER — Encounter: Payer: Self-pay | Admitting: Physical Therapy

## 2022-02-06 ENCOUNTER — Ambulatory Visit: Payer: 59 | Admitting: Physical Therapy

## 2022-02-06 DIAGNOSIS — R42 Dizziness and giddiness: Secondary | ICD-10-CM

## 2022-02-06 NOTE — Therapy (Signed)
[place] [facility] ?[address], [address] ?[place], [place], [address] ?Phone: [phone]   Fax:  [phone] ? ?Physical Therapy Treatment ? ?Patient Details  ?Name: Chris Robertson ?MRN: [id number] ?Date of Birth: [birth year] ?Referring Provider (PT): Inda Coke, [place] ? ? ?Encounter Date: 02/06/2022 ? ? PT End of Session - 02/06/22 0921   ? ? Visit Number 9   ? Number of Visits 13   ? Date for PT Re-Evaluation 02/16/22   ? Authorization Type [facility]   ? Authorization - Visit Number 9   ? Authorization - Number of Visits 25   ? PT Start Time [phone]   ? PT Stop Time [phone]   ? PT Time Calculation (min) 39 min   ? Equipment Utilized During Treatment Gait belt   ? Activity Tolerance Patient tolerated treatment well   ? Behavior During Therapy [facility] for tasks assessed/performed   ? ?  ?  ? ?  ? ? ?Past Medical History:  ?Diagnosis Date  ? Anxiety   ? Chicken pox   ? Depression   ? DOE (dyspnea on exertion) 10/11/2018  ? Rosanna Randy disease   ? Hearing impaired   ? age 51  ? Heart murmur   ? since childhood  ? Kidney stone   ? Mild mitral valve prolapse 2013  ? Mild MVP with mild MR by echo in 2013, read as trivial in 2019.  ? Near syncope 10/11/2018  ? Panic disorder 2015  ? With agoraphobia  ? Rapid heart beat 10/11/2018  ? Spermatocele 08/2012  ? hyrocele bilateral and spermatocele left; PSA normal 2013-2016  ? Thrombocytopenia ([place])   ? mild, chronic  ? Vitamin D deficiency   ? ? ?Past Surgical History:  ?Procedure Laterality Date  ? CHOLECYSTECTOMY  2007  ? [place]  ? SHOULDER SURGERY  1997  ? TOOTH EXTRACTION    ? TRANSTHORACIC ECHOCARDIOGRAM  10/03/2018  ? Normal LV size and function.  EF 55 to 60%.  No regional wall motion normality.  Trivial late mitral prolapse with trivial regurgitation.  Also trivial AI, TR, PR.  ? ? ?There were no vitals filed for this visit. ? ? Subjective Assessment - 02/06/22 0840   ? ? Subjective Didn't have as many bursts of dizziness last  weekend. "Slept much better last night than I have in the last few weeks."   ? Pertinent History anxiety, depression, impaired hearing, panic disoder, shoulder surgery   ? Diagnostic tests 09/18/21 brain MRI:  No acute intracranial abnormality.  Face MRI reported separately. Mild for age nonspecific cerebral white matter signal changes. 09/18/21 face MRI: Constellation suggests active Neuritis secondary to dental or periodontal disease. And mild or early mandible osteomyelitis is  difficult to exclude.   ? Patient Stated Goals improve dizziness   ? Currently in Pain? No/denies   ? ?  ?  ? ?  ? ? ? ? ? ? ? ? ? ? ? ? ? ? ? ? ? ? ? ? [place] Adult PT Treatment/Exercise - 02/06/22 0001   ? ?  ? Neuro Re-ed   ? Neuro Re-ed Details  STS on foam + gaze focused 10x, STS on foam + EC- 5x with UE support, 5x without; RDL with 1 UE support on counter 10x- no dizziness   ? ?  ?  ? ?  ? ? Vestibular Treatment/Exercise - 02/06/22 0001   ? ?  ? Laruth Bouchard Daroff  ? Number of  Reps  10   ? Symptom Description  c/o 1-2/10 dizziness on L, 0/10 dizziness on R with EC; last 2 reps on each side at quicker pace   ?  ? Eye/Head Exercise Horizontal  ? Foot Position standing, romberg   ? Comments EC + head turns/nods 30" each   no dizziness, mild-mod unsteadiness in [place]  ? ?  ?  ? ?  ? ? ? ? ? Balance Exercises - 02/06/22 0001   ? ?  ? Balance Exercises: Standing  ? Wall Bumps Shoulder;Hip;Eyes opened;Eyes closed;5 reps;Limitations   ? Wall Bumps Limitations on foam; 12 reps total   good tolerance; occasional sway  ? Turning Limitations slow 1/2 turns with EC 2x10   mild dizziness and imbalance  ? ?  ?  ? ?  ? ? ? ? ? PT Education - 02/06/22 0920   ? ? Education Details update to HEP- Access Code: [email or web address]   ? Person(s) Educated Patient   ? Methods Explanation;Demonstration;Tactile cues;Verbal cues;Handout   ? Comprehension Returned demonstration;Verbalized understanding   ? ?  ?  ? ?  ? ? ? PT Short Term Goals - 01/12/22 0929   ? ?  ? PT  SHORT TERM GOAL #1  ? Title Patient to be independent with initial HEP.   ? Time 3   ? Period Weeks   ? Status Achieved   ? Target Date 01/26/22   ? ?  ?  ? ?  ? ? ? ? PT Long Term Goals - 01/10/22 1029   ? ?  ? PT LONG TERM GOAL #1  ? Title Patient to be independent with advanced HEP.   ? Time 6   ? Period Weeks   ? Status On-going   ? Target Date 02/16/22   ?  ? PT LONG TERM GOAL #2  ? Title Patient to demonstrate negative R DH.   ? Time 6   ? Period Weeks   ? Status On-going   ? Target Date 02/16/22   ?  ? PT LONG TERM GOAL #3  ? Title Patient will report 0/10 dizziness with bed mobility.   ? Time 6   ? Period Weeks   ? Status On-going   ? Target Date 02/16/22   ?  ? PT LONG TERM GOAL #4  ? Title Patient to report 0/10 dizziness with standing vertical and horizontal VOR for 30 seconds.   ? Time 6   ? Period Weeks   ? Status On-going   ? Target Date 02/16/22   ?  ? PT LONG TERM GOAL #5  ? Title Patient to report 70% improvement in dizziness.   ? Time 6   ? Period Weeks   ? Status On-going   ? Target Date 02/16/22   ? ?  ?  ? ?  ? ? ? ? ? ? ? ? Plan - 02/06/22 0923   ? ? Clinical Impression Statement Patient arrived to session with report of improved dizziness and sleeping tolerance since last session. Continued working on activities without visual fixation to challenge vestibular system. Able to progress STS on compliant surface without visual fixation with very good tolerance compared to previous sessions. Patient continues to demonstrate good tolerance for Nestor Lewandowsky with EC, challenged patient to increase pacing. Able to perform turns at slow pace with EC with mild instability and dizziness evident which improved with practice. Patient was able to tolerate today?s session without significant symptom aggravation. Will likely be  ready to wrap up next session d/t progress towards goals.   ? Comorbidities anxiety, depression, impaired hearing, panic disoder, shoulder surgery   ? PT Frequency 2x / week   ? PT  Duration 6 weeks   ? PT Treatment/Interventions ADLs/Self Care Home Management;Canalith Repostioning;Electrical Stimulation;Moist Heat;Gait training;Stair training;Functional mobility training;Therapeutic activities;Therapeutic exercise;Balance training;Neuromuscular re-education;Manual techniques;Patient/family education;Passive range of motion;Dry needling;Vestibular;Taping   ? PT Next Visit Plan progress VOR and habituation; increase functional activity potentially adding walking and general conditioning; ask about walking more at home (and trying elliptical if he felt safe standing on it).  Add neck stretching and head motions to patient tolerance/ manual as needed.   ? Consulted and Agree with Plan of Care Patient   ? ?  ?  ? ?  ? ? ?Patient will benefit from skilled therapeutic intervention in order to improve the following deficits and impairments:  Dizziness, Decreased activity tolerance, Decreased balance ? ?Visit Diagnosis: ?Dizziness and giddiness ? ? ? ? ?Problem List ?Patient Active Problem List  ? Diagnosis Date Noted  ? Vertigo, intermittent 09/12/2020  ? Lactic acidemia 09/12/2020  ? Elevated ferritin 05/18/2020  ? Family history of hemochromatosis 05/18/2020  ? Family history of Huntington's disease 05/18/2020  ? Seasonal allergic rhinitis due to pollen 02/27/2019  ? Depression with anxiety 02/27/2019  ? Congenital hearing loss of both ears 05/23/2016  ? Hydrocele in adult 05/12/2016  ? ? ?Janene Harvey, PT, DPT ?02/06/22 9:25 AM ? ? ?[facility] ?[address], [address] ?[place], [place], [address] ?Phone: [phone]   Fax:  [phone] ? ?Name: Chris Robertson ?MRN: [id number] ?Date of Birth: 1970-12-20 ? ? ? ?

## 2022-02-07 ENCOUNTER — Encounter: Payer: Self-pay | Admitting: Physician Assistant

## 2022-02-09 ENCOUNTER — Encounter: Payer: Self-pay | Admitting: Physician Assistant

## 2022-02-13 ENCOUNTER — Ambulatory Visit: Payer: 59 | Admitting: Physical Therapy

## 2022-02-13 ENCOUNTER — Encounter: Payer: Self-pay | Admitting: Physician Assistant

## 2022-02-14 ENCOUNTER — Encounter: Payer: Self-pay | Admitting: Physical Therapy

## 2022-02-14 ENCOUNTER — Ambulatory Visit: Payer: 59 | Admitting: Physical Therapy

## 2022-02-14 DIAGNOSIS — R42 Dizziness and giddiness: Secondary | ICD-10-CM | POA: Diagnosis not present

## 2022-02-14 NOTE — Patient Instructions (Signed)
Access Code: [email or web address] ?URL: [email or web address] ?Date: 02/14/2022 ?Prepared by: [facility] ? ?Program Notes ?perform at a speed to reach 3-4/10 dizziness ? ?Exercises ?- Standing Gaze Stabilization with Head Rotation  - 2 x daily - 7 x weekly - 1 sets - 2 reps - 30 seconds hold ?- VOR Cancellation  - 2 x daily - 7 x weekly - 1 sets - 1 reps - 30 seconds hold ?- Sit to Stand with Eyes Closed  - 1 x daily - 5 x weekly - 2 sets - 5 reps ?- Standing Balance with Eyes Closed on Foam  - 2 x daily - 7 x weekly - 1 sets - 3 reps - 20 seconds hold ?- Corner Balance Feet Apart: Eyes Closed With Head Turns  - 1 x daily - 5 x weekly - 2 sets - 30 sec hold ?- Brandt-Daroff Vestibular Exercise  - 1 x daily - 5 x weekly - 2 sets - 3-5 reps ?- Rolling rightleft sides for vestibular habituation  - 2 x daily - 7 x weekly - 1 sets - 3-5 reps ?- Seated [place] Stretch with Towel  - 1 x daily - 5 x weekly - 2 sets - 30 sec hold ?- Seated [place] Stretch  - 1 x daily - 5 x weekly - 2 sets - 30 sec hold ?

## 2022-02-14 NOTE — Therapy (Signed)
?[facility] ?[address], [address] ?[place], [place], [address] ?Phone: [phone]   Fax:  [phone] ? ?Physical Therapy Discharge Summary ? ?Patient Details  ?Name: Chris Robertson ?MRN: [id number] ?Date of Birth: Mar 06, 1971 ?Referring Provider (PT): Inda Coke, [place] ? ? ?Encounter Date: 02/14/2022 ? ? PT End of Session - 02/14/22 0840   ? ? Visit Number 10   ? Number of Visits 13   ? Date for PT Re-Evaluation 02/16/22   ? Authorization Type [facility]   ? Authorization - Visit Number 10   ? Authorization - Number of Visits 25   ? PT Start Time 0801   ? PT Stop Time 0837   ? PT Time Calculation (min) 36 min   ? Equipment Utilized During Treatment Gait belt   ? Activity Tolerance Patient tolerated treatment well   ? Behavior During Therapy [facility] for tasks assessed/performed   ? ?  ?  ? ?  ? ? ?Past Medical History:  ?Diagnosis Date  ? Anxiety   ? Chicken pox   ? Depression   ? DOE (dyspnea on exertion) 10/11/2018  ? Rosanna Randy disease   ? Hearing impaired   ? age 51  ? Heart murmur   ? since childhood  ? Kidney stone   ? Mild mitral valve prolapse 2013  ? Mild MVP with mild MR by echo in 2013, read as trivial in 2019.  ? Near syncope 10/11/2018  ? Panic disorder 2015  ? With agoraphobia  ? Rapid heart beat 10/11/2018  ? Spermatocele 08/2012  ? hyrocele bilateral and spermatocele left; PSA normal 2013-2016  ? Thrombocytopenia ([place])   ? mild, chronic  ? Vitamin D deficiency   ? ? ?Past Surgical History:  ?Procedure Laterality Date  ? CHOLECYSTECTOMY  2007  ? [place]  ? SHOULDER SURGERY  1997  ? TOOTH EXTRACTION    ? TRANSTHORACIC ECHOCARDIOGRAM  10/03/2018  ? Normal LV size and function.  EF 55 to 60%.  No regional wall motion normality.  Trivial late mitral prolapse with trivial regurgitation.  Also trivial AI, TR, PR.  ? ? ?There were no vitals filed for this visit. ? ? Subjective Assessment - 02/14/22 0753   ? ? Subjective Everything was fine until this AM, started  going into a spin while in bed. Everything in the neck feels tight this AM, however this is improved since before PT. Has been very active- walking more and moving his head more. Reports 80% improvement in dizziness.   ? Pertinent History anxiety, depression, impaired hearing, panic disoder, shoulder surgery   ? Diagnostic tests 09/18/21 brain MRI:  No acute intracranial abnormality.  Face MRI reported separately. Mild for age nonspecific cerebral white matter signal changes. 09/18/21 face MRI: Constellation suggests active Neuritis secondary to dental or periodontal disease. And mild or early mandible osteomyelitis is  difficult to exclude.   ? Patient Stated Goals improve dizziness   ? Currently in Pain? Yes   ? Pain Score 1    ? Pain Location Jaw   ? Pain Orientation Left   ? Pain Descriptors / Indicators Constant   ? Pain Type Chronic pain   ? ?  ?  ? ?  ? ? ? ? ? ? ? ? ? ? Vestibular Assessment - 02/14/22 0001   ? ?  ? Dix-Hallpike Right  ? Dix-Hallpike Right Duration 1-2 sec   ? Dix-Hallpike Right Symptoms Upbeat, right rotatory nystagmus  c/o mild dizziness briefly; 2nd trial asymptomatic however with low amplitude nystagmus throughout; 3rd trial negative  ? ?  ?  ? ?  ? ? ? ? ? ? ? ? ? ? ? [place] Adult PT Treatment/Exercise - 02/14/22 0001   ? ?  ? Neuro Re-ed   ? Neuro Re-ed Details  STS with EC 5x   ? ?  ?  ? ?  ? ? Vestibular Treatment/Exercise - 02/14/22 0001   ? ?  ? Vestibular Treatment/Exercise  ? Habituation Exercises Comment   simulating bed mobility including sit>R SL, R/L rolling, SL>sit- c/o 3-4/10 dizziness upon sitting up  ?  ?  EPLEY MANUEVER RIGHT  ? Number of Reps  1   ? Overall Response Improved Symptoms   ? Response Details  tolerated well   ?  ? X1 Viewing Horizontal  ? Foot Position standing   ? Reps --   30 sec  ? Comments c/o 2-3/10 dizziness   ?  ? X1 Viewing Vertical  ? Foot Position standing   ? Reps --   30 sec  ? Comments c/o 0/10 dizziness   ?  ? Eye/Head Exercise Horizontal  ?  Foot Position standing   ? Reps --   30 sec  ? Comments c/o 1/10 dizziness; VOR cancellation   ? ?  ?  ? ?  ? ? ? ? ? ? ? ? ? PT Education - 02/14/22 0839   ? ? Education Details update/consolidation of HEP-Access Code: [email or web address]; discussion on objective progress and remaining impairments   ? Person(s) Educated Patient   ? Methods Explanation;Demonstration;Tactile cues;Verbal cues;Handout   ? Comprehension Verbalized understanding;Returned demonstration   ? ?  ?  ? ?  ? ? ? PT Short Term Goals - 02/14/22 0841   ? ?  ? PT SHORT TERM GOAL #1  ? Title Patient to be independent with initial HEP.   ? Time 3   ? Period Weeks   ? Status Achieved   ? Target Date 01/26/22   ? ?  ?  ? ?  ? ? ? ? PT Long Term Goals - 02/14/22 0841   ? ?  ? PT LONG TERM GOAL #1  ? Title Patient to be independent with advanced HEP.   ? Time 6   ? Period Weeks   ? Status Achieved   ? Target Date 02/16/22   ?  ? PT LONG TERM GOAL #2  ? Title Patient to demonstrate negative R DH.   ? Time 6   ? Period Weeks   ? Status Achieved   ? Target Date 02/16/22   ?  ? PT LONG TERM GOAL #3  ? Title Patient will report 0/10 dizziness with bed mobility.   ? Time 6   ? Period Weeks   ? Status On-going   3-4/10 dizziness  ? Target Date 02/16/22   ?  ? PT LONG TERM GOAL #4  ? Title Patient to report 0/10 dizziness with standing vertical and horizontal VOR for 30 seconds.   ? Time 6   ? Period Weeks   ? Status On-going   2-3/10 in horizontal direction  ? Target Date 02/16/22   ?  ? PT LONG TERM GOAL #5  ? Title Patient to report 70% improvement in dizziness.   ? Time 6   ? Period Weeks   ? Status Achieved   ? Target Date 02/16/22   ? ?  ?  ? ?  ? ? ? ? ? ? ? ?  Plan - 02/14/22 0840   ? ? Clinical Impression Statement Patient arrived to session with report of improved dizziness until this AM, when he noticed sensation of spinning when rolling in bed. Also reporting remaining but improve neck stiffness today. Also notes increased activity levels recently. Overall  patient reports 80% improvement in dizziness since initial eval. Patient reports 3-4/10 dizziness with sidelying>sit today. Able to perform vertical and horizontal VOR with report of 0/10 and 2-3/10 dizziness, respectively. R DH was positive for mild and brief c/o dizziness today; treated with R Epley. Subsequent testing was negative. Reviewed and consolidated HEP for max benefit- patient reported understanding. At this time patient has demonstrated good progress towards goals, however some goals not met d/t patient with possible recurrence of BPPV which was treated today. Patient ready for D/C at this time.   ? Comorbidities anxiety, depression, impaired hearing, panic disoder, shoulder surgery   ? PT Frequency 2x / week   ? PT Duration 6 weeks   ? PT Treatment/Interventions ADLs/Self Care Home Management;Canalith Repostioning;Electrical Stimulation;Moist Heat;Gait training;Stair training;Functional mobility training;Therapeutic activities;Therapeutic exercise;Balance training;Neuromuscular re-education;Manual techniques;Patient/family education;Passive range of motion;Dry needling;Vestibular;Taping   ? PT Next Visit Plan DC at this time   ? Consulted and Agree with Plan of Care Patient   ? ?  ?  ? ?  ? ? ?Patient will benefit from skilled therapeutic intervention in order to improve the following deficits and impairments:  Dizziness, Decreased activity tolerance, Decreased balance ? ?Visit Diagnosis: ?Dizziness and giddiness ? ? ? ? ?Problem List ?Patient Active Problem List  ? Diagnosis Date Noted  ? Vertigo, intermittent 09/12/2020  ? Lactic acidemia 09/12/2020  ? Elevated ferritin 05/18/2020  ? Family history of hemochromatosis 05/18/2020  ? Family history of Huntington's disease 05/18/2020  ? Seasonal allergic rhinitis due to pollen 02/27/2019  ? Depression with anxiety 02/27/2019  ? Congenital hearing loss of both ears 05/23/2016  ? Hydrocele in adult 05/12/2016  ? ? ?PHYSICAL THERAPY DISCHARGE  SUMMARY ? ?Visits from Start of Care: 10 ? ?Current functional level related to goals / functional outcomes: ?See above clinical impression ?  ?Remaining deficits: ?Mild dizziness with bed mobility and VOR ?  ?Education

## 2022-03-10 ENCOUNTER — Encounter: Payer: Self-pay | Admitting: Physician Assistant

## 2022-03-10 ENCOUNTER — Other Ambulatory Visit: Payer: Self-pay | Admitting: Physician Assistant

## 2022-03-10 DIAGNOSIS — R42 Dizziness and giddiness: Secondary | ICD-10-CM

## 2022-03-10 NOTE — Telephone Encounter (Signed)
Please advise 

## 2022-03-22 NOTE — Therapy (Signed)
OUTPATIENT PHYSICAL THERAPY VESTIBULAR EVALUATION     Patient Name: Chris Robertson MRN: [id number] DOB:[birth year], 51 y.o., male Today's Date: 03/24/2022  PCP: Inda Coke, [place]  REFERRING PROVIDER: Inda Coke, PA    PT End of Session - 03/24/22 1019     Visit Number 1    Number of Visits 7    Date for PT Re-Evaluation 05/05/22    Authorization Type [facility]    Authorization - Visit Number 11    Authorization - Number of Visits 25    PT Start Time 3810    PT Stop Time 1751    PT Time Calculation (min) 38 min    Activity Tolerance Patient tolerated treatment well    Behavior During Therapy [facility] for tasks assessed/performed             Past Medical History:  Diagnosis Date   Anxiety    Chicken pox    Depression    DOE (dyspnea on exertion) 10/11/2018   Rosanna Randy disease    Hearing impaired    age 24   Heart murmur    since childhood   Kidney stone    Mild mitral valve prolapse 2013   Mild MVP with mild MR by echo in 2013, read as trivial in 2019.   Near syncope 10/11/2018   Panic disorder 2015   With agoraphobia   Rapid heart beat 10/11/2018   Spermatocele 08/2012   hyrocele bilateral and spermatocele left; PSA normal 2013-2016   Thrombocytopenia (HCC)    mild, chronic   Vitamin D deficiency    Past Surgical History:  Procedure Laterality Date   CHOLECYSTECTOMY  2007   [place]   TOOTH EXTRACTION     TRANSTHORACIC ECHOCARDIOGRAM  10/03/2018   Normal LV size and function.  EF 55 to 60%.  No regional wall motion normality.  Trivial late mitral prolapse with trivial regurgitation.  Also trivial AI, TR, PR.   Patient Active Problem List   Diagnosis Date Noted   Vertigo, intermittent 09/12/2020   Lactic acidemia 09/12/2020   Elevated ferritin 05/18/2020   Family history of hemochromatosis 05/18/2020   Family history of Huntington's disease 05/18/2020   Seasonal allergic rhinitis due to pollen 02/27/2019    Depression with anxiety 02/27/2019   Congenital hearing loss of both ears 05/23/2016   Hydrocele in adult 05/12/2016    ONSET DATE: ~03/10/22  REFERRING DIAG: R42 (ICD-10-CM) - Vertigo, intermittent   THERAPY DIAG:  Dizziness and giddiness  Cervicalgia  Rationale for Evaluation and Treatment Rehabilitation  SUBJECTIVE:   SUBJECTIVE STATEMENT: Had a relapse of vertigo 2 weeks ago while cleaning and bending over. Felt spinning and took Meclizine and tried leaning against the wall to help. That night, while rolling from his R to L side he felt heaviness in his head. Since then, has been working on his dizziness exercises from previous POC. Has felt "wobbly, bouncy." Episodes last minutes and worse with bending forward, turning, standing out of a chair. Better with Meclizine (takes 2x/day). Continues to have tinnitus; denies migraines. Reports continued tension in the muscles of his posterior neck.  Pt accompanied by: self  PERTINENT HISTORY: anxiety, depression, impaired hearing, panic disoder, shoulder surgery    PAIN:  Are you having pain? Yes: NPRS scale: 5/10 Pain location: B TMJ Pain description: sharp/dull Aggravating factors: constant Relieving factors: neck stretching  PRECAUTIONS: None  WEIGHT BEARING RESTRICTIONS No  FALLS: Has patient fallen in last 6  months? 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  PATIENT GOALS improve dizziness  OBJECTIVE:   DIAGNOSTIC FINDINGS: none recent  COGNITION: Overall cognitive status: Within functional limits for tasks assessed   SENSATION: WFL  POSTURE: rounded shoulders and forward head   Cervical ROM:    Active A/PROM (deg) eval  Flexion 53  Extension 50  Right lateral flexion 50  Left lateral flexion 50 *c/o mild dizziness  Right rotation 68  Left rotation 68 *c/o mild  dizziness  (Blank rows = not tested)    GAIT: Gait pattern: WFL Assistive device utilized: None Level of assistance: Complete Independence   VESTIBULAR ASSESSMENT   GENERAL OBSERVATION: head rotated slightly to L; patient wears bifocals but reports that he still has trouble seeing close-up     OCULOMOTOR EXAM:   Ocular Alignment:  L eye slightly elevated   Ocular ROM: No Limitations   Spontaneous Nystagmus: absent   Gaze-Induced Nystagmus: absent   Smooth Pursuits: saccades; 1 saccade in vertical direction   Saccades: intact   Convergence/Divergence: ~4 inches     VESTIBULAR - OCULAR REFLEX:    Slow VOR: Comment: c/o dizziness with horizontal VOR, intact vertical VOR   VOR Cancellation: Comment: intact; patient reporting "a little spaced out"   Head-Impulse Test: HIT Right: negative HIT Left: negative *patient reports sensation of heaviness in head and body     POSITIONAL TESTING:  *saccadic intrusions present throughout positional testing Right Roll Test: negative Left Roll Test: negative; c/o mild dizziness and heaviness in L side of head upon rolling back to supine Right Dix-Hallpike: negative Left Dix-Hallpike: negative; co mild dizziness and head pressure upon laying down and sitting up   VESTIBULAR TREATMENT:    PATIENT EDUCATION: Education details: prognosis, POC, review of HEP- highest priority exercises are bolded; edu on benefits of medication and CBT on chronic dizziness Person educated: Patient Education method: Explanation Education comprehension: verbalized understanding  Access Code: [email or web address] URL: [email or web address] Date: 03/24/2022 Prepared by: [facility]  Program Notes perform at a speed to reach 3-4/10 dizziness  Exercises - Standing Gaze Stabilization with Head Rotation  - 2 x daily - 7 x weekly - 1 sets - 2 reps - 30 seconds hold - VOR Cancellation  - 2 x daily - 7 x weekly - 1 sets -  1 reps - 30 seconds hold - Sit to Stand with Eyes Closed  - 1 x daily - 5 x weekly - 2 sets - 5 reps - Standing Balance with Eyes Closed on Foam  - 2 x daily - 7 x weekly - 1 sets - 3 reps - 20 seconds hold - Corner Balance Feet Apart: Eyes Closed With Head Turns  - 1 x daily - 5 x weekly - 2 sets - 30 sec hold - Brandt-Daroff Vestibular Exercise  - 1 x daily - 5 x weekly - 2 sets - 3-5 reps - Rolling rightleft sides for vestibular habituation  - 2 x daily - 7 x weekly - 1 sets - 3-5 reps - Seated Scalene Stretch with Towel  - 1 x daily - 5 x weekly - 2 sets - 30 sec hold - Seated Upper Trapezius Stretch  - 1 x daily - 5 x weekly - 2 sets - 30 sec hold   GOALS: Goals reviewed with patient? Yes  SHORT TERM GOALS: Target date: 04/14/2022  Patient to be independent with initial HEP. Baseline: HEP initiated Goal status: INITIAL    LONG TERM GOALS: Target date: 05/05/2022  Patient to be independent with advanced HEP. Baseline: Not yet initiated  Goal status: INITIAL  Patient to report 0/10 dizziness with standing horizontal VOR for 30 seconds. Baseline: Unable Goal status: INITIAL  Patient will report 0/10 dizziness with bed mobility.  Baseline: Symptomatic  Goal status: INITIAL  Patient will participate in 3x weekly exercise regimen for continued fitness/vestibular health.   Baseline: not initiated  Goal status: INITIAL    ASSESSMENT:  CLINICAL IMPRESSION:   Patient is a 51 y/o M presenting to OPPT with c/o acute on chronic dizziness for the past 2 weeks. Onset occurred when bending over, with another episode occurring that night while rolling in bed. Since then, dizziness has lasted minutes and worse with bending forward, turning, standing out of a chair. Continues to have tinnitus and posterior cervical muscle tightness; denies migraines. Patient today presenting with rounded shoulders and forward head posture, mild dizziness with L cervical rotation and sidebending,  corrective saccade with inferior smooth pursuit, convergence insufficiency, symptomatic VOR and VOR calcellation. Positional testing revealed motion sensitivity with L roll and L DH. Reviewed patient's POC previously issued by this PT and identified exercises that may be most helpful. Also educated on benefits of medication and CBT on chronic vertigo, particularly in patients with PPPD. Patient reported understanding. Would benefit from skilled PT services every 1-2 weeks for 6 weeks to address dizziness.      OBJECTIVE IMPAIRMENTS decreased activity tolerance, decreased balance, dizziness, increased muscle spasms, impaired flexibility, improper body mechanics, and pain.   ACTIVITY LIMITATIONS lifting, bending, sleeping, stairs, transfers, bed mobility, and reach over head  PARTICIPATION LIMITATIONS: cleaning, laundry, driving, shopping, community activity, and occupation  PERSONAL FACTORS Age, Behavior pattern, Past/current experiences, Profession, Time since onset of injury/illness/exacerbation, and 3+ comorbidities: anxiety, depression, impaired hearing, panic disoder, shoulder surgery   are also affecting patient's functional outcome.   REHAB POTENTIAL: Good  CLINICAL DECISION MAKING: Evolving/moderate complexity  EVALUATION COMPLEXITY: Moderate   PLAN: PT FREQUENCY: every 1-2 weeks  PT DURATION: 6 weeks  PLANNED INTERVENTIONS: Therapeutic exercises, Therapeutic activity, Neuromuscular re-education, Balance training, Gait training, Patient/Family education, Joint mobilization, Stair training, Vestibular training, Canalith repositioning, Dry Needling, Cryotherapy, Moist heat, Taping, Manual therapy, and Re-evaluation  PLAN FOR NEXT SESSION: progress habituation, address cervical soft tissue restriction and try cervical proprioception exercises   Janene Harvey, PT, DPT 03/24/22 10:35 AM

## 2022-03-24 ENCOUNTER — Encounter: Payer: Self-pay | Admitting: Physical Therapy

## 2022-03-24 ENCOUNTER — Ambulatory Visit: Payer: 59 | Attending: Physician Assistant | Admitting: Physical Therapy

## 2022-03-24 DIAGNOSIS — M542 Cervicalgia: Secondary | ICD-10-CM | POA: Diagnosis not present

## 2022-03-24 DIAGNOSIS — R42 Dizziness and giddiness: Secondary | ICD-10-CM | POA: Insufficient documentation

## 2022-03-31 NOTE — Therapy (Addendum)
OUTPATIENT PHYSICAL THERAPY VESTIBULAR TREATMENT     Patient Name: Chris Robertson MRN: [id number] DOB:1970-11-13, 51 y.o., male Today's Date: 04/03/2022  PCP: Inda Coke, [place]  REFERRING PROVIDER: Inda Coke, PA    PT End of Session - 04/03/22 1011     Visit Number 2    Number of Visits 7    Date for PT Re-Evaluation 05/05/22    Authorization Type [facility]    Authorization - Visit Number 12    Authorization - Number of Visits 25    PT Start Time 6222    PT Stop Time 1009    PT Time Calculation (min) 34 min    Activity Tolerance Patient tolerated treatment well    Behavior During Therapy [facility] for tasks assessed/performed              Past Medical History:  Diagnosis Date   Anxiety    Chicken pox    Depression    DOE (dyspnea on exertion) 10/11/2018   Rosanna Randy disease    Hearing impaired    age 87   Heart murmur    since childhood   Kidney stone    Mild mitral valve prolapse 2013   Mild MVP with mild MR by echo in 2013, read as trivial in 2019.   Near syncope 10/11/2018   Panic disorder 2015   With agoraphobia   Rapid heart beat 10/11/2018   Spermatocele 08/2012   hyrocele bilateral and spermatocele left; PSA normal 2013-2016   Thrombocytopenia (HCC)    mild, chronic   Vitamin D deficiency    Past Surgical History:  Procedure Laterality Date   CHOLECYSTECTOMY  2007   [place]   TOOTH EXTRACTION     TRANSTHORACIC ECHOCARDIOGRAM  10/03/2018   Normal LV size and function.  EF 55 to 60%.  No regional wall motion normality.  Trivial late mitral prolapse with trivial regurgitation.  Also trivial AI, TR, PR.   Patient Active Problem List   Diagnosis Date Noted   Vertigo, intermittent 09/12/2020   Lactic acidemia 09/12/2020   Elevated ferritin 05/18/2020   Family history of hemochromatosis 05/18/2020   Family history of Huntington's disease 05/18/2020   Seasonal allergic rhinitis due to pollen 02/27/2019    Depression with anxiety 02/27/2019   Congenital hearing loss of both ears 05/23/2016   Hydrocele in adult 05/12/2016    ONSET DATE: ~03/10/22  REFERRING DIAG: R42 (ICD-10-CM) - Vertigo, intermittent   THERAPY DIAG:  Dizziness and giddiness  Cervicalgia  Rationale for Evaluation and Treatment Rehabilitation  SUBJECTIVE:   SUBJECTIVE STATEMENT: Feeling much better, has not had any issues since. After the "positional maneuvers" were done last session he had some spinning but no problems since then. Getting a custom mouth piece for his TMJ. Has been able to get on the elliptical 2x for 5 min. Some remaining tension in the neck.  Pt accompanied by: self  PERTINENT HISTORY: anxiety, depression, impaired hearing, panic disoder, shoulder surgery    PAIN:  Are you having pain? Yes: NPRS scale: 5-6/10 Pain location: B TMJ Pain description: sharp/dull Aggravating factors: constant Relieving factors: neck stretching  PRECAUTIONS: None   PATIENT GOALS improve dizziness   OBJECTIVE:   TODAY'S TREATMENT: 04/03/22 Activity Comments  suboccipital stretch 30" each Gentle OP to tolerance; cues to avoid pulling into pain  cervical retraction 10x3" Cues/demo for proper form   extension SNAG 10x Cues/demo for proper form  R/L rotation SNAG 10x  Cues to avoid trunk rotation  Joint position sense training with foveal glasses R/L rotation, flexion, extension 5x  Pt  reporting good ease and no symptoms   Standing VOR horizontal 3x30"; last set in front of blinds Cues to reduce ROM and increase pace; no dizziness   PATIENT EDUCATION: Education details: edu on DN benefits, contraindications, side effects; provided article per patient's request on benefits of CBT and medication for PPPD; edu on importance of continued physical activity and provided suggestions on how to modify fitness activities if dizziness reoccurs  Person educated: Patient Education method: Explanation, Demonstration,  Tactile cues, Verbal cues, and Handouts Education comprehension: verbalized understanding   measures below were taken at time of initial evaluation unless otherwise specified:   DIAGNOSTIC FINDINGS: none recent  COGNITION: Overall cognitive status: Within functional limits for tasks assessed   SENSATION: WFL  POSTURE: rounded shoulders and forward head   Cervical ROM:    Active A/PROM (deg) eval  Flexion 53  Extension 50  Right lateral flexion 50  Left lateral flexion 50 *c/o mild dizziness  Right rotation 68  Left rotation 68 *c/o mild dizziness  (Blank rows = not tested)    GAIT: Gait pattern: WFL Assistive device utilized: None Level of assistance: Complete Independence   VESTIBULAR ASSESSMENT   GENERAL OBSERVATION: head rotated slightly to L; patient wears bifocals but reports that he still has trouble seeing close-up     OCULOMOTOR EXAM:   Ocular Alignment:  L eye slightly elevated   Ocular ROM: No Limitations   Spontaneous Nystagmus: absent   Gaze-Induced Nystagmus: absent   Smooth Pursuits: saccades; 1 saccade in vertical direction   Saccades: intact   Convergence/Divergence: ~4 inches     VESTIBULAR - OCULAR REFLEX:    Slow VOR: Comment: c/o dizziness with horizontal VOR, intact vertical VOR   VOR Cancellation: Comment: intact; patient reporting "a little spaced out"   Head-Impulse Test: HIT Right: negative HIT Left: negative *patient reports sensation of heaviness in head and body     POSITIONAL TESTING:  *saccadic intrusions present throughout positional testing Right Roll Test: negative Left Roll Test: negative; c/o mild dizziness and heaviness in L side of head upon rolling back to supine Right Dix-Hallpike: negative Left Dix-Hallpike: negative; co mild dizziness and head pressure upon laying down and sitting up   VESTIBULAR TREATMENT:    PATIENT EDUCATION: Education details: prognosis, POC, review of HEP- highest priority exercises  are bolded; edu on benefits of medication and CBT on chronic dizziness Person educated: Patient Education method: Explanation Education comprehension: verbalized understanding  Access Code: [email or web address] URL: [email or web address] Date: 03/24/2022 Prepared by: [facility]  Program Notes perform at a speed to reach 3-4/10 dizziness  Exercises - Standing Gaze Stabilization with Head Rotation  - 2 x daily - 7 x weekly - 1 sets - 2 reps - 30 seconds hold - VOR Cancellation  - 2 x daily - 7 x weekly - 1 sets - 1 reps - 30 seconds hold - Sit to Stand with Eyes Closed  - 1 x daily - 5 x weekly - 2 sets - 5 reps - Standing Balance with Eyes Closed on Foam  - 2 x daily - 7 x weekly - 1 sets - 3 reps - 20 seconds hold - Corner Balance Feet Apart: Eyes Closed With Head Turns  - 1 x daily - 5 x weekly - 2 sets - 30 sec hold - Brandt-Daroff Vestibular Exercise  -  1 x daily - 5 x weekly - 2 sets - 3-5 reps - Rolling rightleft sides for vestibular habituation  - 2 x daily - 7 x weekly - 1 sets - 3-5 reps - Seated Scalene Stretch with Towel  - 1 x daily - 5 x weekly - 2 sets - 30 sec hold - Seated Upper Trapezius Stretch  - 1 x daily - 5 x weekly - 2 sets - 30 sec hold   GOALS: Goals reviewed with patient? Yes  SHORT TERM GOALS: Target date: 04/14/2022  Patient to be independent with initial HEP. Baseline: HEP initiated Goal status: IN PROGRESS    LONG TERM GOALS: Target date: 05/05/2022  Patient to be independent with advanced HEP. Baseline: Not yet initiated  Goal status: IN PROGRESS  Patient to report 0/10 dizziness with standing horizontal VOR for 30 seconds. Baseline: Unable Goal status: IN PROGRESS  Patient will report 0/10 dizziness with bed mobility.  Baseline: Symptomatic  Goal status: IN PROGRESS  Patient will participate in 3x weekly exercise regimen for continued fitness/vestibular health.   Baseline: not initiated  Goal  status: IN PROGRESS    ASSESSMENT:  CLINICAL IMPRESSION:   Patient arrived to session with report of improvement in dizziness since last session. Initiated cervical postural strengthening and ROM activities to address neck tension. Patient tolerated these activities well. Also initiated joint position sense training using foveal glasses with patient demonstrating fairly quick fixation to target. Also demonstrating asymptomatic VOR exercises today, even with optokinetic challenges. Patient reported understanding of all edu provided today and without complaints at end of session.      OBJECTIVE IMPAIRMENTS decreased activity tolerance, decreased balance, dizziness, increased muscle spasms, impaired flexibility, improper body mechanics, and pain.   ACTIVITY LIMITATIONS lifting, bending, sleeping, stairs, transfers, bed mobility, and reach over head  PARTICIPATION LIMITATIONS: cleaning, laundry, driving, shopping, community activity, and occupation  PERSONAL FACTORS Age, Behavior pattern, Past/current experiences, Profession, Time since onset of injury/illness/exacerbation, and 3+ comorbidities: anxiety, depression, impaired hearing, panic disoder, shoulder surgery   are also affecting patient's functional outcome.   REHAB POTENTIAL: Good  CLINICAL DECISION MAKING: Evolving/moderate complexity  EVALUATION COMPLEXITY: Moderate   PLAN: PT FREQUENCY: every 1-2 weeks  PT DURATION: 6 weeks  PLANNED INTERVENTIONS: Therapeutic exercises, Therapeutic activity, Neuromuscular re-education, Balance training, Gait training, Patient/Family education, Joint mobilization, Stair training, Vestibular training, Canalith repositioning, Dry Needling, Cryotherapy, Moist heat, Taping, Manual therapy, and Re-evaluation  PLAN FOR NEXT SESSION: progress habituation, address cervical soft tissue restriction and cervical proprioception exercises   Janene Harvey, PT, DPT 04/03/22 10:12 AM   PHYSICAL  THERAPY DISCHARGE SUMMARY  Visits from Start of Care: 2  Current functional level related to goals / functional outcomes: Unable to assess; patient requested d/c    Remaining deficits: Unable to assess   Education / Equipment: HEP  Plan: Patient agrees to discharge.  Patient goals were not met. Patient is being discharged per his request.    Janene Harvey, PT, DPT 06/19/22 10:50 AM  [facility] at [facility] [place], [place], [place] [address] Phone # [phone] Fax # [phone]

## 2022-04-03 ENCOUNTER — Encounter: Payer: Self-pay | Admitting: Physical Therapy

## 2022-04-03 ENCOUNTER — Ambulatory Visit: Payer: 59 | Attending: Physician Assistant | Admitting: Physical Therapy

## 2022-04-03 DIAGNOSIS — M542 Cervicalgia: Secondary | ICD-10-CM | POA: Diagnosis not present

## 2022-04-03 DIAGNOSIS — R42 Dizziness and giddiness: Secondary | ICD-10-CM | POA: Insufficient documentation

## 2022-04-03 NOTE — Patient Instructions (Signed)

## 2022-04-12 ENCOUNTER — Ambulatory Visit (INDEPENDENT_AMBULATORY_CARE_PROVIDER_SITE_OTHER): Payer: 59 | Admitting: *Deleted

## 2022-04-12 DIAGNOSIS — Z23 Encounter for immunization: Secondary | ICD-10-CM | POA: Diagnosis not present

## 2022-04-12 NOTE — Progress Notes (Signed)
Patient present for #2 shingles vaccine  Given in rt arm  Patient tolerated well

## 2022-04-18 ENCOUNTER — Ambulatory Visit: Payer: 59 | Admitting: Physical Therapy

## 2022-05-15 ENCOUNTER — Telehealth: Payer: Self-pay | Admitting: Physician Assistant

## 2022-05-15 NOTE — Telephone Encounter (Signed)
Patient returned call. Requested Physical and to update on Vertigo recovery

## 2022-05-15 NOTE — Telephone Encounter (Signed)
LVM in regards to patient's appointment request for a physical. In the comments patient wrote "vertigo recovery" and I wanted to make sure this needed to be a physical and not an OV.

## 2022-05-30 NOTE — Progress Notes (Signed)
SCRIBE STATEMENT  Subjective:    Chris Robertson is a 51 y.o. male and is here for a comprehensive physical exam.  HPI  Health Maintenance Due  Topic Date Due   COVID-19 Vaccine (3 - Pfizer series) 02/06/2020   INFLUENZA VACCINE  05/30/2022    Acute Concerns:   Chronic Issues:   Health Maintenance: Immunizations -- *** Colonoscopy -- *** PSA --  Lab Results  Component Value Date   PSA 0.46 11/10/2015   Diet -- *** Sleep habits -- *** Exercise -- *** Weight -- @FLOWAMB (14)@  Weight history Wt Readings from Last 10 Encounters:  12/26/21 220 lb (99.8 kg)  11/07/21 222 lb (100.7 kg)  10/27/21 223 lb 6.4 oz (101.3 kg)  08/24/21 220 lb 6.4 oz (100 kg)  07/15/21 216 lb 6.1 oz (98.1 kg)  12/13/20 204 lb 8 oz (92.8 kg)  09/20/20 195 lb (88.5 kg)  05/18/20 194 lb (88 kg)  03/08/20 188 lb 2 oz (85.3 kg)  01/21/20 187 lb 8 oz (85 kg)   There is no height or weight on file to calculate BMI. Mood -- *** Tobacco use --  Tobacco Use: Low Risk  (04/03/2022)   Patient History    Smoking Tobacco Use: Never    Smokeless Tobacco Use: Never    Passive Exposure: Not on file    Alcohol use ---  reports that he does not currently use alcohol.      07/15/2021    9:07 AM  Depression screen PHQ 2/9  Decreased Interest 2  Down, Depressed, Hopeless 1  PHQ - 2 Score 3  Altered sleeping 2  Tired, decreased energy 2  Change in appetite 0  Feeling bad or failure about yourself  0  Trouble concentrating 0  Moving slowly or fidgety/restless 0  Suicidal thoughts 0  PHQ-9 Score 7  Difficult doing work/chores Somewhat difficult     Other providers/specialists: Patient Care Team: 4/9, Jarold Motto as PCP - General (Physician Assistant)   PMHx, SurgHx, SocialHx, Medications, and Allergies were reviewed in the Visit Navigator and updated as appropriate.   Past Medical History:  Diagnosis Date   Anxiety    Chicken pox    Depression    DOE (dyspnea on exertion) 10/11/2018    10/13/2018 disease    Hearing impaired    age 51   Heart murmur    since childhood   Kidney stone    Mild mitral valve prolapse 2013   Mild MVP with mild MR by echo in 2013, read as trivial in 2019.   Near syncope 10/11/2018   Panic disorder 2015   With agoraphobia   Rapid heart beat 10/11/2018   Spermatocele 08/2012   hyrocele bilateral and spermatocele left; PSA normal 2013-2016   Thrombocytopenia (HCC)    mild, chronic   Vitamin D deficiency      Past Surgical History:  Procedure Laterality Date   CHOLECYSTECTOMY  2007   PECTUS CARNATUM REPAIR  1989   SHOULDER SURGERY  1997   TOOTH EXTRACTION     TRANSTHORACIC ECHOCARDIOGRAM  10/03/2018   Normal LV size and function.  EF 55 to 60%.  No regional wall motion normality.  Trivial late mitral prolapse with trivial regurgitation.  Also trivial AI, TR, PR.     Family History  Problem Relation Age of Onset   Mental illness Mother    Huntington's disease Mother    Depression Mother    Mental illness Father    Hypertension Father  Diabetes Father    Hyperlipidemia Father    Depression Father    Obesity Father    Mental illness Sister    Depression Sister    Mental illness Brother    Thyroid cancer Maternal Aunt    Mental illness Maternal Aunt    Mental illness Maternal Uncle    Mental illness Paternal Aunt    Mental illness Paternal Uncle    Mental illness Maternal Grandmother    Pancreatic cancer Maternal Grandfather    Mental illness Maternal Grandfather    Mental illness Paternal Grandmother    Mental illness Paternal Grandfather     Social History   Tobacco Use   Smoking status: Never   Smokeless tobacco: Never  Vaping Use   Vaping Use: Never used  Substance Use Topics   Alcohol use: Not Currently    Comment: Sober since September 2019   Drug use: No    Review of Systems:   ROS Negative unless otherwise specified per HPI.   Objective:   There were no vitals filed for this visit. There is no  height or weight on file to calculate BMI.  General Appearance:  Alert, cooperative, no distress, appears stated age  Head:  Normocephalic, without obvious abnormality, atraumatic  Eyes:  PERRL, conjunctiva/corneas clear, EOM's intact, fundi benign, both eyes       Ears:  Normal TM's and external ear canals, both ears  Nose: Nares normal, septum midline, mucosa normal, no drainage    or sinus tenderness  Throat: Lips, mucosa, and tongue normal; teeth and gums normal  Neck: Supple, symmetrical, trachea midline, no adenopathy; thyroid:  No enlargement/tenderness/nodules; no carotit bruit or JVD  Back:   Symmetric, no curvature, ROM normal, no CVA tenderness  Lungs:   Clear to auscultation bilaterally, respirations unlabored  Chest wall:  No tenderness or deformity  Heart:  Regular rate and rhythm, S1 and S2 normal, no murmur, rub   or gallop  Abdomen:   Soft, non-tender, bowel sounds active all four quadrants, no masses, no organomegaly  Extremities: Extremities normal, atraumatic, no cyanosis or edema  Prostate: Not done.   Skin: Skin color, texture, turgor normal, no rashes or lesions  Lymph nodes: Cervical, supraclavicular, and axillary nodes normal  Neurologic: CNII-XII grossly intact. Normal strength, sensation and reflexes throughout    Assessment/Plan:   @DIAGLIST @    Patient Counseling: [x]   Nutrition: Stressed importance of moderation in sodium/caffeine intake, saturated fat and cholesterol, caloric balance, sufficient intake of fresh fruits, vegetables, and fiber.  [x]   Stressed the importance of regular exercise.   []   Substance Abuse: Discussed cessation/primary prevention of tobacco, alcohol, or other drug use; driving or other dangerous activities under the influence; availability of treatment for abuse.   [x]   Injury prevention: Discussed safety belts, safety helmets, smoke detector, smoking near bedding or upholstery.   []   Sexuality: Discussed sexually transmitted  diseases, partner selection, use of condoms, avoidance of unintended pregnancy  and contraceptive alternatives.   [x]   Dental health: Discussed importance of regular tooth brushing, flossing, and dental visits.  [x]   Health maintenance and immunizations reviewed. Please refer to Health maintenance section.     I,Savera Zaman,acting as a for , PA.,have documented all relevant documentation on the behalf of , PA,as directed by  , PA while in the presence of , .   ***  , PA-C [facility]

## 2022-05-31 ENCOUNTER — Encounter: Payer: Self-pay | Admitting: Physician Assistant

## 2022-05-31 ENCOUNTER — Ambulatory Visit (INDEPENDENT_AMBULATORY_CARE_PROVIDER_SITE_OTHER): Payer: 59 | Admitting: Physician Assistant

## 2022-05-31 VITALS — BP 120/80 | HR 67 | Temp 97.5°F | Ht 74.0 in | Wt 226.5 lb

## 2022-05-31 DIAGNOSIS — R42 Dizziness and giddiness: Secondary | ICD-10-CM

## 2022-05-31 DIAGNOSIS — E663 Overweight: Secondary | ICD-10-CM

## 2022-05-31 DIAGNOSIS — Z0001 Encounter for general adult medical examination with abnormal findings: Secondary | ICD-10-CM

## 2022-05-31 DIAGNOSIS — R6884 Jaw pain: Secondary | ICD-10-CM

## 2022-05-31 DIAGNOSIS — Z0000 Encounter for general adult medical examination without abnormal findings: Secondary | ICD-10-CM

## 2022-05-31 LAB — CBC WITH DIFFERENTIAL/PLATELET
Basophils Absolute: 0 10*3/uL (ref 0.0–0.1)
Basophils Relative: 0.4 % (ref 0.0–3.0)
Eosinophils Absolute: 0.1 10*3/uL (ref 0.0–0.7)
Eosinophils Relative: 0.9 % (ref 0.0–5.0)
HCT: 45.9 % (ref 39.0–52.0)
Hemoglobin: 15.8 g/dL (ref 13.0–17.0)
Lymphocytes Relative: 18.2 % (ref 12.0–46.0)
Lymphs Abs: 1.3 10*3/uL (ref 0.7–4.0)
MCHC: 34.4 g/dL (ref 30.0–36.0)
MCV: 92.1 fl (ref 78.0–100.0)
Monocytes Absolute: 0.6 10*3/uL (ref 0.1–1.0)
Monocytes Relative: 8.4 % (ref 3.0–12.0)
Neutro Abs: 5.2 10*3/uL (ref 1.4–7.7)
Neutrophils Relative %: 72.1 % (ref 43.0–77.0)
Platelets: 185 10*3/uL (ref 150.0–400.0)
RBC: 4.98 Mil/uL (ref 4.22–5.81)
RDW: 13.4 % (ref 11.5–15.5)
WBC: 7.2 10*3/uL (ref 4.0–10.5)

## 2022-05-31 LAB — COMPREHENSIVE METABOLIC PANEL
ALT: 45 U/L (ref 0–53)
AST: 39 U/L — ABNORMAL HIGH (ref 0–37)
Albumin: 4.5 g/dL (ref 3.5–5.2)
Alkaline Phosphatase: 104 U/L (ref 39–117)
BUN: 17 mg/dL (ref 6–23)
CO2: 28 mEq/L (ref 19–32)
Calcium: 9.6 mg/dL (ref 8.4–10.5)
Chloride: 103 mEq/L (ref 96–112)
Creatinine, Ser: 1.03 mg/dL (ref 0.40–1.50)
GFR: 84.48 mL/min (ref >60.00)
Glucose, Bld: 97 mg/dL (ref 70–99)
Potassium: 4.2 mEq/L (ref 3.5–5.1)
Sodium: 139 mEq/L (ref 135–145)
Total Bilirubin: 2 mg/dL — ABNORMAL HIGH (ref 0.2–1.2)
Total Protein: 7 g/dL (ref 6.0–8.3)

## 2022-05-31 LAB — LIPID PANEL
Cholesterol: 173 mg/dL (ref 0–200)
HDL: 39.7 mg/dL (ref >39.00)
LDL Cholesterol: 119 mg/dL — ABNORMAL HIGH (ref 0–99)
NonHDL: 132.94
Total CHOL/HDL Ratio: 4
Triglycerides: 71 mg/dL (ref 0.0–149.0)
VLDL: 14.2 mg/dL (ref 0.0–40.0)

## 2022-05-31 NOTE — Patient Instructions (Signed)
It was great to see you!  I'd love to connect you with Dr. Denyse Amass -- let me know if you would like to pursue this.  Please go to the lab for blood work.   Our office will call you with your results unless you have chosen to receive results via MyChart.  If your blood work is normal we will follow-up each year for physicals and as scheduled for chronic medical problems.  If anything is abnormal we will treat accordingly and get you in for a follow-up.  Take care,  Lelon Mast

## 2022-06-01 ENCOUNTER — Encounter: Payer: Self-pay | Admitting: Physician Assistant

## 2022-06-01 ENCOUNTER — Other Ambulatory Visit: Payer: Self-pay | Admitting: Physician Assistant

## 2022-06-01 DIAGNOSIS — R748 Abnormal levels of other serum enzymes: Secondary | ICD-10-CM

## 2022-07-04 ENCOUNTER — Other Ambulatory Visit: Payer: 59

## 2022-07-05 ENCOUNTER — Other Ambulatory Visit (INDEPENDENT_AMBULATORY_CARE_PROVIDER_SITE_OTHER): Payer: 59

## 2022-07-05 DIAGNOSIS — R748 Abnormal levels of other serum enzymes: Secondary | ICD-10-CM | POA: Diagnosis not present

## 2022-07-05 LAB — COMPREHENSIVE METABOLIC PANEL
ALT: 28 U/L (ref 0–53)
AST: 22 U/L (ref 0–37)
Albumin: 3.9 g/dL (ref 3.5–5.2)
Alkaline Phosphatase: 96 U/L (ref 39–117)
BUN: 19 mg/dL (ref 6–23)
CO2: 25 mEq/L (ref 19–32)
Calcium: 9.2 mg/dL (ref 8.4–10.5)
Chloride: 103 mEq/L (ref 96–112)
Creatinine, Ser: 1.01 mg/dL (ref 0.40–1.50)
GFR: 86.43 mL/min (ref >60.00)
Glucose, Bld: 94 mg/dL (ref 70–99)
Potassium: 3.8 mEq/L (ref 3.5–5.1)
Sodium: 135 mEq/L (ref 135–145)
Total Bilirubin: 1.5 mg/dL — ABNORMAL HIGH (ref 0.2–1.2)
Total Protein: 7.1 g/dL (ref 6.0–8.3)

## 2022-07-18 ENCOUNTER — Encounter: Payer: 59 | Admitting: Physician Assistant

## 2022-07-20 ENCOUNTER — Other Ambulatory Visit: Payer: Self-pay | Admitting: Physician Assistant

## 2022-07-20 ENCOUNTER — Encounter: Payer: Self-pay | Admitting: Physician Assistant

## 2022-07-20 DIAGNOSIS — R42 Dizziness and giddiness: Secondary | ICD-10-CM

## 2022-07-21 ENCOUNTER — Encounter (HOSPITAL_COMMUNITY): Payer: Self-pay

## 2022-07-21 ENCOUNTER — Other Ambulatory Visit: Payer: Self-pay

## 2022-07-21 ENCOUNTER — Emergency Department (HOSPITAL_COMMUNITY)
Admission: EM | Admit: 2022-07-21 | Discharge: 2022-07-21 | Discharge status: Against Medical Advice | Payer: 59 | Attending: Emergency Medicine | Admitting: Emergency Medicine

## 2022-07-21 DIAGNOSIS — Z5321 Procedure and treatment not carried out due to patient leaving prior to being seen by health care provider: Secondary | ICD-10-CM | POA: Insufficient documentation

## 2022-07-21 DIAGNOSIS — R231 Pallor: Secondary | ICD-10-CM | POA: Diagnosis not present

## 2022-07-21 DIAGNOSIS — R42 Dizziness and giddiness: Secondary | ICD-10-CM | POA: Diagnosis not present

## 2022-07-21 DIAGNOSIS — R519 Headache, unspecified: Secondary | ICD-10-CM | POA: Diagnosis not present

## 2022-07-21 LAB — URINALYSIS, ROUTINE W REFLEX MICROSCOPIC
Bilirubin Urine: NEGATIVE
Glucose, UA: NEGATIVE mg/dL
Hgb urine dipstick: NEGATIVE
Ketones, ur: NEGATIVE mg/dL
Nitrite: NEGATIVE
Protein, ur: NEGATIVE mg/dL
Specific Gravity, Urine: 1.005 (ref 1.005–1.030)
pH: 6 (ref 5.0–8.0)

## 2022-07-21 LAB — CBC
HCT: 46 % (ref 39.0–52.0)
Hemoglobin: 15.8 g/dL (ref 13.0–17.0)
MCH: 31.7 pg (ref 26.0–34.0)
MCHC: 34.3 g/dL (ref 30.0–36.0)
MCV: 92.4 fL (ref 80.0–100.0)
Platelets: 229 10*3/uL (ref 150–400)
RBC: 4.98 MIL/uL (ref 4.22–5.81)
RDW: 12.8 % (ref 11.5–15.5)
WBC: 14.8 10*3/uL — ABNORMAL HIGH (ref 4.0–10.5)
nRBC: 0 % (ref 0.0–0.2)

## 2022-07-21 LAB — BASIC METABOLIC PANEL
Anion gap: 6 (ref 5–15)
BUN: 20 mg/dL (ref 6–20)
CO2: 27 mmol/L (ref 22–32)
Calcium: 9.9 mg/dL (ref 8.9–10.3)
Chloride: 108 mmol/L (ref 98–111)
Creatinine, Ser: 0.99 mg/dL (ref 0.61–1.24)
GFR, Estimated: >60 mL/min (ref >60)
Glucose, Bld: 130 mg/dL — ABNORMAL HIGH (ref 70–99)
Potassium: 4.8 mmol/L (ref 3.5–5.1)
Sodium: 141 mmol/L (ref 135–145)

## 2022-07-21 MED ORDER — MECLIZINE HCL 25 MG PO TABS: 25.0000 mg | ORAL_TABLET | Freq: Once | ORAL | Status: DC

## 2022-07-21 NOTE — ED Triage Notes (Addendum)
Per EMS- patient c/o dizziness x 2 hours. Patient reports a history of vertigo and states the last episode was 2 years ago. Patient states he took Antivert 25 mg prior to EMS arrival   Patient states the dizziness (spinning) is worse with his eyes closed.

## 2022-07-21 NOTE — ED Provider Triage Note (Signed)
Emergency Medicine Provider Triage Evaluation Note  Chris Robertson , a 51 y.o. male  was evaluated in triage.  Pt complains of dizzy. Hx of vertigo.  Report room spinning sensation for the past few hrs.  Ringing sound in R ear, headache.  Has had hx of vertigo x 2 years after dental extraction. Takes meclizine earlier today without relief. No focal numbness/weakness or confusion  Review of Systems  Positive: As above Negative: As above  Physical Exam  BP 138/85 (BP Location: Left Arm)   Pulse (!) 122   Temp 97.6 F (36.4 C) (Oral)   Resp 16   Ht 6\' 2"  (1.88 m)   Wt 99.8 kg   SpO2 97%   BMI 28.25 kg/m  Gen:   Awake, no distress   Resp:  Normal effort  MSK:   Moves extremities without difficulty  Other:    Medical Decision Making  Medically screening exam initiated at 12:16 PM.  Appropriate orders placed.  Chris Robertson was informed that the remainder of the evaluation will be completed by another provider, this initial triage assessment does not replace that evaluation, and the importance of remaining in the ED until their evaluation is complete.     Domenic Moras, PA-C 07/21/22 1223

## 2022-07-21 NOTE — ED Notes (Signed)
Patient refused vitals   stated he was leaving and going home to manage his symptoms there   he was advised to stay if possible and he refused

## 2022-07-21 NOTE — ED Notes (Signed)
Pt eloped.

## 2022-07-24 ENCOUNTER — Encounter: Payer: Self-pay | Admitting: *Deleted

## 2022-07-25 NOTE — Therapy (Addendum)
OUTPATIENT PHYSICAL THERAPY VESTIBULAR EVALUATION     Patient Name: Chris Robertson MRN: [id number] DOB:[birth year], 51 y.o., male Today's Date: 07/26/2022  PCP: Jarold Motto, PA REFERRING PROVIDER: Jarold Motto, PA   PT End of Session - 07/26/22 0848     Visit Number 1    Number of Visits 13    Date for PT Re-Evaluation 09/06/22    Authorization Type [facility]    Authorization - Visit Number 13    Authorization - Number of Visits 25    PT Start Time 0757    PT Stop Time 0845    PT Time Calculation (min) 48 min    Activity Tolerance Patient tolerated treatment well    Behavior During Therapy [facility] for tasks assessed/performed             Past Medical History:  Diagnosis Date   Anxiety    Chicken pox    Depression    DOE (dyspnea on exertion) 10/11/2018   [place] disease    Hearing impaired    age 22   Heart murmur    since childhood   Kidney stone    Mild mitral valve prolapse 2013   Mild MVP with mild MR by echo in 2013, read as trivial in 2019.   Near syncope 10/11/2018   Panic disorder 2015   With agoraphobia   Rapid heart beat 10/11/2018   Spermatocele 08/2012   hyrocele bilateral and spermatocele left; PSA normal 2013-2016   Thrombocytopenia (HCC)    mild, chronic   Vitamin D deficiency    Past Surgical History:  Procedure Laterality Date   CHOLECYSTECTOMY  2007   PECTUS CARNATUM REPAIR  1989   SHOULDER SURGERY  1997   TOOTH EXTRACTION     TRANSTHORACIC ECHOCARDIOGRAM  10/03/2018   Normal LV size and function.  EF 55 to 60%.  No regional wall motion normality.  Trivial late mitral prolapse with trivial regurgitation.  Also trivial AI, TR, PR.   Patient Active Problem List   Diagnosis Date Noted   Vertigo, intermittent 09/12/2020   Lactic acidemia 09/12/2020   Elevated ferritin 05/18/2020   Family history of hemochromatosis 05/18/2020   Family history of Huntington's disease 05/18/2020   Seasonal allergic rhinitis due to pollen 02/27/2019    Depression with anxiety 02/27/2019   Congenital hearing loss of both ears 05/23/2016   Hydrocele in adult 05/12/2016    ONSET DATE: 3-4 weeks ago  REFERRING DIAG: R42 (ICD-10-CM) - Vertigo, intermittent  THERAPY DIAG:  BPPV (benign paroxysmal positional vertigo), right  Dizziness and giddiness  Cervicalgia  Rationale for Evaluation and Treatment Rehabilitation  SUBJECTIVE:   SUBJECTIVE STATEMENT: Patient reports that 3-4 weeks ago he was in a dental chair reclined and with his head turned for a root canal. Felt a "strong, aggressive" dizziness and nausea which lasted 30 minutes. Last Friday he went to the ED for vertigo but was not seen d/t the long wait. Feels like dizziness is getting worse with time. Feels like his "eyes have been going around in circles." Reports that dizziness is constant, no aggravating factors.Denies head trauma, infection/illness, vision changes/double vision, hearing loss, otalgia, photophobia, migraines. Reports that he recently changed his glasses which is helping him. Reports ongoing ringing in the R ear, photophobia. Reports that jaw pain is much better since root canal. Will be seeing a TMJ specialist on Monday.   Pt accompanied by: self  PERTINENT HISTORY: anxiety, depression, impaired hearing, panic disoder, shoulder surgery    PAIN:  Are you having pain? Yes: NPRS scale: 3-4/10 Pain location: L jaw Pain description: "soft dull" Aggravating factors: "a bad tooth" Relieving factors: root canal  PRECAUTIONS: None  WEIGHT BEARING RESTRICTIONS No  FALLS: Has patient fallen in last 6 months? No  LIVING ENVIRONMENT: Lives with: lives alone Lives in: House/apartment Stairs:  2-3 steps to enter Has following equipment at home: None  PLOF: Independent; works for [employer]  PATIENT GOALS improve dizziness   OBJECTIVE:   DIAGNOSTIC FINDINGS: 01/08/22 TMJ MRI:  Right TMJ: Slight anterior displacement of the disc in the closed position with  recapture upon opening. Thinned appearance of the intermediate zone of the disc in the open position with near bone on bone articulation possibly reflective of a central perforation/tear. Mild right TMJ osteoarthritis.   Left TMJ: Slight anterior displacement of the disc in the closed position with recapture upon opening. No evidence of left TMJ disc perforation/tear. No significant left TMJ degenerative change.    COGNITION: Overall cognitive status: Within functional limits for tasks assessed   SENSATION: WFL  POSTURE: rounded shoulders and forward head   GAIT: Gait pattern: WFL Assistive device utilized: None Level of assistance: Complete Independence   FUNCTIONAL TESTs:     M-CTSIB  Condition 1: Firm Surface, EO 30 Sec, Normal Sway  Condition 2: Firm Surface, EC 30 Sec, Normal and Mild Sway  Condition 3: Foam Surface, EO 30 Sec, Normal and Mild Sway  Condition 4: Foam Surface, EC 30 Sec, Mild and Moderate Sway     PATIENT SURVEYS:  FOTO [phone]   VESTIBULAR ASSESSMENT   GENERAL OBSERVATION: patient is wearing progressive lenses    OCULOMOTOR EXAM:   Ocular Alignment: normal   Ocular ROM: No Limitations   Spontaneous Nystagmus: absent   Gaze-Induced Nystagmus: absent   Smooth Pursuits: intact c/o "dizziness was about to come on" with L and upward gaze   Saccades: hypometric/undershoots to R   Convergence/Divergence: 6 inches; R eye insufficiency    VESTIBULAR - OCULAR REFLEX:    Slow VOR: Normal c/o dizziness and poor tolerance horizontal and vertical   VOR Cancellation: Normal; c/o dizziness   Head-Impulse Test: HIT Right: negative HIT Left: negative  *c/o dizziness B, no neck pain       POSITIONAL TESTING:  Patient with c/o dizziness upon laying supine- unable to visualize for nystagmus d/t patient closing eyes  Right Roll Test: negative Left Roll Test: negative Right Dix-Hallpike: ~30 sec off saccadic intrusions vs. R upbeating torsional nystagmus  and dizziness Left Dix-Hallpike: negative; Duration: c/o dizziness fro about 30 sec    MOTION SENSITIVITY:    Motion Sensitivity Quotient  Intensity: 0 = none, 1 = Lightheaded, 2 = Mild, 3 = Moderate, 4 = Severe, 5 = Vomiting  Intensity  1. Sitting to supine   2. Supine to L side   3. Supine to R side   4. Supine to sitting   5. L Hallpike-Dix   6. Up from L    7. R Hallpike-Dix   8. Up from R    9. Sitting, head  tipped to L knee   10. Head up from L  knee   11. Sitting, head  tipped to R knee   12. Head up from R  knee   13. Sitting head turns x5   14.Sitting head nods x5   15. In stance, 180  turn to L    16. In stance, 180  turn to R  VESTIBULAR TREATMENT:  R Epley x1- tolerated well   PATIENT EDUCATION: Education details: prognosis, POC, HEP, answered patient's questions Person educated: Patient Education method: Explanation, Demonstration, Tactile cues, Verbal cues, and Handouts Education comprehension: verbalized understanding   GOALS: Goals reviewed with patient? Yes  SHORT TERM GOALS: Target date: 08/16/2022  Patient to be independent with initial HEP. Baseline: HEP initiated Goal status: INITIAL    LONG TERM GOALS: Target date: 09/06/2022  Patient to be independent with advanced HEP. Baseline: Not yet initiated  Goal status: INITIAL  Patient to report 0/10 dizziness with standing vertical and horizontal VOR for 30 seconds. Baseline: Unable Goal status: INITIAL  Patient will report 0/10 dizziness with bed mobility.  Baseline: Symptomatic  Goal status: INITIAL  Patient to demonstrate mild sway with M-CTSIB condition with eyes closed/foam surface in order to improve safety in environments with uneven surfaces and dim lighting. Baseline: mild-mod Goal status: INITIAL  Patient to score at least 22/30 on FGA in order to decrease risk of falls. Baseline: NT Goal status: INITIAL  Patient to score at least 50 on FOTO in order to  indicate improved functional outcomes.  Baseline: 36 Goal status: INITIAL    ASSESSMENT:  CLINICAL IMPRESSION:   Patient is a 51 y/o M presenting to OPPT with c/o dizziness acute dizziness for the past 3-4 weeks after sitting in a dentist's chair for a root canal. Reports that dizziness is constant, no aggravating factors. Denies head trauma, infection/illness, vision changes/double vision, hearing loss, otalgia, photophobia, migraines. Reports ongoing ringing in the R ear, photophobia, and chronic neck/TMJ pain that is improving. Patient today presented with rounded shoulders and forward head posture, difficulty utilizing vestibular system for balance, dizziness with smooth pursuits, undershooting with R saccades, convergence insufficiency, dizziness with VOR and VOR cancellation. Positional testing revealed possible positive R DH, treated with R Epley which was tolerated well. Patient was educated on gentle VOR and habituaiton HEP and reported understanding. Would benefit from skilled PT services 1-2x/week for 6 weeks to address aforementioned impairments in order to optimize level of function.     OBJECTIVE IMPAIRMENTS decreased balance, dizziness, and pain.   ACTIVITY LIMITATIONS carrying, lifting, bending, standing, squatting, bed mobility, bathing, dressing, reach over head, and hygiene/grooming  PARTICIPATION LIMITATIONS: meal prep, cleaning, laundry, driving, shopping, community activity, occupation, and church  PERSONAL FACTORS Age, Behavior pattern, Fitness, Past/current experiences, Time since onset of injury/illness/exacerbation, and 3+ comorbidities: anxiety, depression, impaired hearing, panic disoder, shoulder surgery  are also affecting patient's functional outcome.   REHAB POTENTIAL: Good  CLINICAL DECISION MAKING: Evolving/moderate complexity  EVALUATION COMPLEXITY: Moderate   PLAN: PT FREQUENCY: 1-2x/week  PT DURATION: 6 weeks  PLANNED INTERVENTIONS: Therapeutic  exercises, Therapeutic activity, Neuromuscular re-education, Balance training, Gait training, Patient/Family education, Self Care, Joint mobilization, Stair training, Vestibular training, Canalith repositioning, Aquatic Therapy, Dry Needling, Electrical stimulation, Cryotherapy, Moist heat, Taping, Manual therapy, and Re-evaluation  PLAN FOR NEXT SESSION: FGA, MSQ, cervical AROM, reassess HEP    Anette Guarneri, PT, DPT 07/26/22 9:03 AM  [facility] [address] [place], [place] [address] Phone # [phone] Fax # [phone]

## 2022-07-26 ENCOUNTER — Other Ambulatory Visit: Payer: Self-pay

## 2022-07-26 ENCOUNTER — Ambulatory Visit: Payer: 59 | Attending: Physician Assistant | Admitting: Physical Therapy

## 2022-07-26 ENCOUNTER — Encounter: Payer: Self-pay | Admitting: Physical Therapy

## 2022-07-26 DIAGNOSIS — H8111 Benign paroxysmal vertigo, right ear: Secondary | ICD-10-CM | POA: Diagnosis not present

## 2022-07-26 DIAGNOSIS — M542 Cervicalgia: Secondary | ICD-10-CM | POA: Insufficient documentation

## 2022-07-26 DIAGNOSIS — R42 Dizziness and giddiness: Secondary | ICD-10-CM | POA: Diagnosis not present

## 2022-07-31 DIAGNOSIS — M9902 Segmental and somatic dysfunction of thoracic region: Secondary | ICD-10-CM | POA: Diagnosis not present

## 2022-07-31 DIAGNOSIS — M26621 Arthralgia of right temporomandibular joint: Secondary | ICD-10-CM | POA: Diagnosis not present

## 2022-07-31 DIAGNOSIS — M9903 Segmental and somatic dysfunction of lumbar region: Secondary | ICD-10-CM | POA: Diagnosis not present

## 2022-07-31 DIAGNOSIS — M9907 Segmental and somatic dysfunction of upper extremity: Secondary | ICD-10-CM | POA: Diagnosis not present

## 2022-07-31 DIAGNOSIS — M40292 Other kyphosis, cervical region: Secondary | ICD-10-CM | POA: Diagnosis not present

## 2022-07-31 DIAGNOSIS — M9901 Segmental and somatic dysfunction of cervical region: Secondary | ICD-10-CM | POA: Diagnosis not present

## 2022-07-31 DIAGNOSIS — M26601 Right temporomandibular joint disorder, unspecified: Secondary | ICD-10-CM | POA: Diagnosis not present

## 2022-07-31 NOTE — Therapy (Addendum)
OUTPATIENT PHYSICAL THERAPY VESTIBULAR TREATMENT     Patient Name: Chris Robertson MRN: [id number] DOB:17-Mar-1971, 51 y.o., male Today's Date: 08/01/2022  PCP: Inda Coke, [place] REFERRING PROVIDER: Inda Coke, [place]    PT End of Session - 08/01/22 [phone]     Visit Number 2    Number of Visits 13    Date for PT Re-Evaluation 09/06/22    Authorization Type [facility]    Authorization - Visit Number 14    Authorization - Number of Visits 25   total for 2023   PT Start Time 0756    PT Stop Time 0830    PT Time Calculation (min) 34 min    Activity Tolerance Patient tolerated treatment well    Behavior During Therapy [facility] for tasks assessed/performed               Past Medical History:  Diagnosis Date   Anxiety    Chicken pox    Depression    DOE (dyspnea on exertion) 10/11/2018   Rosanna Randy disease    Hearing impaired    age 11   Heart murmur    since childhood   Kidney stone    Mild mitral valve prolapse 2013   Mild MVP with mild MR by echo in 2013, read as trivial in 2019.   Near syncope 10/11/2018   Panic disorder 2015   With agoraphobia   Rapid heart beat 10/11/2018   Spermatocele 08/2012   hyrocele bilateral and spermatocele left; PSA normal 2013-2016   Thrombocytopenia (HCC)    mild, chronic   Vitamin D deficiency    Past Surgical History:  Procedure Laterality Date   CHOLECYSTECTOMY  2007   [facility]   TOOTH EXTRACTION     TRANSTHORACIC ECHOCARDIOGRAM  10/03/2018   Normal LV size and function.  EF 55 to 60%.  No regional wall motion normality.  Trivial late mitral prolapse with trivial regurgitation.  Also trivial AI, TR, PR.   Patient Active Problem List   Diagnosis Date Noted   Vertigo, intermittent 09/12/2020   Lactic acidemia 09/12/2020   Elevated ferritin 05/18/2020   Family history of hemochromatosis 05/18/2020   Family history of Huntington's disease 05/18/2020   Seasonal allergic rhinitis due to  pollen 02/27/2019   Depression with anxiety 02/27/2019   Congenital hearing loss of both ears 05/23/2016   Hydrocele in adult 05/12/2016    ONSET DATE: 3-4 weeks ago  REFERRING DIAG: R42 (ICD-10-CM) - Vertigo, intermittent  THERAPY DIAG:  BPPV (benign paroxysmal positional vertigo), right  Dizziness and giddiness  Cervicalgia  Rationale for Evaluation and Treatment Rehabilitation  SUBJECTIVE:   SUBJECTIVE STATEMENT: "The ears feel fine but I am feeling it. I feel that it is coming from the amount of tension in my spine." Saw a chiropractor who believes that patient's symptoms at coming from C1-2. Had a treatment which gave him some relief until this AM. HEP is no longer making him feel dizzy.   Pt accompanied by: self  PERTINENT HISTORY: anxiety, depression, impaired hearing, panic disoder, shoulder surgery    PAIN:  Are you having pain? Yes: NPRS scale: 6-7/10 Pain location: lower spine to shoulders Pain description: pain, dizziness Aggravating factors: - Relieving factors: chiropractic treatment  PRECAUTIONS: None  PATIENT GOALS improve dizziness   OBJECTIVE:    TODAY'S TREATMENT: 08/01/22 Activity Comments  FGA 29/30  Edu and practice on new HEP including: cervical retraction, cervical extension and rotation SNAG  Tolerated well    CERVICAL ROM:   Active ROM A/PROM (deg) eval  Flexion 48 *pain  Extension 46 *pain  Right lateral flexion 43 *pain  Left lateral flexion 48 *pain  Right rotation 64 *pain  Left rotation 65 *pain   (Blank rows = not tested)    MOTION SENSITIVITY:    Motion Sensitivity Quotient  Intensity: 0 = none, 1 = Lightheaded, 2 = Mild, 3 = Moderate, 4 = Severe, 5 = Vomiting  Intensity  1. Sitting to supine 0  2. Supine to L side 0  3. Supine to R side 0  4. Supine to sitting 0  5. L Hallpike-Dix 0  6. Up from L  0  7. R Hallpike-Dix 0  8. Up from R  0  9. Sitting, head  tipped to L knee 0  10. Head up from L  knee 1   11. Sitting, head  tipped to R knee 0  12. Head up from R  knee 0  13. Sitting head turns x5 0; c/o muscle tightness   14.Sitting head nods x5 1  15. In stance, 180  turn to L  0  16. In stance, 180  turn to R 0     PATIENT EDUCATION: Education details: discussion on objective progress; HEP update Person educated: Patient Education method: Explanation, Demonstration, Tactile cues, Verbal cues, and Handouts Education comprehension: verbalized understanding and returned demonstration    HOME EXERCISE PROGRAM Last updated: 08/01/22 Access Code: [email or web address] URL: [email or web address] Date: 08/01/2022 Prepared by: [facility]  Exercises - Seated Nose to Left Knee Vestibular Habituation  - 1 x daily - 5 x weekly - 2 sets - 10 reps - Cervical Retraction with Overpressure  - 1 x daily - 5 x weekly - 2 sets - 10 reps - 3 sec hold - Mid-Lower Cervical Extension SNAG with Strap  - 1 x daily - 5 x weekly - 2 sets - 10 reps - Seated Assisted Cervical Rotation with Towel  - 1 x daily - 5 x weekly - 2 sets - 10 reps     Below measures were taken at time of initial evaluation unless otherwise specified:   DIAGNOSTIC FINDINGS: 01/08/22 TMJ MRI:  Right TMJ: Slight anterior displacement of the disc in the closed position with recapture upon opening. Thinned appearance of the intermediate zone of the disc in the open position with near bone on bone articulation possibly reflective of a central perforation/tear. Mild right TMJ osteoarthritis.   Left TMJ: Slight anterior displacement of the disc in the closed position with recapture upon opening. No evidence of left TMJ disc perforation/tear. No significant left TMJ degenerative change.    COGNITION: Overall cognitive status: Within functional limits for tasks assessed   SENSATION: WFL  POSTURE: rounded shoulders and forward head   GAIT: Gait pattern: WFL Assistive device  utilized: None Level of assistance: Complete Independence   FUNCTIONAL TESTs:     M-CTSIB  Condition 1: Firm Surface, EO 30 Sec, Normal Sway  Condition 2: Firm Surface, EC 30 Sec, Normal and Mild Sway  Condition 3: Foam Surface, EO 30 Sec, Normal and Mild Sway  Condition 4: Foam Surface, EC 30 Sec, Mild and Moderate Sway     PATIENT SURVEYS:  FOTO [phone]   VESTIBULAR ASSESSMENT   GENERAL OBSERVATION: patient is wearing progressive lenses    OCULOMOTOR EXAM:   Ocular Alignment: normal   Ocular ROM: No Limitations  Spontaneous Nystagmus: absent   Gaze-Induced Nystagmus: absent   Smooth Pursuits: intact c/o "dizziness was about to come on" with L and upward gaze   Saccades: hypometric/undershoots to R   Convergence/Divergence: 6 inches; R eye insufficiency    VESTIBULAR - OCULAR REFLEX:    Slow VOR: Normal c/o dizziness and poor tolerance horizontal and vertical   VOR Cancellation: Normal; c/o dizziness   Head-Impulse Test: HIT Right: negative HIT Left: negative  *c/o dizziness B, no neck pain       POSITIONAL TESTING:  Patient with c/o dizziness upon laying supine- unable to visualize for nystagmus d/t patient closing eyes  Right Roll Test: negative Left Roll Test: negative Right Dix-Hallpike: ~30 sec off saccadic intrusions vs. R upbeating torsional nystagmus and dizziness Left Dix-Hallpike: negative; Duration: c/o dizziness fro about 30 sec    MOTION SENSITIVITY:    Motion Sensitivity Quotient  Intensity: 0 = none, 1 = Lightheaded, 2 = Mild, 3 = Moderate, 4 = Severe, 5 = Vomiting  Intensity  1. Sitting to supine   2. Supine to L side   3. Supine to R side   4. Supine to sitting   5. L Hallpike-Dix   6. Up from L    7. R Hallpike-Dix   8. Up from R    9. Sitting, head  tipped to L knee   10. Head up from L  knee   11. Sitting, head  tipped to R knee   12. Head up from R  knee   13. Sitting head turns x5   14.Sitting head nods x5   15. In  stance, 180  turn to L    16. In stance, 180  turn to R       VESTIBULAR TREATMENT:  R Epley x1- tolerated well   PATIENT EDUCATION: Education details: prognosis, POC, HEP, answered patient's questions Person educated: Patient Education method: Explanation, Demonstration, Tactile cues, Verbal cues, and Handouts Education comprehension: verbalized understanding   GOALS: Goals reviewed with patient? Yes  SHORT TERM GOALS: Target date: 08/16/2022  Patient to be independent with initial HEP. Baseline: HEP initiated Goal status: MET 08/01/22    LONG TERM GOALS: Target date: 09/06/2022  Patient to be independent with advanced HEP. Baseline: Not yet initiated  Goal status: IN PROGRESS  Patient to report 0/10 dizziness with standing vertical and horizontal VOR for 30 seconds. Baseline: Unable Goal status: IN PROGRESS  Patient will report 0/10 dizziness with bed mobility.  Baseline: Symptomatic  Goal status: INITIAL 08/01/22  Patient to demonstrate mild sway with M-CTSIB condition with eyes closed/foam surface in order to improve safety in environments with uneven surfaces and dim lighting. Baseline: mild-mod Goal status: IN PROGRESS  Patient to score at least 22/30 on FGA in order to decrease risk of falls. Baseline: NT Goal status: MET 08/01/22  Patient to score at least 50 on FOTO in order to indicate improved functional outcomes.  Baseline: 36 Goal status: IN PROGRESS    ASSESSMENT:  CLINICAL IMPRESSION:  Patient arrived to session with report of some relief of dizziness/pain from a chiropractic treatment yesterday, but with return of sx this AM. Cervical AROM was slightly limited and painful in all planes. Patient scored 29/30 on FGA, indicating decreased risk of falls. MSQ revealed minimal sx of lightheadedness with coming up from forward bending from L side and with head nods. Also reports that HEP is no longer bringing on dizziness. Updated HEP with  cervical stretching as this is  patient's only remaining symptom. Placing patient on 30 day hold at this time d/t resolution of dizziness and patient's plans to address cervical pain with chiropractor. Patient in agreement.    OBJECTIVE IMPAIRMENTS decreased balance, dizziness, and pain.   ACTIVITY LIMITATIONS carrying, lifting, bending, standing, squatting, bed mobility, bathing, dressing, reach over head, and hygiene/grooming  PARTICIPATION LIMITATIONS: meal prep, cleaning, laundry, driving, shopping, community activity, occupation, and church  PERSONAL FACTORS Age, Behavior pattern, Fitness, Past/current experiences, Time since onset of injury/illness/exacerbation, and 3+ comorbidities: anxiety, depression, impaired hearing, panic disoder, shoulder surgery  are also affecting patient's functional outcome.   REHAB POTENTIAL: Good  CLINICAL DECISION MAKING: Evolving/moderate complexity  EVALUATION COMPLEXITY: Moderate   PLAN: PT FREQUENCY: 1-2x/week  PT DURATION: 6 weeks  PLANNED INTERVENTIONS: Therapeutic exercises, Therapeutic activity, Neuromuscular re-education, Balance training, Gait training, Patient/Family education, Self Care, Joint mobilization, Stair training, Vestibular training, Canalith repositioning, Aquatic Therapy, Dry Needling, Electrical stimulation, Cryotherapy, Moist heat, Taping, Manual therapy, and Re-evaluation  PLAN FOR NEXT SESSION: 30 day hold at this time    Janene Harvey, PT, DPT 08/01/22 8:35 AM  [facility] at [facility] [address] [place], [place] [address] Phone # [phone] Fax # [phone]    PHYSICAL THERAPY DISCHARGE SUMMARY  Visits from Start of Care: 2  Current functional level related to goals / functional outcomes: See above; Patient did not return during 30 day hold   Remaining deficits: See above goals   Education / Equipment: HEP  Plan: Patient agrees to discharge.   Patient goals were partially met. Patient is being discharged due to not returning.    Janene Harvey, PT, DPT 10/12/22 2:13 PM  [facility] at [facility] [address] [place], [place], [place] [address] Phone # [phone] Fax # [phone]

## 2022-08-01 ENCOUNTER — Ambulatory Visit: Payer: 59 | Attending: Physician Assistant | Admitting: Physical Therapy

## 2022-08-01 ENCOUNTER — Encounter: Payer: Self-pay | Admitting: Physical Therapy

## 2022-08-01 DIAGNOSIS — R42 Dizziness and giddiness: Secondary | ICD-10-CM | POA: Insufficient documentation

## 2022-08-01 DIAGNOSIS — H8111 Benign paroxysmal vertigo, right ear: Secondary | ICD-10-CM | POA: Diagnosis not present

## 2022-08-01 DIAGNOSIS — M542 Cervicalgia: Secondary | ICD-10-CM | POA: Diagnosis not present

## 2022-08-03 ENCOUNTER — Ambulatory Visit: Payer: 59 | Admitting: Physical Therapy

## 2022-08-07 ENCOUNTER — Ambulatory Visit: Payer: 59 | Admitting: Physical Therapy

## 2022-08-09 ENCOUNTER — Emergency Department (HOSPITAL_COMMUNITY)
Admission: EM | Admit: 2022-08-09 | Discharge: 2022-08-10 | Disposition: A | Discharge status: Home / Self Care | Payer: 59 | Attending: Emergency Medicine | Admitting: Emergency Medicine

## 2022-08-09 ENCOUNTER — Ambulatory Visit: Payer: 59 | Admitting: Physical Therapy

## 2022-08-09 ENCOUNTER — Encounter (HOSPITAL_COMMUNITY): Payer: Self-pay

## 2022-08-09 ENCOUNTER — Other Ambulatory Visit: Payer: Self-pay

## 2022-08-09 DIAGNOSIS — R112 Nausea with vomiting, unspecified: Secondary | ICD-10-CM | POA: Insufficient documentation

## 2022-08-09 DIAGNOSIS — R42 Dizziness and giddiness: Secondary | ICD-10-CM | POA: Diagnosis not present

## 2022-08-09 DIAGNOSIS — R1111 Vomiting without nausea: Secondary | ICD-10-CM | POA: Diagnosis not present

## 2022-08-09 LAB — CBC WITH DIFFERENTIAL/PLATELET
Abs Immature Granulocytes: 0.11 10*3/uL — ABNORMAL HIGH (ref 0.00–0.07)
Basophils Absolute: 0.1 10*3/uL (ref 0.0–0.1)
Basophils Relative: 0 %
Eosinophils Absolute: 0 10*3/uL (ref 0.0–0.5)
Eosinophils Relative: 0 %
HCT: 44.2 % (ref 39.0–52.0)
Hemoglobin: 15.4 g/dL (ref 13.0–17.0)
Immature Granulocytes: 1 %
Lymphocytes Relative: 6 %
Lymphs Abs: 0.9 10*3/uL (ref 0.7–4.0)
MCH: 32 pg (ref 26.0–34.0)
MCHC: 34.8 g/dL (ref 30.0–36.0)
MCV: 91.9 fL (ref 80.0–100.0)
Monocytes Absolute: 0.9 10*3/uL (ref 0.1–1.0)
Monocytes Relative: 6 %
Neutro Abs: 13.6 10*3/uL — ABNORMAL HIGH (ref 1.7–7.7)
Neutrophils Relative %: 87 %
Platelets: 170 10*3/uL (ref 150–400)
RBC: 4.81 MIL/uL (ref 4.22–5.81)
RDW: 13 % (ref 11.5–15.5)
WBC: 15.6 10*3/uL — ABNORMAL HIGH (ref 4.0–10.5)
nRBC: 0 % (ref 0.0–0.2)

## 2022-08-09 LAB — COMPREHENSIVE METABOLIC PANEL
ALT: 38 U/L (ref 0–44)
AST: 31 U/L (ref 15–41)
Albumin: 4.1 g/dL (ref 3.5–5.0)
Alkaline Phosphatase: 100 U/L (ref 38–126)
Anion gap: 7 (ref 5–15)
BUN: 22 mg/dL — ABNORMAL HIGH (ref 6–20)
CO2: 26 mmol/L (ref 22–32)
Calcium: 8.9 mg/dL (ref 8.9–10.3)
Chloride: 105 mmol/L (ref 98–111)
Creatinine, Ser: 0.97 mg/dL (ref 0.61–1.24)
GFR, Estimated: >60 mL/min (ref >60)
Glucose, Bld: 128 mg/dL — ABNORMAL HIGH (ref 70–99)
Potassium: 4.3 mmol/L (ref 3.5–5.1)
Sodium: 138 mmol/L (ref 135–145)
Total Bilirubin: 1 mg/dL (ref 0.3–1.2)
Total Protein: 7.2 g/dL (ref 6.5–8.1)

## 2022-08-09 LAB — LIPASE, BLOOD: Lipase: 28 U/L (ref 11–51)

## 2022-08-09 MED ORDER — FAMOTIDINE IN NACL 20-0.9 MG/50ML-% IV SOLN
20.0000 mg | Freq: Once | INTRAVENOUS | Status: AC
  Administered 2022-08-09: 20 mg via INTRAVENOUS
  Filled 2022-08-09: qty 50

## 2022-08-09 MED ORDER — MECLIZINE HCL 25 MG PO TABS
25.0000 mg | ORAL_TABLET | Freq: Once | ORAL | Status: AC
  Administered 2022-08-10: 25 mg via ORAL
  Filled 2022-08-09: qty 1

## 2022-08-09 MED ORDER — SODIUM CHLORIDE 0.9 % IV BOLUS
1000.0000 mL | Freq: Once | INTRAVENOUS | Status: AC
  Administered 2022-08-09: 1000 mL via INTRAVENOUS

## 2022-08-09 MED ORDER — LORAZEPAM 2 MG/ML IJ SOLN
1.0000 mg | Freq: Once | INTRAMUSCULAR | Status: AC
  Administered 2022-08-10: 1 mg via INTRAVENOUS
  Filled 2022-08-09: qty 1

## 2022-08-09 MED ORDER — LORAZEPAM 2 MG/ML IJ SOLN
1.0000 mg | Freq: Once | INTRAMUSCULAR | Status: AC
  Administered 2022-08-09: 1 mg via INTRAVENOUS
  Filled 2022-08-09: qty 1

## 2022-08-09 NOTE — ED Triage Notes (Signed)
Patient arrives with [facility] from home c/o vertigo and n/v that started this afternoon at 1630. Pt has hx of vertigo; per EMS, pt took meclizine today with no relief. Pt reports abdominal pain from vomiting. Pt a&o x4, in no acute distress.  EMS vitals:  BP 150/80 HR 70 RR 16 97% O2 on RA

## 2022-08-09 NOTE — ED Provider Notes (Signed)
[facility]-EMERGENCY DEPT Provider Note   CSN: [id number] Arrival date & time: 08/09/22  1849     History  Chief Complaint  Patient presents with   Dizziness    Chris Robertson is a 51 y.o. male.  He has had on and off vertigo for the last 2 years after dental surgery.  He said he seen multiple providers for this.  He is on chronic meclizine but has been trying to wean off it.  Worsening of his vertigo today.  Said severe head spinning associated with nausea and vomiting.  Abdomen hurts from vomiting.  Has chronic tinnitus in his right ear worse today.  No headache.  Tried to do his neurovestibular exercises without any improvement.  The history is provided by the patient.  Dizziness Quality:  Head spinning, room spinning and vertigo Severity:  Severe Onset quality:  Sudden Duration:  1 day Timing:  Constant Progression:  Unchanged Chronicity:  Recurrent Relieved by:  Nothing Worsened by:  Movement and turning head Ineffective treatments:  Being still Associated symptoms: nausea, tinnitus and vomiting   Associated symptoms: no chest pain, no diarrhea, no headaches, no shortness of breath, no syncope and no vision changes        Home Medications Prior to Admission medications   Medication Sig Start Date End Date Taking? Authorizing Provider  Cholecalciferol (VITAMIN D) 2000 units CAPS     [provider]  meclizine (ANTIVERT) 12.5 MG tablet Take 12.5 mg by mouth 3 (three) times daily as needed for dizziness. Over the counter    [provider]  Multiple Vitamin (MULTIVITAMIN) tablet Take 1 tablet by mouth daily.    [provider]      Allergies    Patient has no known allergies.    Review of Systems   Review of Systems  Constitutional:  Negative for fever.  HENT:  Positive for tinnitus.   Eyes:  Negative for visual disturbance.  Respiratory:  Negative for shortness of breath.   Cardiovascular:  Negative for chest pain  and syncope.  Gastrointestinal:  Positive for nausea and vomiting. Negative for diarrhea.  Neurological:  Positive for dizziness. Negative for headaches.    Physical Exam Updated Vital Signs BP (!) 143/90   Pulse 66   Temp 98 F (36.7 C)   Resp 18   Ht 6\' 2"  (1.88 m)   Wt 99.8 kg   SpO2 97%   BMI 28.25 kg/m  Physical Exam Vitals and nursing note reviewed.  Constitutional:      General: He is not in acute distress.    Appearance: Normal appearance. He is well-developed.  HENT:     Head: Normocephalic and atraumatic.     Mouth/Throat:     Mouth: Mucous membranes are moist.     Pharynx: Oropharynx is clear.  Eyes:     Extraocular Movements: Extraocular movements intact.     Conjunctiva/sclera: Conjunctivae normal.     Pupils: Pupils are equal, round, and reactive to light.     Comments: No nystagmus  Cardiovascular:     Rate and Rhythm: Normal rate and regular rhythm.     Heart sounds: No murmur heard. Pulmonary:     Effort: Pulmonary effort is normal. No respiratory distress.     Breath sounds: Normal breath sounds.  Abdominal:     Palpations: Abdomen is soft.     Tenderness: There is no abdominal tenderness.  Musculoskeletal:        General: No swelling.  Cervical back: Neck supple.  Skin:    General: Skin is warm and dry.     Capillary Refill: Capillary refill takes less than 2 seconds.  Neurological:     General: No focal deficit present.     Mental Status: He is alert.     Sensory: No sensory deficit.     Motor: No weakness.     ED Results / Procedures / Treatments   Labs (all labs ordered are listed, but only abnormal results are displayed) Labs Reviewed  COMPREHENSIVE METABOLIC PANEL - Abnormal; Notable for the following components:      Result Value   Glucose, Bld 128 (*)    BUN 22 (*)    All other components within normal limits  CBC WITH DIFFERENTIAL/PLATELET - Abnormal; Notable for the following components:   WBC 15.6 (*)    Neutro Abs 13.6  (*)    Abs Immature Granulocytes 0.11 (*)    All other components within normal limits  LIPASE, BLOOD    EKG EKG Interpretation  Date/Time:  Wednesday August 09 2022 19:18:08 EDT Ventricular Rate:  62 PR Interval:  156 QRS Duration: 98 QT Interval:  442 QTC Calculation: 449 R Axis:   -56 Text Interpretation: Sinus rhythm Left anterior fascicular block Low voltage, precordial leads Consider right ventricular hypertrophy Borderline T abnormalities, anterior leads No old tracing to compare Confirmed by Aletta Edouard [phone]) on 08/09/2022 7:22:22 PM  Radiology No results found.  Procedures Procedures    Medications Ordered in ED Medications  sodium chloride 0.9 % bolus 1,000 mL (0 mLs Intravenous Stopped 08/09/22 2200)  LORazepam (ATIVAN) injection 1 mg (1 mg Intravenous Given 08/09/22 2004)  famotidine (PEPCID) IVPB 20 mg premix (0 mg Intravenous Stopped 08/09/22 2159)  meclizine (ANTIVERT) tablet 25 mg (25 mg Oral Given 08/10/22 0007)  LORazepam (ATIVAN) injection 1 mg (1 mg Intravenous Given 08/10/22 0007)    ED Course/ Medical Decision Making/ A&P Clinical Course as of 08/10/22 1053  Wed Aug 09, 2022  2135 Patient states he feels little bit better after the medications and fluids.  He still feeling dizzy though.  He is going to take his meclizine.  He does not have a ride home and wishes to take an Melburn Popper. [MB]  2357 Patient had an MRI with and without of brain back in November which did not show any acute findings. [MB]    Clinical Course User Index [MB] Hayden Rasmussen, MD                           Medical Decision Making Amount and/or Complexity of Data Reviewed Labs: ordered.  Risk Prescription drug management.   This patient complains of vertigo nausea vomiting; this involves an extensive number of treatment Options and is a complaint that carries with it a high risk of complications and morbidity. The differential includes vertigo, stroke, bleed,  dehydration, metabolic derangement  I ordered, reviewed and interpreted labs, which included CBC with elevated white count normal hemoglobin, chemistries with mildly elevated glucose normal LFTs I ordered medication IV fluids meclizine IV Ativan Pepcid and reviewed PMP when indicated.  Previous records obtained and reviewed In epic, patient with longstanding vertigo seen by multiple consultants. Cardiac monitoring reviewed, normal sinus rhythm Social determinants considered, patient with history of depression and social isolation Critical Interventions: None  After the interventions stated above, I reevaluated the patient and found patient to be improved although not fully resolved Admission and  further testing considered, his care is signed out to Dr. Wilkie Aye to follow-up on response of next dose of medications.  If patient doing better he likely can be discharged to follow-up with his treatment team.         Final Clinical Impression(s) / ED Diagnoses Final diagnoses:  Vertigo    Rx / DC Orders ED Discharge Orders          Ordered    ondansetron (ZOFRAN-ODT) 4 MG disintegrating tablet  Every 8 hours PRN        08/10/22 0015              Terrilee Files, MD 08/10/22 1056

## 2022-08-09 NOTE — ED Notes (Signed)
Patient reports dizziness with head movement.  Hesitant in getting up and attempting to walk.

## 2022-08-09 NOTE — Discharge Instructions (Signed)
You are seen in the emergency department for vertigo and nausea and vomiting.  You received some IV fluids and medications that helped your symptoms somewhat.  Please continue your meclizine and follow-up with your treatment team.  Return to the emergency department if any worsening or concerning symptoms.

## 2022-08-10 DIAGNOSIS — R42 Dizziness and giddiness: Secondary | ICD-10-CM | POA: Diagnosis not present

## 2022-08-10 DIAGNOSIS — R112 Nausea with vomiting, unspecified: Secondary | ICD-10-CM | POA: Diagnosis not present

## 2022-08-10 MED ORDER — ONDANSETRON 4 MG PO TBDP: 4.0000 mg | ORAL_TABLET | Freq: Three times a day (TID) | ORAL | 0 refills | Status: DC | PRN

## 2022-08-10 NOTE — ED Notes (Signed)
Patient ambulated with this RN as standy-by assist. Patient was able to ambulate on his own with no assistance needed. Patient states his symptoms have improved since arrival to the ED. Notified ED provider.

## 2022-08-11 ENCOUNTER — Ambulatory Visit: Payer: 59 | Admitting: Physician Assistant

## 2022-08-11 ENCOUNTER — Encounter: Payer: Self-pay | Admitting: Physician Assistant

## 2022-08-11 VITALS — BP 130/83 | HR 64 | Temp 97.1°F | Ht 74.0 in | Wt 221.0 lb

## 2022-08-11 DIAGNOSIS — M9903 Segmental and somatic dysfunction of lumbar region: Secondary | ICD-10-CM | POA: Diagnosis not present

## 2022-08-11 DIAGNOSIS — R42 Dizziness and giddiness: Secondary | ICD-10-CM

## 2022-08-11 DIAGNOSIS — M9901 Segmental and somatic dysfunction of cervical region: Secondary | ICD-10-CM | POA: Diagnosis not present

## 2022-08-11 DIAGNOSIS — D72829 Elevated white blood cell count, unspecified: Secondary | ICD-10-CM | POA: Diagnosis not present

## 2022-08-11 DIAGNOSIS — M9902 Segmental and somatic dysfunction of thoracic region: Secondary | ICD-10-CM | POA: Diagnosis not present

## 2022-08-11 DIAGNOSIS — M9907 Segmental and somatic dysfunction of upper extremity: Secondary | ICD-10-CM | POA: Diagnosis not present

## 2022-08-11 NOTE — Progress Notes (Signed)
Chris Robertson is a 51 y.o. male here for a follow up of a pre-existing problem.  History of Present Illness:   Chief Complaint  Patient presents with   Follow-up    ED visit for dizziness; states that he is a bit queezy, but is better.     HPI  Vertigo follow-up Has been weaning himself off of meclizine, as of Friday last week.  On Wednesday was working and was bending over and when he stood it up, vertigo attack started. Improved a bit but worsened when he got home.  Went to the ED and received IV fluids. Took the next day off. Worked today. Has not taken meclizine.  Taking tylenol for achiness.  Has been working with PT, bu is currently stable and was told to follow-prn.  Started seeing Dr. Ottis Stain at a [facility]. He reportedly has a C1 pinched nerve. He is pursuing non-surgical treatment of this with their office. Getting treatments 2-3 x per week.  Blood work in ER revealed elevated WBC 15.6 and slightly elevated BUN of 22 Would like these rechecked today. Had elevated WBC about 3 weeks ago also. Denies: fever, chills, recent URI sx, recent steroid use, urinary sx  Past Medical History:  Diagnosis Date   Anxiety    Chicken pox    Depression    DOE (dyspnea on exertion) 10/11/2018   Sullivan Lone disease    Hearing impaired    age 1   Heart murmur    since childhood   Kidney stone    Mild mitral valve prolapse 2013   Mild MVP with mild MR by echo in 2013, read as trivial in 2019.   Near syncope 10/11/2018   Panic disorder 2015   With agoraphobia   Rapid heart beat 10/11/2018   Spermatocele 08/2012   hyrocele bilateral and spermatocele left; PSA normal 2013-2016   Thrombocytopenia (HCC)    mild, chronic   Vitamin D deficiency      Social History   Tobacco Use   Smoking status: Never   Smokeless tobacco: Never  Vaping Use   Vaping Use: Never used  Substance Use Topics   Alcohol use: Not Currently    Comment: Sober since September 2019   Drug use: No     Past Surgical History:  Procedure Laterality Date   CHOLECYSTECTOMY  2007   PECTUS CARNATUM REPAIR  1989   SHOULDER SURGERY  1997   TOOTH EXTRACTION     TRANSTHORACIC ECHOCARDIOGRAM  10/03/2018   Normal LV size and function.  EF 55 to 60%.  No regional wall motion normality.  Trivial late mitral prolapse with trivial regurgitation.  Also trivial AI, TR, PR.    Family History  Problem Relation Age of Onset   Mental illness Mother    Huntington's disease Mother    Depression Mother    Mental illness Father    Hypertension Father    Diabetes Father    Hyperlipidemia Father    Depression Father    Obesity Father    Cataracts Father    Mental illness Sister    Depression Sister    Mental illness Brother    Mental illness Maternal Grandmother    Pancreatic cancer Maternal Grandfather    Mental illness Maternal Grandfather    Mental illness Paternal Grandmother    Mental illness Paternal Grandfather    Thyroid cancer Maternal Aunt    Mental illness Maternal Aunt    Mental illness Maternal Uncle    Mental illness  Paternal Aunt    Mental illness Paternal Uncle     No Known Allergies  Current Medications:   Current Outpatient Medications:    acetaminophen (TYLENOL) 500 MG tablet, Take 500 mg by mouth every 6 (six) hours as needed for moderate pain., Disp: , Rfl:    meclizine (ANTIVERT) 12.5 MG tablet, Take 12.5 mg by mouth 3 (three) times daily as needed for dizziness. Over the counter (Patient not taking: Reported on 08/11/2022), Disp: , Rfl:    ondansetron (ZOFRAN-ODT) 4 MG disintegrating tablet, Take 1 tablet (4 mg total) by mouth every 8 (eight) hours as needed for nausea or vomiting. (Patient not taking: Reported on 08/11/2022), Disp: 20 tablet, Rfl: 0   Review of Systems:   ROS Negative unless otherwise specified per HPI.  Vitals:   Vitals:   08/11/22 1400  BP: 130/83  Pulse: 64  Temp: (!) 97.1 F (36.2 C)  TempSrc: Temporal  SpO2: 99%  Weight: 221 lb  (100.2 kg)  Height: 6\' 2"  (1.88 m)     Body mass index is 28.37 kg/m.  Physical Exam:   Physical Exam Vitals and nursing note reviewed.  Constitutional:      General: He is not in acute distress.    Appearance: He is well-developed. He is not ill-appearing or toxic-appearing.  Cardiovascular:     Rate and Rhythm: Normal rate and regular rhythm.     Pulses: Normal pulses.     Heart sounds: Normal heart sounds, S1 normal and S2 normal.  Pulmonary:     Effort: Pulmonary effort is normal.     Breath sounds: Normal breath sounds.  Skin:    General: Skin is warm and dry.  Neurological:     Mental Status: He is alert.     GCS: GCS eye subscore is 4. GCS verbal subscore is 5. GCS motor subscore is 6.  Psychiatric:        Speech: Speech normal.        Behavior: Behavior normal. Behavior is cooperative.     Assessment and Plan:   Vertigo, intermittent Improving Continue to provide support as able Denies any needs at this time  Leukocytosis, unspecified type Unclear etiology No concerning sx Will recheck CBC and add smear Consider heme referral if persists    Inda Coke, PA-C

## 2022-08-11 NOTE — Patient Instructions (Signed)
It was great to see you!  Glad things are going well overall!  Let's recheck your blood results and we will take it from there. I will be in touch via MyChart with these results.  Take care,  Inda Coke PA-C

## 2022-08-12 LAB — COMPREHENSIVE METABOLIC PANEL
AG Ratio: 1.5 (calc) (ref 1.0–2.5)
ALT: 37 U/L (ref 9–46)
AST: 28 U/L (ref 10–35)
Albumin: 4.2 g/dL (ref 3.6–5.1)
Alkaline phosphatase (APISO): 110 U/L (ref 35–144)
BUN: 20 mg/dL (ref 7–25)
CO2: 26 mmol/L (ref 20–32)
Calcium: 9.3 mg/dL (ref 8.6–10.3)
Chloride: 105 mmol/L (ref 98–110)
Creat: 1.16 mg/dL (ref 0.70–1.30)
Globulin: 2.8 g/dL (calc) (ref 1.9–3.7)
Glucose, Bld: 89 mg/dL (ref 65–99)
Potassium: 4 mmol/L (ref 3.5–5.3)
Sodium: 140 mmol/L (ref 135–146)
Total Bilirubin: 1 mg/dL (ref 0.2–1.2)
Total Protein: 7 g/dL (ref 6.1–8.1)

## 2022-08-12 LAB — CBC WITH DIFFERENTIAL/PLATELET
Absolute Monocytes: 765 cells/uL (ref 200–950)
Basophils Absolute: 36 cells/uL (ref 0–200)
Basophils Relative: 0.4 %
Eosinophils Absolute: 90 cells/uL (ref 15–500)
Eosinophils Relative: 1 %
HCT: 45.6 % (ref 38.5–50.0)
Hemoglobin: 16 g/dL (ref 13.2–17.1)
Lymphs Abs: 1881 cells/uL (ref 850–3900)
MCH: 32.5 pg (ref 27.0–33.0)
MCHC: 35.1 g/dL (ref 32.0–36.0)
MCV: 92.5 fL (ref 80.0–100.0)
MPV: 11.7 fL (ref 7.5–12.5)
Monocytes Relative: 8.5 %
Neutro Abs: 6228 cells/uL (ref 1500–7800)
Neutrophils Relative %: 69.2 %
Platelets: 194 10*3/uL (ref 140–400)
RBC: 4.93 10*6/uL (ref 4.20–5.80)
RDW: 12.9 % (ref 11.0–15.0)
Total Lymphocyte: 20.9 %
WBC: 9 10*3/uL (ref 3.8–10.8)

## 2022-08-14 ENCOUNTER — Ambulatory Visit: Payer: 59 | Admitting: Physical Therapy

## 2022-08-16 ENCOUNTER — Ambulatory Visit: Payer: 59 | Admitting: Physical Therapy

## 2022-08-16 DIAGNOSIS — M9902 Segmental and somatic dysfunction of thoracic region: Secondary | ICD-10-CM | POA: Diagnosis not present

## 2022-08-16 DIAGNOSIS — M9901 Segmental and somatic dysfunction of cervical region: Secondary | ICD-10-CM | POA: Diagnosis not present

## 2022-08-16 DIAGNOSIS — M9907 Segmental and somatic dysfunction of upper extremity: Secondary | ICD-10-CM | POA: Diagnosis not present

## 2022-08-16 DIAGNOSIS — M9903 Segmental and somatic dysfunction of lumbar region: Secondary | ICD-10-CM | POA: Diagnosis not present

## 2022-08-17 DIAGNOSIS — M9903 Segmental and somatic dysfunction of lumbar region: Secondary | ICD-10-CM | POA: Diagnosis not present

## 2022-08-17 DIAGNOSIS — M9901 Segmental and somatic dysfunction of cervical region: Secondary | ICD-10-CM | POA: Diagnosis not present

## 2022-08-17 DIAGNOSIS — M9907 Segmental and somatic dysfunction of upper extremity: Secondary | ICD-10-CM | POA: Diagnosis not present

## 2022-08-17 DIAGNOSIS — M9902 Segmental and somatic dysfunction of thoracic region: Secondary | ICD-10-CM | POA: Diagnosis not present

## 2022-08-18 DIAGNOSIS — M9901 Segmental and somatic dysfunction of cervical region: Secondary | ICD-10-CM | POA: Diagnosis not present

## 2022-08-18 DIAGNOSIS — M9903 Segmental and somatic dysfunction of lumbar region: Secondary | ICD-10-CM | POA: Diagnosis not present

## 2022-08-18 DIAGNOSIS — M9902 Segmental and somatic dysfunction of thoracic region: Secondary | ICD-10-CM | POA: Diagnosis not present

## 2022-08-18 DIAGNOSIS — M9907 Segmental and somatic dysfunction of upper extremity: Secondary | ICD-10-CM | POA: Diagnosis not present

## 2022-08-21 ENCOUNTER — Ambulatory Visit: Payer: 59 | Admitting: Physical Therapy

## 2022-08-21 DIAGNOSIS — M9907 Segmental and somatic dysfunction of upper extremity: Secondary | ICD-10-CM | POA: Diagnosis not present

## 2022-08-21 DIAGNOSIS — M9901 Segmental and somatic dysfunction of cervical region: Secondary | ICD-10-CM | POA: Diagnosis not present

## 2022-08-21 DIAGNOSIS — M9903 Segmental and somatic dysfunction of lumbar region: Secondary | ICD-10-CM | POA: Diagnosis not present

## 2022-08-21 DIAGNOSIS — M9902 Segmental and somatic dysfunction of thoracic region: Secondary | ICD-10-CM | POA: Diagnosis not present

## 2022-08-23 ENCOUNTER — Ambulatory Visit: Payer: 59 | Admitting: Physical Therapy

## 2022-08-23 DIAGNOSIS — M9903 Segmental and somatic dysfunction of lumbar region: Secondary | ICD-10-CM | POA: Diagnosis not present

## 2022-08-23 DIAGNOSIS — M9907 Segmental and somatic dysfunction of upper extremity: Secondary | ICD-10-CM | POA: Diagnosis not present

## 2022-08-23 DIAGNOSIS — M9901 Segmental and somatic dysfunction of cervical region: Secondary | ICD-10-CM | POA: Diagnosis not present

## 2022-08-23 DIAGNOSIS — M9902 Segmental and somatic dysfunction of thoracic region: Secondary | ICD-10-CM | POA: Diagnosis not present

## 2022-08-25 DIAGNOSIS — M9902 Segmental and somatic dysfunction of thoracic region: Secondary | ICD-10-CM | POA: Diagnosis not present

## 2022-08-25 DIAGNOSIS — M9907 Segmental and somatic dysfunction of upper extremity: Secondary | ICD-10-CM | POA: Diagnosis not present

## 2022-08-25 DIAGNOSIS — M9903 Segmental and somatic dysfunction of lumbar region: Secondary | ICD-10-CM | POA: Diagnosis not present

## 2022-08-25 DIAGNOSIS — M9901 Segmental and somatic dysfunction of cervical region: Secondary | ICD-10-CM | POA: Diagnosis not present

## 2022-08-28 DIAGNOSIS — M9903 Segmental and somatic dysfunction of lumbar region: Secondary | ICD-10-CM | POA: Diagnosis not present

## 2022-08-28 DIAGNOSIS — M9901 Segmental and somatic dysfunction of cervical region: Secondary | ICD-10-CM | POA: Diagnosis not present

## 2022-08-28 DIAGNOSIS — M9907 Segmental and somatic dysfunction of upper extremity: Secondary | ICD-10-CM | POA: Diagnosis not present

## 2022-08-28 DIAGNOSIS — M9902 Segmental and somatic dysfunction of thoracic region: Secondary | ICD-10-CM | POA: Diagnosis not present

## 2022-08-30 DIAGNOSIS — M9907 Segmental and somatic dysfunction of upper extremity: Secondary | ICD-10-CM | POA: Diagnosis not present

## 2022-08-30 DIAGNOSIS — M9902 Segmental and somatic dysfunction of thoracic region: Secondary | ICD-10-CM | POA: Diagnosis not present

## 2022-08-30 DIAGNOSIS — M9903 Segmental and somatic dysfunction of lumbar region: Secondary | ICD-10-CM | POA: Diagnosis not present

## 2022-08-30 DIAGNOSIS — M9901 Segmental and somatic dysfunction of cervical region: Secondary | ICD-10-CM | POA: Diagnosis not present

## 2022-09-04 DIAGNOSIS — M9907 Segmental and somatic dysfunction of upper extremity: Secondary | ICD-10-CM | POA: Diagnosis not present

## 2022-09-04 DIAGNOSIS — M9903 Segmental and somatic dysfunction of lumbar region: Secondary | ICD-10-CM | POA: Diagnosis not present

## 2022-09-04 DIAGNOSIS — M9901 Segmental and somatic dysfunction of cervical region: Secondary | ICD-10-CM | POA: Diagnosis not present

## 2022-09-04 DIAGNOSIS — M9902 Segmental and somatic dysfunction of thoracic region: Secondary | ICD-10-CM | POA: Diagnosis not present

## 2022-09-06 DIAGNOSIS — R42 Dizziness and giddiness: Secondary | ICD-10-CM | POA: Diagnosis not present

## 2022-09-06 DIAGNOSIS — M9902 Segmental and somatic dysfunction of thoracic region: Secondary | ICD-10-CM | POA: Diagnosis not present

## 2022-09-06 DIAGNOSIS — M9903 Segmental and somatic dysfunction of lumbar region: Secondary | ICD-10-CM | POA: Diagnosis not present

## 2022-09-06 DIAGNOSIS — M9907 Segmental and somatic dysfunction of upper extremity: Secondary | ICD-10-CM | POA: Diagnosis not present

## 2022-09-06 DIAGNOSIS — M9901 Segmental and somatic dysfunction of cervical region: Secondary | ICD-10-CM | POA: Diagnosis not present

## 2022-09-08 DIAGNOSIS — M9907 Segmental and somatic dysfunction of upper extremity: Secondary | ICD-10-CM | POA: Diagnosis not present

## 2022-09-08 DIAGNOSIS — M9903 Segmental and somatic dysfunction of lumbar region: Secondary | ICD-10-CM | POA: Diagnosis not present

## 2022-09-08 DIAGNOSIS — M9902 Segmental and somatic dysfunction of thoracic region: Secondary | ICD-10-CM | POA: Diagnosis not present

## 2022-09-08 DIAGNOSIS — R42 Dizziness and giddiness: Secondary | ICD-10-CM | POA: Diagnosis not present

## 2022-09-08 DIAGNOSIS — M9901 Segmental and somatic dysfunction of cervical region: Secondary | ICD-10-CM | POA: Diagnosis not present

## 2022-09-11 DIAGNOSIS — M9902 Segmental and somatic dysfunction of thoracic region: Secondary | ICD-10-CM | POA: Diagnosis not present

## 2022-09-11 DIAGNOSIS — M9901 Segmental and somatic dysfunction of cervical region: Secondary | ICD-10-CM | POA: Diagnosis not present

## 2022-09-11 DIAGNOSIS — M9907 Segmental and somatic dysfunction of upper extremity: Secondary | ICD-10-CM | POA: Diagnosis not present

## 2022-09-11 DIAGNOSIS — M9903 Segmental and somatic dysfunction of lumbar region: Secondary | ICD-10-CM | POA: Diagnosis not present

## 2022-09-15 DIAGNOSIS — M9907 Segmental and somatic dysfunction of upper extremity: Secondary | ICD-10-CM | POA: Diagnosis not present

## 2022-09-15 DIAGNOSIS — M9903 Segmental and somatic dysfunction of lumbar region: Secondary | ICD-10-CM | POA: Diagnosis not present

## 2022-09-15 DIAGNOSIS — M9902 Segmental and somatic dysfunction of thoracic region: Secondary | ICD-10-CM | POA: Diagnosis not present

## 2022-09-15 DIAGNOSIS — M9901 Segmental and somatic dysfunction of cervical region: Secondary | ICD-10-CM | POA: Diagnosis not present

## 2022-09-18 DIAGNOSIS — M9901 Segmental and somatic dysfunction of cervical region: Secondary | ICD-10-CM | POA: Diagnosis not present

## 2022-09-18 DIAGNOSIS — M9903 Segmental and somatic dysfunction of lumbar region: Secondary | ICD-10-CM | POA: Diagnosis not present

## 2022-09-18 DIAGNOSIS — M9907 Segmental and somatic dysfunction of upper extremity: Secondary | ICD-10-CM | POA: Diagnosis not present

## 2022-09-18 DIAGNOSIS — M9902 Segmental and somatic dysfunction of thoracic region: Secondary | ICD-10-CM | POA: Diagnosis not present

## 2022-09-20 DIAGNOSIS — M9907 Segmental and somatic dysfunction of upper extremity: Secondary | ICD-10-CM | POA: Diagnosis not present

## 2022-09-20 DIAGNOSIS — M9901 Segmental and somatic dysfunction of cervical region: Secondary | ICD-10-CM | POA: Diagnosis not present

## 2022-09-20 DIAGNOSIS — M9902 Segmental and somatic dysfunction of thoracic region: Secondary | ICD-10-CM | POA: Diagnosis not present

## 2022-09-20 DIAGNOSIS — M9903 Segmental and somatic dysfunction of lumbar region: Secondary | ICD-10-CM | POA: Diagnosis not present

## 2022-09-28 ENCOUNTER — Other Ambulatory Visit: Payer: Self-pay

## 2022-09-28 ENCOUNTER — Emergency Department (HOSPITAL_BASED_OUTPATIENT_CLINIC_OR_DEPARTMENT_OTHER)
Admission: EM | Admit: 2022-09-28 | Discharge: 2022-09-28 | Disposition: A | Discharge status: Home / Self Care | Payer: 59 | Attending: Emergency Medicine | Admitting: Emergency Medicine

## 2022-09-28 DIAGNOSIS — R0689 Other abnormalities of breathing: Secondary | ICD-10-CM | POA: Diagnosis not present

## 2022-09-28 DIAGNOSIS — R112 Nausea with vomiting, unspecified: Secondary | ICD-10-CM | POA: Diagnosis not present

## 2022-09-28 DIAGNOSIS — I959 Hypotension, unspecified: Secondary | ICD-10-CM | POA: Diagnosis not present

## 2022-09-28 DIAGNOSIS — R42 Dizziness and giddiness: Secondary | ICD-10-CM | POA: Insufficient documentation

## 2022-09-28 DIAGNOSIS — R110 Nausea: Secondary | ICD-10-CM | POA: Diagnosis not present

## 2022-09-28 LAB — CBC WITH DIFFERENTIAL/PLATELET
Abs Immature Granulocytes: 0.1 10*3/uL — ABNORMAL HIGH (ref 0.00–0.07)
Basophils Absolute: 0 10*3/uL (ref 0.0–0.1)
Basophils Relative: 0 %
Eosinophils Absolute: 0 10*3/uL (ref 0.0–0.5)
Eosinophils Relative: 0 %
HCT: 43.8 % (ref 39.0–52.0)
Hemoglobin: 15.5 g/dL (ref 13.0–17.0)
Immature Granulocytes: 1 %
Lymphocytes Relative: 5 %
Lymphs Abs: 0.8 10*3/uL (ref 0.7–4.0)
MCH: 32.1 pg (ref 26.0–34.0)
MCHC: 35.4 g/dL (ref 30.0–36.0)
MCV: 90.7 fL (ref 80.0–100.0)
Monocytes Absolute: 0.7 10*3/uL (ref 0.1–1.0)
Monocytes Relative: 5 %
Neutro Abs: 12.6 10*3/uL — ABNORMAL HIGH (ref 1.7–7.7)
Neutrophils Relative %: 89 %
Platelets: 193 10*3/uL (ref 150–400)
RBC: 4.83 MIL/uL (ref 4.22–5.81)
RDW: 12.9 % (ref 11.5–15.5)
WBC: 14.1 10*3/uL — ABNORMAL HIGH (ref 4.0–10.5)
nRBC: 0 % (ref 0.0–0.2)

## 2022-09-28 LAB — COMPREHENSIVE METABOLIC PANEL
ALT: 31 U/L (ref 0–44)
AST: 30 U/L (ref 15–41)
Albumin: 4.2 g/dL (ref 3.5–5.0)
Alkaline Phosphatase: 91 U/L (ref 38–126)
Anion gap: 8 (ref 5–15)
BUN: 17 mg/dL (ref 6–20)
CO2: 26 mmol/L (ref 22–32)
Calcium: 9.1 mg/dL (ref 8.9–10.3)
Chloride: 105 mmol/L (ref 98–111)
Creatinine, Ser: 0.89 mg/dL (ref 0.61–1.24)
GFR, Estimated: >60 mL/min (ref >60)
Glucose, Bld: 112 mg/dL — ABNORMAL HIGH (ref 70–99)
Potassium: 4.5 mmol/L (ref 3.5–5.1)
Sodium: 139 mmol/L (ref 135–145)
Total Bilirubin: 1.1 mg/dL (ref 0.3–1.2)
Total Protein: 7 g/dL (ref 6.5–8.1)

## 2022-09-28 LAB — URINALYSIS, ROUTINE W REFLEX MICROSCOPIC
Bilirubin Urine: NEGATIVE
Glucose, UA: NEGATIVE mg/dL
Hgb urine dipstick: NEGATIVE
Ketones, ur: NEGATIVE mg/dL
Leukocytes,Ua: NEGATIVE
Nitrite: NEGATIVE
Protein, ur: NEGATIVE mg/dL
Specific Gravity, Urine: 1.013 (ref 1.005–1.030)
pH: 6 (ref 5.0–8.0)

## 2022-09-28 MED ORDER — PROMETHAZINE HCL 25 MG/ML IJ SOLN
INTRAMUSCULAR | Status: AC
  Filled 2022-09-28: qty 1

## 2022-09-28 MED ORDER — DIAZEPAM 5 MG/ML IJ SOLN
2.5000 mg | Freq: Once | INTRAMUSCULAR | Status: AC
  Administered 2022-09-28: 2.5 mg via INTRAVENOUS
  Filled 2022-09-28: qty 2

## 2022-09-28 MED ORDER — DEXAMETHASONE SODIUM PHOSPHATE 10 MG/ML IJ SOLN
10.0000 mg | Freq: Once | INTRAMUSCULAR | Status: AC
  Administered 2022-09-28: 10 mg via INTRAVENOUS
  Filled 2022-09-28: qty 1

## 2022-09-28 MED ORDER — SODIUM CHLORIDE 0.9 % IV BOLUS
1000.0000 mL | Freq: Once | INTRAVENOUS | Status: AC
  Administered 2022-09-28: 1000 mL via INTRAVENOUS

## 2022-09-28 MED ORDER — MECLIZINE HCL 25 MG PO TABS
25.0000 mg | ORAL_TABLET | Freq: Once | ORAL | Status: AC
  Administered 2022-09-28: 25 mg via ORAL
  Filled 2022-09-28: qty 1

## 2022-09-28 MED ORDER — LORAZEPAM 2 MG/ML IJ SOLN
1.0000 mg | Freq: Once | INTRAMUSCULAR | Status: AC
  Administered 2022-09-28: 1 mg via INTRAVENOUS
  Filled 2022-09-28: qty 1

## 2022-09-28 MED ORDER — SODIUM CHLORIDE 0.9 % IV SOLN
12.5000 mg | Freq: Once | INTRAVENOUS | Status: AC
  Administered 2022-09-28: 12.5 mg via INTRAVENOUS
  Filled 2022-09-28: qty 0.5

## 2022-09-28 NOTE — Discharge Instructions (Addendum)
Try repeatedly doing the Epley maneuver at home which was provided in your discharge instructions.  You may feel worse at first but over time you should feel better.  You can take the meclizine 25 mg 3 times a day as needed for continued vertigo.  You can apply ice and warm compresses to your TMJ and take ibuprofen 600 mg every 6-8 hours as needed for pain.  I have put in referrals to neuro rehab and ENT.  You will need to get involved with vestibular rehab therapy which is typically provided by ENT facilities.  Come back if any severe worsening vertigo especially of inability to walk, associated slurred speech, double vision, focal weakness in the arms or legs, or any other symptoms concerning to you.

## 2022-09-28 NOTE — ED Triage Notes (Signed)
Pt via [facility] for eval of persistent vertigo after home dose of antivert. Pt also states that "crystal" in the ear might have come dislodged  20g L hand, 4mg  zofran en route. VSS

## 2022-09-28 NOTE — ED Provider Notes (Signed)
[facility] EMERGENCY DEPT Provider Note   CSN: [id number] Arrival date & time: 09/28/22  1341     History  No chief complaint on file.   Chris Robertson is a 51 y.o. male.  Pt is a 51 yo male with a pmhx significant for vertigo, anxiety, depression, and hoh.  Pt has been having trouble with vertigo intermittently for the past 2 years.  He has seen neuro and pcp for the same.  Pt said he woke up this am with severe dizziness and n/v.  He took his meclizine which did not help.  He called EMS who gave him 4 mg zofran en route.  He does not feel any better.         Home Medications Prior to Admission medications   Medication Sig Start Date End Date Taking? Authorizing Provider  acetaminophen (TYLENOL) 500 MG tablet Take 500 mg by mouth every 6 (six) hours as needed for moderate pain.    [provider]  ondansetron (ZOFRAN-ODT) 4 MG disintegrating tablet Take 1 tablet (4 mg total) by mouth every 8 (eight) hours as needed for nausea or vomiting. Patient not taking: Reported on 08/11/2022 08/10/22   Terrilee Files, MD      Allergies    Patient has no known allergies.    Review of Systems   Review of Systems  Gastrointestinal:  Positive for nausea and vomiting.  Neurological:  Positive for dizziness.  All other systems reviewed and are negative.   Physical Exam Updated Vital Signs BP 132/82   Pulse 65   Temp 97.8 F (36.6 C) (Oral)   Resp 15   SpO2 98%  Physical Exam Vitals and nursing note reviewed.  Constitutional:      Appearance: Normal appearance.  HENT:     Head: Normocephalic and atraumatic.     Right Ear: External ear normal.     Left Ear: External ear normal.     Nose: Nose normal.     Mouth/Throat:     Mouth: Mucous membranes are dry.  Eyes:     Extraocular Movements: Extraocular movements intact.     Conjunctiva/sclera: Conjunctivae normal.     Pupils: Pupils are equal, round, and reactive to light.  Cardiovascular:     Rate  and Rhythm: Normal rate and regular rhythm.     Pulses: Normal pulses.     Heart sounds: Normal heart sounds.  Pulmonary:     Effort: Pulmonary effort is normal.     Breath sounds: Normal breath sounds.  Abdominal:     General: Abdomen is flat. Bowel sounds are normal.     Palpations: Abdomen is soft.  Musculoskeletal:        General: Normal range of motion.     Cervical back: Normal range of motion and neck supple.  Skin:    General: Skin is warm.     Capillary Refill: Capillary refill takes less than 2 seconds.  Neurological:     General: No focal deficit present.     Mental Status: He is alert and oriented to person, place, and time.  Psychiatric:        Mood and Affect: Mood normal.        Behavior: Behavior normal.     ED Results / Procedures / Treatments   Labs (all labs ordered are listed, but only abnormal results are displayed) Labs Reviewed  COMPREHENSIVE METABOLIC PANEL - Abnormal; Notable for the following components:      Result Value  Glucose, Bld 112 (*)    All other components within normal limits  CBC WITH DIFFERENTIAL/PLATELET - Abnormal; Notable for the following components:   WBC 14.1 (*)    Neutro Abs 12.6 (*)    Abs Immature Granulocytes 0.10 (*)    All other components within normal limits  URINALYSIS, ROUTINE W REFLEX MICROSCOPIC    EKG EKG Interpretation  Date/Time:  Thursday September 28 2022 13:46:28 EST Ventricular Rate:  67 PR Interval:  150 QRS Duration: 100 QT Interval:  436 QTC Calculation: 461 R Axis:   -46 Text Interpretation: Sinus rhythm Incomplete RBBB and LAFB Low voltage, precordial leads RSR' in V1 or V2, right VCD or RVH No significant change since last tracing Confirmed by Jacalyn Lefevre [phone]) on 09/28/2022 2:16:33 PM  Radiology No results found.  Procedures Procedures    Medications Ordered in ED Medications  meclizine (ANTIVERT) tablet 25 mg (has no administration in time range)  sodium chloride 0.9 % bolus  1,000 mL (1,000 mLs Intravenous New Bag/Given 09/28/22 1443)  LORazepam (ATIVAN) injection 1 mg (1 mg Intravenous Given 09/28/22 1443)  promethazine (PHENERGAN) 12.5 mg in sodium chloride 0.9 % 50 mL IVPB (0 mg Intravenous Stopped 09/28/22 1500)  promethazine (PHENERGAN) 25 MG/ML injection (  Given 09/28/22 1444)    ED Course/ Medical Decision Making/ A&P                           Medical Decision Making Amount and/or Complexity of Data Reviewed Labs: ordered.  Risk Prescription drug management.   This patient presents to the ED for concern of vertigo, this involves an extensive number of treatment options, and is a complaint that carries with it a high risk of complications and morbidity.  The differential diagnosis includes vertigo, electrolyte abn   Co morbidities that complicate the patient evaluation  vertigo, anxiety, depression, and hoh   Additional history obtained:  Additional history obtained from epic chart review External records from outside source obtained and reviewed including EMS report   Lab Tests:  I Ordered, and personally interpreted labs.  The pertinent results include:  cmp nl, cbc with wbc elevated at 14.1, cmp nl   Imaging Studies ordered:  MRI brain from 10-12-2023 reviewed.  It was nl. I agree with the radiologist interpretation   Cardiac Monitoring:  The patient was maintained on a cardiac monitor.  I personally viewed and interpreted the cardiac monitored which showed an underlying rhythm of: nsr   Medicines ordered and prescription drug management:  I ordered medication including ativan, phenergan  for dizziness  Reevaluation of the patient after these medicines showed that the patient improved I have reviewed the patients home medicines and have made adjustments as needed   Test Considered:  Mri, but nl mri 1 year ago and sx have been intermittent since then   Critical Interventions:  ivfs     Problem List / ED  Course:  Vertigo:  an acute on chronic issue.  Pt is feeling better, but still feels dizzy.  He is signed out to Dr. Elpidio Anis at shift change.   Reevaluation:  After the interventions noted above, I reevaluated the patient and found that they have :improved   Social Determinants of Health:  Lives at home   Dispostion:  Pending at shift change.        Final Clinical Impression(s) / ED Diagnoses Final diagnoses:  Vertigo    Rx / DC Orders ED Discharge Orders  None         Jacalyn Lefevre, MD 09/28/22 [phone]

## 2022-09-28 NOTE — ED Notes (Signed)
Patient verbalizes understanding of discharge instructions. Opportunity for questioning and answers were provided. Pt discharged from ED ambulatory using cab voucher provided by ED Director.

## 2022-09-28 NOTE — ED Provider Notes (Signed)
  Physical Exam  BP 124/79   Pulse 73   Temp 97.8 F (36.6 C) (Oral)   Resp 15   SpO2 97%   Physical Exam  Procedures  Procedures  ED Course / MDM   Clinical Course as of 09/28/22 1736  Thu Sep 28, 2022  1550 H/o intermittent chronic vertigo with previous reassuring MRI. " IMPRESSION: 1. No acute intracranial abnormality.  Face MRI reported separately. 2. Mild for age nonspecific cerebral white matter signal changes." Performed November 2022.  Still vertiginous despite phenergan, fluids and ativan. Will give meclizine, decadron, and valium and reassess/ambulate. [VB]  1733 Patient has ambulated steadily with nursing staff. I am not c/f posterior stroke. Still endorsing some symptoms but improvement.  I discussed the importance of performing Epley maneuver at home and also referring to ENT for vestibular rehab.  He has meclizine already which she can take.  Return precaution discussed.  He is safe for discharge at this time. [VB]    Clinical Course User Index [VB] Mardene Sayer, MD   Medical Decision Making Amount and/or Complexity of Data Reviewed Labs: ordered.  Risk Prescription drug management.         Mardene Sayer, MD 09/28/22 1736

## 2022-10-02 ENCOUNTER — Other Ambulatory Visit: Payer: Self-pay | Admitting: Physician Assistant

## 2022-10-02 ENCOUNTER — Encounter: Payer: Self-pay | Admitting: Physician Assistant

## 2022-10-02 DIAGNOSIS — M9902 Segmental and somatic dysfunction of thoracic region: Secondary | ICD-10-CM | POA: Diagnosis not present

## 2022-10-02 DIAGNOSIS — M9901 Segmental and somatic dysfunction of cervical region: Secondary | ICD-10-CM | POA: Diagnosis not present

## 2022-10-02 DIAGNOSIS — M9907 Segmental and somatic dysfunction of upper extremity: Secondary | ICD-10-CM | POA: Diagnosis not present

## 2022-10-02 DIAGNOSIS — M9903 Segmental and somatic dysfunction of lumbar region: Secondary | ICD-10-CM | POA: Diagnosis not present

## 2022-10-02 DIAGNOSIS — R42 Dizziness and giddiness: Secondary | ICD-10-CM

## 2022-10-02 NOTE — Telephone Encounter (Signed)
Okay to place referral

## 2022-10-03 ENCOUNTER — Encounter: Payer: Self-pay | Admitting: Physician Assistant

## 2022-10-03 ENCOUNTER — Ambulatory Visit: Payer: 59 | Admitting: Physician Assistant

## 2022-10-03 VITALS — BP 130/70 | HR 76 | Temp 97.5°F | Ht 74.0 in | Wt 225.2 lb

## 2022-10-03 DIAGNOSIS — R42 Dizziness and giddiness: Secondary | ICD-10-CM

## 2022-10-03 NOTE — Progress Notes (Signed)
Chris Robertson is a 51 y.o. male here for a follow up of a pre-existing problem.  History of Present Illness:   Chief Complaint  Patient presents with   Follow-up    Pt is here for f/u from ED visit on 11/30 for Vertigo. Pt needs to see you in order for referral to be released.    HPI  Vertigo Patient went to the ER on 11/30 after worsening vertigo as the day progressed. He is hoping to return to vestibular therapy. A referral for ENT was also placed while he was at the ER, he has not heard from them. Denies any chest pain, lightheadedness.    Past Medical History:  Diagnosis Date   Anxiety    Chicken pox    Depression    DOE (dyspnea on exertion) 10/11/2018   Sullivan Lone disease    Hearing impaired    age 64   Heart murmur    since childhood   Kidney stone    Mild mitral valve prolapse 2013   Mild MVP with mild MR by echo in 2013, read as trivial in 2019.   Near syncope 10/11/2018   Panic disorder 2015   With agoraphobia   Rapid heart beat 10/11/2018   Spermatocele 08/2012   hyrocele bilateral and spermatocele left; PSA normal 2013-2016   Thrombocytopenia (HCC)    mild, chronic   Vitamin D deficiency      Social History   Tobacco Use   Smoking status: Never   Smokeless tobacco: Never  Vaping Use   Vaping Use: Never used  Substance Use Topics   Alcohol use: Not Currently    Comment: Sober since September 2019   Drug use: No    Past Surgical History:  Procedure Laterality Date   CHOLECYSTECTOMY  2007   PECTUS CARNATUM REPAIR  1989   SHOULDER SURGERY  1997   TOOTH EXTRACTION     TRANSTHORACIC ECHOCARDIOGRAM  10/03/2018   Normal LV size and function.  EF 55 to 60%.  No regional wall motion normality.  Trivial late mitral prolapse with trivial regurgitation.  Also trivial AI, TR, PR.    Family History  Problem Relation Age of Onset   Mental illness Mother    Huntington's disease Mother    Depression Mother    Mental illness Father    Hypertension Father     Diabetes Father    Hyperlipidemia Father    Depression Father    Obesity Father    Cataracts Father    Mental illness Sister    Depression Sister    Mental illness Brother    Mental illness Maternal Grandmother    Pancreatic cancer Maternal Grandfather    Mental illness Maternal Grandfather    Mental illness Paternal Grandmother    Mental illness Paternal Grandfather    Thyroid cancer Maternal Aunt    Mental illness Maternal Aunt    Mental illness Maternal Uncle    Mental illness Paternal Aunt    Mental illness Paternal Uncle     No Known Allergies  Current Medications:   Current Outpatient Medications:    acetaminophen (TYLENOL) 500 MG tablet, Take 500 mg by mouth every 6 (six) hours as needed for moderate pain., Disp: , Rfl:    ondansetron (ZOFRAN-ODT) 4 MG disintegrating tablet, Take 1 tablet (4 mg total) by mouth every 8 (eight) hours as needed for nausea or vomiting., Disp: 20 tablet, Rfl: 0   Review of Systems:   ROS Negative unless otherwise specified per  HPI.  Vitals:   Vitals:   10/03/22 1323  BP: 130/70  Pulse: 76  Temp: (!) 97.5 F (36.4 C)  TempSrc: Temporal  SpO2: 97%  Weight: 225 lb 4 oz (102.2 kg)  Height: 6\' 2"  (1.88 m)     Body mass index is 28.92 kg/m.  Physical Exam:   Physical Exam Vitals and nursing note reviewed.  Constitutional:      General: He is not in acute distress.    Appearance: Normal appearance. He is well-developed. He is not ill-appearing.  HENT:     Head: Normocephalic and atraumatic.     Right Ear: External ear normal.     Left Ear: External ear normal.  Eyes:     Extraocular Movements: Extraocular movements intact.     Conjunctiva/sclera: Conjunctivae normal.     Pupils: Pupils are equal, round, and reactive to light.  Cardiovascular:     Rate and Rhythm: Normal rate and regular rhythm.     Heart sounds: Normal heart sounds. No murmur heard.    No gallop.  Pulmonary:     Effort: Pulmonary effort is normal. No  respiratory distress.     Breath sounds: Normal breath sounds. No wheezing or rales.  Musculoskeletal:        General: Normal range of motion.     Cervical back: Normal range of motion.  Skin:    General: Skin is warm and dry.  Neurological:     Mental Status: He is alert and oriented to person, place, and time.  Psychiatric:        Behavior: Behavior normal.        Thought Content: Thought content normal.        Judgment: Judgment normal.     Assessment and Plan:   Vertigo, intermittent Will submit referral to PT for vestibular rehab Offered referral to [facility] for Dizziness clinic but he declined as he is unable to travel there Follow-up with [facility] prn  I,Verona Buck,acting as a [place] for Neurosurgeon, PA.,have documented all relevant documentation on the behalf of [employer], PA,as directed by  Jarold Motto, PA while in the presence of Jarold Motto, Jarold Motto.  I, [place], Jarold Motto, have reviewed all documentation for this visit. The documentation on 10/03/22 for the exam, diagnosis, procedures, and orders are all accurate and complete.  14/05/23, PA-C

## 2022-10-04 DIAGNOSIS — M9903 Segmental and somatic dysfunction of lumbar region: Secondary | ICD-10-CM | POA: Diagnosis not present

## 2022-10-04 DIAGNOSIS — M9901 Segmental and somatic dysfunction of cervical region: Secondary | ICD-10-CM | POA: Diagnosis not present

## 2022-10-04 DIAGNOSIS — M9902 Segmental and somatic dysfunction of thoracic region: Secondary | ICD-10-CM | POA: Diagnosis not present

## 2022-10-04 DIAGNOSIS — M9907 Segmental and somatic dysfunction of upper extremity: Secondary | ICD-10-CM | POA: Diagnosis not present

## 2022-10-05 ENCOUNTER — Ambulatory Visit: Payer: 59 | Attending: Physician Assistant

## 2022-10-05 ENCOUNTER — Other Ambulatory Visit: Payer: Self-pay

## 2022-10-05 DIAGNOSIS — M542 Cervicalgia: Secondary | ICD-10-CM | POA: Diagnosis not present

## 2022-10-05 DIAGNOSIS — H8111 Benign paroxysmal vertigo, right ear: Secondary | ICD-10-CM | POA: Insufficient documentation

## 2022-10-05 DIAGNOSIS — R42 Dizziness and giddiness: Secondary | ICD-10-CM | POA: Diagnosis not present

## 2022-10-05 NOTE — Therapy (Signed)
OUTPATIENT PHYSICAL THERAPY VESTIBULAR EVALUATION     Patient Name: Chris Robertson MRN: [id number] DOB:1971-02-07, 51 y.o., male Today's Date: 10/05/2022  END OF SESSION:  PT End of Session - 10/05/22 0756     Visit Number 1    Number of Visits 4    Date for PT Re-Evaluation 11/02/22    Authorization Type [facility]    PT Start Time 0800    PT Stop Time 0845    PT Time Calculation (min) 45 min             Past Medical History:  Diagnosis Date   Anxiety    Chicken pox    Depression    DOE (dyspnea on exertion) 10/11/2018   S[place] Lone disease    Hearing impaired    age 70   Heart murmur    since childhood   Kidney stone    Mild mitral valve prolapse 2013   Mild MVP with mild MR by echo in 2013, read as trivial in 2019.   Near syncope 10/11/2018   Panic disorder 2015   With agoraphobia   Rapid heart beat 10/11/2018   Spermatocele 08/2012   hyrocele bilateral and spermatocele left; PSA normal 2013-2016   Thrombocytopenia (HCC)    mild, chronic   Vitamin D deficiency    Past Surgical History:  Procedure Laterality Date   CHOLECYSTECTOMY  2007   PECTUS CARNATUM REPAIR  1989   SHOULDER SURGERY  1997   TOOTH EXTRACTION     TRANSTHORACIC ECHOCARDIOGRAM  10/03/2018   Normal LV size and function.  EF 55 to 60%.  No regional wall motion normality.  Trivial late mitral prolapse with trivial regurgitation.  Also trivial AI, TR, PR.   Patient Active Problem List   Diagnosis Date Noted   Vertigo, intermittent 09/12/2020   Lactic acidemia 09/12/2020   Elevated ferritin 05/18/2020   Family history of hemochromatosis 05/18/2020   Family history of Huntington's disease 05/18/2020   Seasonal allergic rhinitis due to pollen 02/27/2019   Depression with anxiety 02/27/2019   Congenital hearing loss of both ears 05/23/2016   Hydrocele in adult 05/12/2016    PCP: Jarold Motto, PA REFERRING PROVIDER: Jarold Motto, PA  REFERRING DIAG: R42 (ICD-10-CM) - Vertigo,  intermittent  THERAPY DIAG:  BPPV (benign paroxysmal positional vertigo), right  Dizziness and giddiness  Cervicalgia  ONSET DATE: most recent episode x 1-2 weeks ago  Rationale for Evaluation and Treatment: Rehabilitation  SUBJECTIVE:   SUBJECTIVE STATEMENT: Vertigo of an on-going nature with episodic hx and most notably about a week ago experienced another episode of positional vertigo and attempted repositioning but was unsuccessful.  Pt reports he is also going to chiropractic for treatment of his cervical discomfort. Continuing to have positional dizziness. Having "motion heaviness in eyes" with position changes. Pt has been experiencing otalgia in the right ear with some shooting pains. Work for [employer] requiring a lot of driving which notes some triggering with head movements and bending over.   Pt accompanied by: self  PERTINENT HISTORY: hx of trigeminal neuralgia  PAIN:  Are you having pain? Yes: NPRS scale: 5/10 Pain location: C1-C2 posterior neck Pain description: sore Aggravating factors: activity, prolonged position Relieving factors: chiropractic  PRECAUTIONS: None  WEIGHT BEARING RESTRICTIONS: No  FALLS: Has patient fallen in last 6 months? No  LIVING ENVIRONMENT: Lives with: lives with their family  PLOF: Independent  PATIENT GOALS: get rid of symptoms  OBJECTIVE:   DIAGNOSTIC FINDINGS: none for this episode  COGNITION: Overall cognitive status: Within functional limits for tasks assessed   SENSATION: WFL  EDEMA:    MUSCLE TONE:    DTRs:    POSTURE:  No Significant postural limitations  Cervical ROM:    Active A/PROM (deg) eval  Flexion WNL -felt symptoms in eyes  Extension WNL-symptoms  Right lateral flexion WNL  Left lateral flexion WNL  Right rotation WNL  Left rotation WNL  (Blank rows = not tested)  STRENGTH:   LOWER EXTREMITY MMT:   MMT Right eval Left eval  Hip flexion    Hip abduction    Hip adduction    Hip  internal rotation    Hip external rotation    Knee flexion    Knee extension    Ankle dorsiflexion    Ankle plantarflexion    Ankle inversion    Ankle eversion    (Blank rows = not tested)  BED MOBILITY:  Independent  TRANSFERS: Independent  RAMP:   CURB:   GAIT: Gait pattern: WFL Distance walked:  Assistive device utilized: None Level of assistance: Complete Independence Comments:   FUNCTIONAL TESTS:    PATIENT SURVEYS:    VESTIBULAR ASSESSMENT:  GENERAL OBSERVATION:    SYMPTOM BEHAVIOR:  Subjective history: on/off issue over years  Non-Vestibular symptoms: neck pain, headaches, tinnitus, and migraine symptoms  Type of dizziness: Spinning/Vertigo  Frequency: position change  Duration: 10-30 sec  Aggravating factors: Induced by position change: bed mobility movements tend to trigger, bending over, looking up  Relieving factors: closing eyes and slow movements  Progression of symptoms: unchanged  OCULOMOTOR EXAM:  Ocular Alignment: normal  Ocular ROM: No Limitations  Spontaneous Nystagmus: absent  Gaze-Induced Nystagmus: absent  Smooth Pursuits: saccades and intrusions with vertical direction  Saccades: intact  Convergence/Divergence: 4 cm , decreased convergence left eye   VESTIBULAR - OCULAR REFLEX:   Slow VOR: Comment: horizontal symptomatic after 2-3 rotations, vertical symptomatic  VOR Cancellation: Comment: normal motion, but motion sensitive  Head-Impulse Test: HIT Right: negative HIT Left: positive  Dynamic Visual Acuity: Not able to be assessed   POSITIONAL TESTING: Right Dix-Hallpike: no nystagmus Left Dix-Hallpike: no nystagmus Right Roll Test: no nystagmus Left Roll Test: no nystagmus Right Sidelying: no nystagmus Left Sidelying: no nystagmus  MOTION SENSITIVITY:  Motion Sensitivity Quotient Intensity: 0 = none, 1 = Lightheaded, 2 = Mild, 3 = Moderate, 4 = Severe, 5 = Vomiting  Intensity  1. Sitting to supine   2. Supine to L side    3. Supine to R side   4. Supine to sitting   5. L Hallpike-Dix   6. Up from L    7. R Hallpike-Dix   8. Up from R    9. Sitting, head tipped to L knee   10. Head up from L knee   11. Sitting, head tipped to R knee   12. Head up from R knee   13. Sitting head turns x5   14.Sitting head nods x5   15. In stance, 180 turn to L    16. In stance, 180 turn to R     OTHOSTATICS: not done  FUNCTIONAL GAIT:   M-CTSIB  Condition 1: Firm Surface, EO 30 Sec, Normal Sway  Condition 2: Firm Surface, EC 30 Sec, Normal and Mild Sway  Condition 3: Foam Surface, EO 30 Sec, Normal and Mild Sway  Condition 4: Foam Surface, EC 30 Sec, Moderate Sway     VESTIBULAR TREATMENT:  DATE: see below  Canalith Repositioning:  Comment: not indicated today Gaze Adaptation:  x1 Viewing Horizontal: Position: seated/standing and x1 Viewing Vertical:  Position: seated/standing Habituation:  Brandt-Daroff: number of reps: 5 and Repeated Rolling: number of reps: 5 Other:   PATIENT EDUCATION: Education details: assessment findings Person educated: Patient Education method: Explanation Education comprehension: verbalized understanding  HOME EXERCISE PROGRAM:  GOALS: Goals reviewed with patient? Yes  SHORT TERM GOALS: Target date: same as LTG    LONG TERM GOALS: Target date: 11/02/2022    The patient will be independent with HEP for gaze adaptation, habituation, balance, and general mobility. Baseline:  Goal status: INITIAL  2.  Pt will report symptoms not exceeding 2/10 with movement to enable return to typical exercise routine Baseline: 4-8/10 with fast movements, bending over, etc Goal status: INITIAL    ASSESSMENT:  CLINICAL IMPRESSION: Patient is a 51 y.o. male who was seen today for physical therapy evaluation and treatment for dizziness/unsteadiness and hx of positional vertigo.   Seemingly he has resolved his positional vertigo as symptoms/nystagmus not present to any significant degree with today's exam.  Demonstrates positional and motion sensitivity and symptomatic issues with VOR activities and possibly demonstrating positive Head-Impulse Test response on left vs right. Patient has had experience with vestibular rehabilitation in the past and would benefit from sessions to refine his HEP and develop or refine adapative/compensatory strategies.    OBJECTIVE IMPAIRMENTS: decreased balance and dizziness.   ACTIVITY LIMITATIONS: bending and locomotion level  PARTICIPATION LIMITATIONS: driving and exercise routine  PERSONAL FACTORS: Time since onset of injury/illness/exacerbation and 1-2 comorbidities: comorbid conditions--see hx  are also affecting patient's functional outcome.   REHAB POTENTIAL: Good  CLINICAL DECISION MAKING: Stable/uncomplicated  EVALUATION COMPLEXITY: Low   PLAN:  PT FREQUENCY: 1x/week  PT DURATION: 4 weeks  PLANNED INTERVENTIONS: Therapeutic exercises, Therapeutic activity, Neuromuscular re-education, Balance training, Gait training, Patient/Family education, Self Care, Joint mobilization, Vestibular training, Canalith repositioning, Aquatic Therapy, Dry Needling, Spinal mobilization, Cryotherapy, Moist heat, Traction, and Manual therapy  PLAN FOR NEXT SESSION: re-assess positional symptoms if present, walking VOR activities, habituation (PNF chops)?   9:16 AM, 10/05/22 M. Shary Decamp, PT, DPT Physical Therapist- [place] Office Number: [phone]

## 2022-10-06 DIAGNOSIS — M9907 Segmental and somatic dysfunction of upper extremity: Secondary | ICD-10-CM | POA: Diagnosis not present

## 2022-10-06 DIAGNOSIS — M9901 Segmental and somatic dysfunction of cervical region: Secondary | ICD-10-CM | POA: Diagnosis not present

## 2022-10-06 DIAGNOSIS — M9902 Segmental and somatic dysfunction of thoracic region: Secondary | ICD-10-CM | POA: Diagnosis not present

## 2022-10-06 DIAGNOSIS — M9903 Segmental and somatic dysfunction of lumbar region: Secondary | ICD-10-CM | POA: Diagnosis not present

## 2022-10-10 ENCOUNTER — Ambulatory Visit: Payer: 59

## 2022-10-10 DIAGNOSIS — R42 Dizziness and giddiness: Secondary | ICD-10-CM

## 2022-10-10 DIAGNOSIS — H8111 Benign paroxysmal vertigo, right ear: Secondary | ICD-10-CM

## 2022-10-10 DIAGNOSIS — M542 Cervicalgia: Secondary | ICD-10-CM

## 2022-10-10 NOTE — Therapy (Signed)
OUTPATIENT PHYSICAL THERAPY VESTIBULAR TREATMENT     Patient Name: Chris Robertson MRN: [id number] DOB:[birth year], 51 y.o., male Today's Date: 10/10/2022  END OF SESSION:  PT End of Session - 10/10/22 0755     Visit Number 2    Number of Visits 4    Date for PT Re-Evaluation 11/02/22    Authorization Type [facility]    PT Start Time 0800    PT Stop Time 0845    PT Time Calculation (min) 45 min             Past Medical History:  Diagnosis Date   Anxiety    Chicken pox    Depression    DOE (dyspnea on exertion) 10/11/2018   [place] Randy disease    Hearing impaired    age 32   Heart murmur    since childhood   Kidney stone    Mild mitral valve prolapse 2013   Mild MVP with mild MR by echo in 2013, read as trivial in 2019.   Near syncope 10/11/2018   Panic disorder 2015   With agoraphobia   Rapid heart beat 10/11/2018   Spermatocele 08/2012   hyrocele bilateral and spermatocele left; PSA normal 2013-2016   Thrombocytopenia (HCC)    mild, chronic   Vitamin D deficiency    Past Surgical History:  Procedure Laterality Date   CHOLECYSTECTOMY  2007   [place]   TOOTH EXTRACTION     TRANSTHORACIC ECHOCARDIOGRAM  10/03/2018   Normal LV size and function.  EF 55 to 60%.  No regional wall motion normality.  Trivial late mitral prolapse with trivial regurgitation.  Also trivial AI, TR, PR.   Patient Active Problem List   Diagnosis Date Noted   Vertigo, intermittent 09/12/2020   Lactic acidemia 09/12/2020   Elevated ferritin 05/18/2020   Family history of hemochromatosis 05/18/2020   Family history of Huntington's disease 05/18/2020   Seasonal allergic rhinitis due to pollen 02/27/2019   Depression with anxiety 02/27/2019   Congenital hearing loss of both ears 05/23/2016   Hydrocele in adult 05/12/2016    PCP: Inda Coke, PA REFERRING PROVIDER: Inda Coke, PA  REFERRING DIAG: R42 (ICD-10-CM) - Vertigo,  intermittent  THERAPY DIAG:  BPPV (benign paroxysmal positional vertigo), right  Dizziness and giddiness  Cervicalgia  ONSET DATE: most recent episode x 1-2 weeks ago  Rationale for Evaluation and Treatment: Rehabilitation  SUBJECTIVE:   SUBJECTIVE STATEMENT: Positional vertigo seems to be better but the cervical vertigo seems to be worse.  Reports feeling of numbness along the spine.  Notes sensitivity to sound with feeling of sound reverberating in the head. Notes that he feels very off balanced and closing eyes caused worse nausea. Pt reports on-going chiropractor visits where they are using an "Adjustment gun" along the length of spine to open up his C1-C2 nerves. Reports he performed Brandt-Darof and felt heaviness on right temporal  Pt accompanied by: self  PERTINENT HISTORY: hx of trigeminal neuralgia  PAIN:  Are you having pain? Yes: NPRS scale: 5/10 Pain location: C1-C2 posterior neck Pain description: sore Aggravating factors: activity, prolonged position Relieving factors: chiropractic  PRECAUTIONS: None  WEIGHT BEARING RESTRICTIONS: No  FALLS: Has patient fallen in last 6 months? No  LIVING ENVIRONMENT: Lives with: lives with their family  PLOF: Independent  PATIENT GOALS: get rid of symptoms  OBJECTIVE:   TODAY'S TREATMENT: 10/10/22 Activity Comments  Right DH No nystagmus, very brief fleeting  sensation  Left DH No nystagmus, no symptoms  Pt education C-spine posture education and positioning in car, mirrors, upright chin retractions.   VOR x 1 (seated) Horizontal x 30 sec 3/10 with instances of saccadic intrusions Vertical x 30 sec--no issues  Walking VOR x 1 Horizontal/vertical--reports minimal symptoms, demonstrates minimal pathway deviation  VOR x 2 Standing, demonstration and education on when to implement and scale of symptoms     DIAGNOSTIC FINDINGS: none for this episode  COGNITION: Overall cognitive status: Within functional limits for  tasks assessed   SENSATION: WFL  EDEMA:    MUSCLE TONE:    DTRs:    POSTURE:  No Significant postural limitations  Cervical ROM:    Active A/PROM (deg) eval  Flexion WNL -felt symptoms in eyes  Extension WNL-symptoms  Right lateral flexion WNL  Left lateral flexion WNL  Right rotation WNL  Left rotation WNL  (Blank rows = not tested)  STRENGTH:   LOWER EXTREMITY MMT:   MMT Right eval Left eval  Hip flexion    Hip abduction    Hip adduction    Hip internal rotation    Hip external rotation    Knee flexion    Knee extension    Ankle dorsiflexion    Ankle plantarflexion    Ankle inversion    Ankle eversion    (Blank rows = not tested)  BED MOBILITY:  Independent  TRANSFERS: Independent  RAMP:   CURB:   GAIT: Gait pattern: WFL Distance walked:  Assistive device utilized: None Level of assistance: Complete Independence Comments:   FUNCTIONAL TESTS:    PATIENT SURVEYS:    VESTIBULAR ASSESSMENT:  GENERAL OBSERVATION:    SYMPTOM BEHAVIOR:  Subjective history: on/off issue over years  Non-Vestibular symptoms: neck pain, headaches, tinnitus, and migraine symptoms  Type of dizziness: Spinning/Vertigo  Frequency: position change  Duration: 10-30 sec  Aggravating factors: Induced by position change: bed mobility movements tend to trigger, bending over, looking up  Relieving factors: closing eyes and slow movements  Progression of symptoms: unchanged  OCULOMOTOR EXAM:  Ocular Alignment: normal  Ocular ROM: No Limitations  Spontaneous Nystagmus: absent  Gaze-Induced Nystagmus: absent  Smooth Pursuits: saccades and intrusions with vertical direction  Saccades: intact  Convergence/Divergence: 4 cm , decreased convergence left eye   VESTIBULAR - OCULAR REFLEX:   Slow VOR: Comment: horizontal symptomatic after 2-3 rotations, vertical symptomatic  VOR Cancellation: Comment: normal motion, but motion sensitive  Head-Impulse Test: HIT  Right: negative HIT Left: positive  Dynamic Visual Acuity: Not able to be assessed   POSITIONAL TESTING: Right Dix-Hallpike: no nystagmus Left Dix-Hallpike: no nystagmus Right Roll Test: no nystagmus Left Roll Test: no nystagmus Right Sidelying: no nystagmus Left Sidelying: no nystagmus  MOTION SENSITIVITY:  Motion Sensitivity Quotient Intensity: 0 = none, 1 = Lightheaded, 2 = Mild, 3 = Moderate, 4 = Severe, 5 = Vomiting  Intensity  1. Sitting to supine   2. Supine to L side   3. Supine to R side   4. Supine to sitting   5. L Hallpike-Dix   6. Up from L    7. R Hallpike-Dix   8. Up from R    9. Sitting, head tipped to L knee   10. Head up from L knee   11. Sitting, head tipped to R knee   12. Head up from R knee   13. Sitting head turns x5   14.Sitting head nods x5   15. In stance, 180 turn  to L    16. In stance, 180 turn to R     OTHOSTATICS: not done  FUNCTIONAL GAIT:   M-CTSIB  Condition 1: Firm Surface, EO 30 Sec, Normal Sway  Condition 2: Firm Surface, EC 30 Sec, Normal and Mild Sway  Condition 3: Foam Surface, EO 30 Sec, Normal and Mild Sway  Condition 4: Foam Surface, EC 30 Sec, Moderate Sway     VESTIBULAR TREATMENT:                                                                                                   DATE: see below  Canalith Repositioning:  Comment: not indicated today Gaze Adaptation:  x1 Viewing Horizontal: Position: seated/standing and x1 Viewing Vertical:  Position: seated/standing Habituation:  Brandt-Daroff: number of reps: 5 and Repeated Rolling: number of reps: 5 Other:   PATIENT EDUCATION: Education details: assessment findings Person educated: Patient Education method: Explanation Education comprehension: verbalized understanding    GOALS: Goals reviewed with patient? Yes  SHORT TERM GOALS: Target date: same as LTG    LONG TERM GOALS: Target date: 11/02/2022    The patient will be independent with HEP for gaze  adaptation, habituation, balance, and general mobility. Baseline:  Goal status: INITIAL  2.  Pt will report symptoms not exceeding 2/10 with movement to enable return to typical exercise routine Baseline: 4-8/10 with fast movements, bending over, etc Goal status: INITIAL    ASSESSMENT:  CLINICAL IMPRESSION: Positional testing reveals no nystagmus and minimal report of symptoms with only a slight, fleeting sensation when in right Dix-Hallpike.  During VOR x 1 horizontal demonstrates instances of retinal slip R > L. Initiated walking VOR for added challenge and to progress activities to address these underlying issues.  Pt feels that many of his symptoms are arising from cervical spine, more specifically at C1-C2 dysfunction which is being treated by chiropractic.  Continued sessions to progress vestibular rehab  OBJECTIVE IMPAIRMENTS: decreased balance and dizziness.   ACTIVITY LIMITATIONS: bending and locomotion level  PARTICIPATION LIMITATIONS: driving and exercise routine  PERSONAL FACTORS: Time since onset of injury/illness/exacerbation and 1-2 comorbidities: comorbid conditions--see hx  are also affecting patient's functional outcome.   REHAB POTENTIAL: Good  CLINICAL DECISION MAKING: Stable/uncomplicated  EVALUATION COMPLEXITY: Low   PLAN:  PT FREQUENCY: 1x/week  PT DURATION: 4 weeks  PLANNED INTERVENTIONS: Therapeutic exercises, Therapeutic activity, Neuromuscular re-education, Balance training, Gait training, Patient/Family education, Self Care, Joint mobilization, Vestibular training, Canalith repositioning, Aquatic Therapy, Dry Needling, Spinal mobilization, Cryotherapy, Moist heat, Traction, and Manual therapy  PLAN FOR NEXT SESSION: re-assess positional symptoms if present, HEP review, D/C?   7:56 AM, 10/10/22 M. Sherlyn Lees, PT, DPT Physical Therapist- [place] Office Number: [phone]

## 2022-10-11 DIAGNOSIS — M9907 Segmental and somatic dysfunction of upper extremity: Secondary | ICD-10-CM | POA: Diagnosis not present

## 2022-10-11 DIAGNOSIS — R42 Dizziness and giddiness: Secondary | ICD-10-CM | POA: Diagnosis not present

## 2022-10-11 DIAGNOSIS — M9901 Segmental and somatic dysfunction of cervical region: Secondary | ICD-10-CM | POA: Diagnosis not present

## 2022-10-11 DIAGNOSIS — M26621 Arthralgia of right temporomandibular joint: Secondary | ICD-10-CM | POA: Diagnosis not present

## 2022-10-11 DIAGNOSIS — M26601 Right temporomandibular joint disorder, unspecified: Secondary | ICD-10-CM | POA: Diagnosis not present

## 2022-10-11 DIAGNOSIS — M9902 Segmental and somatic dysfunction of thoracic region: Secondary | ICD-10-CM | POA: Diagnosis not present

## 2022-10-11 DIAGNOSIS — M9903 Segmental and somatic dysfunction of lumbar region: Secondary | ICD-10-CM | POA: Diagnosis not present

## 2022-10-16 ENCOUNTER — Other Ambulatory Visit: Payer: Self-pay | Admitting: Chiropractic Medicine

## 2022-10-16 DIAGNOSIS — G540 Brachial plexus disorders: Secondary | ICD-10-CM

## 2022-10-17 ENCOUNTER — Ambulatory Visit: Payer: 59

## 2022-10-17 DIAGNOSIS — M542 Cervicalgia: Secondary | ICD-10-CM | POA: Diagnosis not present

## 2022-10-17 DIAGNOSIS — H8111 Benign paroxysmal vertigo, right ear: Secondary | ICD-10-CM | POA: Diagnosis not present

## 2022-10-17 DIAGNOSIS — R42 Dizziness and giddiness: Secondary | ICD-10-CM | POA: Diagnosis not present

## 2022-10-17 NOTE — Therapy (Signed)
OUTPATIENT PHYSICAL THERAPY VESTIBULAR TREATMENT and D/C Summary     Patient Name: Chris Robertson MRN: [id number] DOB:1971/05/12, 51 y.o., male Today's Date: 10/17/2022  PHYSICAL THERAPY DISCHARGE SUMMARY  Visits from Start of Care: 3  Current functional level related to goals / functional outcomes: Pt feels symptoms of positional vertigo have resolved   Remaining deficits: Continues to experience diffuse and global neck and back issues which are being treated by chiropractor   Education / Equipment: HEP   Patient agrees to discharge. Patient goals were met. Patient is being discharged due to being pleased with the current functional level.   END OF SESSION:  PT End of Session - 10/17/22 0756     Visit Number 3    Number of Visits 4    Date for PT Re-Evaluation 11/02/22    Authorization Type [facility]    Authorization - Visit Number 15    PT Start Time 0800    PT Stop Time 0830    PT Time Calculation (min) 30 min             Past Medical History:  Diagnosis Date   Anxiety    Chicken pox    Depression    DOE (dyspnea on exertion) 10/11/2018   Rosanna Randy disease    Hearing impaired    age 76   Heart murmur    since childhood   Kidney stone    Mild mitral valve prolapse 2013   Mild MVP with mild MR by echo in 2013, read as trivial in 2019.   Near syncope 10/11/2018   Panic disorder 2015   With agoraphobia   Rapid heart beat 10/11/2018   Spermatocele 08/2012   hyrocele bilateral and spermatocele left; PSA normal 2013-2016   Thrombocytopenia (HCC)    mild, chronic   Vitamin D deficiency    Past Surgical History:  Procedure Laterality Date   CHOLECYSTECTOMY  2007   [place]   TOOTH EXTRACTION     TRANSTHORACIC ECHOCARDIOGRAM  10/03/2018   Normal LV size and function.  EF 55 to 60%.  No regional wall motion normality.  Trivial late mitral prolapse with trivial regurgitation.  Also trivial AI, TR, PR.   Patient  Active Problem List   Diagnosis Date Noted   Vertigo, intermittent 09/12/2020   Lactic acidemia 09/12/2020   Elevated ferritin 05/18/2020   Family history of hemochromatosis 05/18/2020   Family history of Huntington's disease 05/18/2020   Seasonal allergic rhinitis due to pollen 02/27/2019   Depression with anxiety 02/27/2019   Congenital hearing loss of both ears 05/23/2016   Hydrocele in adult 05/12/2016    PCP: Inda Coke, PA REFERRING PROVIDER: Inda Coke, PA  REFERRING DIAG: R42 (ICD-10-CM) - Vertigo, intermittent  THERAPY DIAG:  BPPV (benign paroxysmal positional vertigo), right  Dizziness and giddiness  Cervicalgia  ONSET DATE: most recent episode x 1-2 weeks ago  Rationale for Evaluation and Treatment: Rehabilitation  SUBJECTIVE:   SUBJECTIVE STATEMENT: Feeling much better, pt reports main symptoms seems to eminate from the cervical issues and he and chiropractor suspect thoracic outlet syndrome and will have ultrasound for diagnosis  Pt accompanied by: self  PERTINENT HISTORY: hx of trigeminal neuralgia  PAIN:  Are you having pain? Yes: NPRS scale: 5/10 Pain location: C1-C2 posterior neck Pain description: sore Aggravating factors: activity, prolonged position Relieving factors: chiropractic  PRECAUTIONS: None  WEIGHT BEARING RESTRICTIONS: No  FALLS: Has patient fallen in last 6  months? No  LIVING ENVIRONMENT: Lives with: lives with their family  PLOF: Independent  PATIENT GOALS: get rid of symptoms  OBJECTIVE:   TODAY'S TREATMENT: 10/17/22 Activity Comments  Seated VOR x 1 Horizontal/vertical notes slight unsteadiness  Self Head-Impulse No symptoms and no slip noted  Fast head movements Horizontal/vertical, 0/10 symptoms  Fast body movements  Forward flexion/extension 180 degree turns, 0/10 symptoms           TODAY'S TREATMENT: 10/10/22 Activity Comments  Right DH No nystagmus, very brief fleeting sensation  Left DH No  nystagmus, no symptoms  Pt education C-spine posture education and positioning in car, mirrors, upright chin retractions.   VOR x 1 (seated) Horizontal x 30 sec 3/10 with instances of saccadic intrusions Vertical x 30 sec--no issues  Walking VOR x 1 Horizontal/vertical--reports minimal symptoms, demonstrates minimal pathway deviation  VOR x 2 Standing, demonstration and education on when to implement and scale of symptoms     DIAGNOSTIC FINDINGS: none for this episode  COGNITION: Overall cognitive status: Within functional limits for tasks assessed   SENSATION: WFL  EDEMA:    MUSCLE TONE:    DTRs:    POSTURE:  No Significant postural limitations  Cervical ROM:    Active A/PROM (deg) eval  Flexion WNL -felt symptoms in eyes  Extension WNL-symptoms  Right lateral flexion WNL  Left lateral flexion WNL  Right rotation WNL  Left rotation WNL  (Blank rows = not tested)  STRENGTH:   LOWER EXTREMITY MMT:   MMT Right eval Left eval  Hip flexion    Hip abduction    Hip adduction    Hip internal rotation    Hip external rotation    Knee flexion    Knee extension    Ankle dorsiflexion    Ankle plantarflexion    Ankle inversion    Ankle eversion    (Blank rows = not tested)  BED MOBILITY:  Independent  TRANSFERS: Independent  RAMP:   CURB:   GAIT: Gait pattern: WFL Distance walked:  Assistive device utilized: None Level of assistance: Complete Independence Comments:   FUNCTIONAL TESTS:    PATIENT SURVEYS:    VESTIBULAR ASSESSMENT:  GENERAL OBSERVATION:    SYMPTOM BEHAVIOR:  Subjective history: on/off issue over years  Non-Vestibular symptoms: neck pain, headaches, tinnitus, and migraine symptoms  Type of dizziness: Spinning/Vertigo  Frequency: position change  Duration: 10-30 sec  Aggravating factors: Induced by position change: bed mobility movements tend to trigger, bending over, looking up  Relieving factors: closing eyes and slow  movements  Progression of symptoms: unchanged  OCULOMOTOR EXAM:  Ocular Alignment: normal  Ocular ROM: No Limitations  Spontaneous Nystagmus: absent  Gaze-Induced Nystagmus: absent  Smooth Pursuits: saccades and intrusions with vertical direction  Saccades: intact  Convergence/Divergence: 4 cm , decreased convergence left eye   VESTIBULAR - OCULAR REFLEX:   Slow VOR: Comment: horizontal symptomatic after 2-3 rotations, vertical symptomatic  VOR Cancellation: Comment: normal motion, but motion sensitive  Head-Impulse Test: HIT Right: negative HIT Left: positive  Dynamic Visual Acuity: Not able to be assessed   POSITIONAL TESTING: Right Dix-Hallpike: no nystagmus Left Dix-Hallpike: no nystagmus Right Roll Test: no nystagmus Left Roll Test: no nystagmus Right Sidelying: no nystagmus Left Sidelying: no nystagmus  MOTION SENSITIVITY:  Motion Sensitivity Quotient Intensity: 0 = none, 1 = Lightheaded, 2 = Mild, 3 = Moderate, 4 = Severe, 5 = Vomiting  Intensity  1. Sitting to supine   2. Supine to L side  3. Supine to R side   4. Supine to sitting   5. L Hallpike-Dix   6. Up from L    7. R Hallpike-Dix   8. Up from R    9. Sitting, head tipped to L knee   10. Head up from L knee   11. Sitting, head tipped to R knee   12. Head up from R knee   13. Sitting head turns x5   14.Sitting head nods x5   15. In stance, 180 turn to L    16. In stance, 180 turn to R     OTHOSTATICS: not done  FUNCTIONAL GAIT:   M-CTSIB  Condition 1: Firm Surface, EO 30 Sec, Normal Sway  Condition 2: Firm Surface, EC 30 Sec, Normal and Mild Sway  Condition 3: Foam Surface, EO 30 Sec, Normal and Mild Sway  Condition 4: Foam Surface, EC 30 Sec, Moderate Sway     VESTIBULAR TREATMENT:                                                                                                   DATE: see below  Canalith Repositioning:  Comment: not indicated today Gaze Adaptation:  x1 Viewing  Horizontal: Position: seated/standing and x1 Viewing Vertical:  Position: seated/standing Habituation:  Brandt-Daroff: number of reps: 5 and Repeated Rolling: number of reps: 5 Other:   PATIENT EDUCATION: Education details: assessment findings Person educated: Patient Education method: Explanation Education comprehension: verbalized understanding    GOALS: Goals reviewed with patient? Yes  SHORT TERM GOALS: Target date: same as LTG    LONG TERM GOALS: Target date: 11/02/2022    The patient will be independent with HEP for gaze adaptation, habituation, balance, and general mobility. Baseline:  Goal status: MET  2.  Pt will report symptoms not exceeding 2/10 with movement to enable return to typical exercise routine Baseline: 4-8/10 with fast movements, bending over, etc Goal status: MET    ASSESSMENT:  CLINICAL IMPRESSION: Pt notes positional vertigo has mostly resolved and does not note any symptoms with fast head or body movements and no issue with positional changes such as lying down and arising. Notes symptoms are only provoked when he is standing in the soup aisle of grocery store and looking up to obtain a can, but this is ameliorated with tilting head down.  Patient is being treated concurrently by chiropractor for his cervical and TMJ issues and plans to continue with this regimen. Will hold PT services at this time due to goals met and no positional vertigo present.  OBJECTIVE IMPAIRMENTS: decreased balance and dizziness.   ACTIVITY LIMITATIONS: bending and locomotion level  PARTICIPATION LIMITATIONS: driving and exercise routine  PERSONAL FACTORS: Time since onset of injury/illness/exacerbation and 1-2 comorbidities: comorbid conditions--see hx  are also affecting patient's functional outcome.   REHAB POTENTIAL: Good  CLINICAL DECISION MAKING: Stable/uncomplicated  EVALUATION COMPLEXITY: Low   PLAN:  PT FREQUENCY: 1x/week  PT DURATION: 4  weeks  PLANNED INTERVENTIONS: Therapeutic exercises, Therapeutic activity, Neuromuscular re-education, Balance training, Gait training, Patient/Family education, Self Care, Joint mobilization, Vestibular training, Canalith  repositioning, Aquatic Therapy, Dry Needling, Spinal mobilization, Cryotherapy, Moist heat, Traction, and Manual therapy  PLAN FOR NEXT SESSION: re-assess positional symptoms if present, HEP review, D/C?   8:32 AM, 10/17/22 M. Sherlyn Lees, PT, DPT Physical Therapist- [place] Office Number: [phone]

## 2022-10-18 DIAGNOSIS — M9907 Segmental and somatic dysfunction of upper extremity: Secondary | ICD-10-CM | POA: Diagnosis not present

## 2022-10-18 DIAGNOSIS — M26621 Arthralgia of right temporomandibular joint: Secondary | ICD-10-CM | POA: Diagnosis not present

## 2022-10-18 DIAGNOSIS — M9901 Segmental and somatic dysfunction of cervical region: Secondary | ICD-10-CM | POA: Diagnosis not present

## 2022-10-18 DIAGNOSIS — M9903 Segmental and somatic dysfunction of lumbar region: Secondary | ICD-10-CM | POA: Diagnosis not present

## 2022-10-18 DIAGNOSIS — M9902 Segmental and somatic dysfunction of thoracic region: Secondary | ICD-10-CM | POA: Diagnosis not present

## 2022-10-18 DIAGNOSIS — M26601 Right temporomandibular joint disorder, unspecified: Secondary | ICD-10-CM | POA: Diagnosis not present

## 2022-10-24 ENCOUNTER — Emergency Department (HOSPITAL_BASED_OUTPATIENT_CLINIC_OR_DEPARTMENT_OTHER)
Admission: EM | Admit: 2022-10-24 | Discharge: 2022-10-24 | Disposition: A | Discharge status: Home / Self Care | Payer: 59 | Attending: Emergency Medicine | Admitting: Emergency Medicine

## 2022-10-24 ENCOUNTER — Encounter (HOSPITAL_BASED_OUTPATIENT_CLINIC_OR_DEPARTMENT_OTHER): Payer: Self-pay | Admitting: Emergency Medicine

## 2022-10-24 ENCOUNTER — Encounter: Payer: 59 | Admitting: Physical Therapy

## 2022-10-24 ENCOUNTER — Other Ambulatory Visit: Payer: Self-pay

## 2022-10-24 DIAGNOSIS — R110 Nausea: Secondary | ICD-10-CM | POA: Diagnosis not present

## 2022-10-24 DIAGNOSIS — R1084 Generalized abdominal pain: Secondary | ICD-10-CM | POA: Diagnosis not present

## 2022-10-24 DIAGNOSIS — R42 Dizziness and giddiness: Secondary | ICD-10-CM | POA: Insufficient documentation

## 2022-10-24 DIAGNOSIS — R1111 Vomiting without nausea: Secondary | ICD-10-CM | POA: Diagnosis not present

## 2022-10-24 HISTORY — DX: Dizziness and giddiness: R42

## 2022-10-24 MED ORDER — SODIUM CHLORIDE 0.9 % IV SOLN
12.5000 mg | Freq: Once | INTRAVENOUS | Status: AC
  Administered 2022-10-24: 12.5 mg via INTRAVENOUS
  Filled 2022-10-24: qty 0.5

## 2022-10-24 MED ORDER — DIAZEPAM 2 MG PO TABS: 5.0000 mg | ORAL_TABLET | Freq: Four times a day (QID) | ORAL | 0 refills | Status: DC | PRN

## 2022-10-24 MED ORDER — ONDANSETRON HCL 4 MG/2ML IJ SOLN
4.0000 mg | Freq: Once | INTRAMUSCULAR | Status: AC
  Administered 2022-10-24: 4 mg via INTRAVENOUS
  Filled 2022-10-24: qty 2

## 2022-10-24 MED ORDER — ONDANSETRON 8 MG PO TBDP: 8.0000 mg | ORAL_TABLET | Freq: Three times a day (TID) | ORAL | 0 refills | Status: DC | PRN

## 2022-10-24 MED ORDER — DIAZEPAM 5 MG/ML IJ SOLN
2.5000 mg | Freq: Once | INTRAMUSCULAR | Status: AC
  Administered 2022-10-24: 2.5 mg via INTRAVENOUS
  Filled 2022-10-24: qty 2

## 2022-10-24 MED ORDER — SODIUM CHLORIDE 0.9 % IV BOLUS
1000.0000 mL | Freq: Once | INTRAVENOUS | Status: AC
  Administered 2022-10-24: 1000 mL via INTRAVENOUS

## 2022-10-24 MED ORDER — DIPHENHYDRAMINE HCL 50 MG/ML IJ SOLN
25.0000 mg | Freq: Once | INTRAMUSCULAR | Status: AC
  Administered 2022-10-24: 25 mg via INTRAVENOUS
  Filled 2022-10-24: qty 1

## 2022-10-24 NOTE — ED Notes (Signed)
Pt verbalized understanding of dc instructions Cab voucher obtained for pt

## 2022-10-24 NOTE — ED Notes (Signed)
Pt received NS bolus per EMS

## 2022-10-24 NOTE — ED Notes (Signed)
Pt continues tro feel "queezy" and if he turns head to left or right he is dizzy EDP made aware

## 2022-10-24 NOTE — ED Provider Notes (Signed)
[facility] Provider Note: Lowella Dell, MD, FACEP  CSN: [id number] MRN: [id number] ARRIVAL: 10/24/22 at 0038 ROOM: DB015/DB015   CHIEF COMPLAINT  Vomiting   HISTORY OF PRESENT ILLNESS  10/24/22 1:05 AM Chris Robertson is a 51 y.o. male with a history of hearing impairment since age 7 and episodic vertigo for the past 2 years.  He usually takes meclizine daily.  He is here with an episode of vertigo that began about 10 PM.  He estimates he has vomited 10-20 times with this.  He does not believe he kept the meclizine down.  He had an associated sense of the room spinning as well as tinnitus.  Movement of his head exacerbated symptoms.  He was given IV fluids 500 mL/h by EMS prior to arrival but no medications.  He describes this is as a typical episode of his vertigo.   Past Medical History:  Diagnosis Date   Anxiety    Chicken pox    Depression    DOE (dyspnea on exertion) 10/11/2018   Sullivan Lone disease    Hearing impaired    age 7   Heart murmur    since childhood   Kidney stone    Mild mitral valve prolapse 2013   Mild MVP with mild MR by echo in 2013, read as trivial in 2019.   Near syncope 10/11/2018   Panic disorder 2015   With agoraphobia   Rapid heart beat 10/11/2018   Spermatocele 08/2012   hyrocele bilateral and spermatocele left; PSA normal 2013-2016   Thrombocytopenia (HCC)    mild, chronic   Vertigo    Vitamin D deficiency     Past Surgical History:  Procedure Laterality Date   CHOLECYSTECTOMY  2007   PECTUS CARNATUM REPAIR  1989   SHOULDER SURGERY  1997   TOOTH EXTRACTION     TRANSTHORACIC ECHOCARDIOGRAM  10/03/2018   Normal LV size and function.  EF 55 to 60%.  No regional wall motion normality.  Trivial late mitral prolapse with trivial regurgitation.  Also trivial AI, TR, PR.    Family History  Problem Relation Age of Onset   Mental illness Mother    Huntington's disease Mother    Depression Mother    Mental illness Father     Hypertension Father    Diabetes Father    Hyperlipidemia Father    Depression Father    Obesity Father    Cataracts Father    Mental illness Sister    Depression Sister    Mental illness Brother    Mental illness Maternal Grandmother    Pancreatic cancer Maternal Grandfather    Mental illness Maternal Grandfather    Mental illness Paternal Grandmother    Mental illness Paternal Grandfather    Thyroid cancer Maternal Aunt    Mental illness Maternal Aunt    Mental illness Maternal Uncle    Mental illness Paternal Aunt    Mental illness Paternal Uncle     Social History   Tobacco Use   Smoking status: Never   Smokeless tobacco: Never  Vaping Use   Vaping Use: Never used  Substance Use Topics   Alcohol use: Not Currently    Comment: Sober since September 2019   Drug use: No    Prior to Admission medications   Medication Sig Start Date End Date Taking? Authorizing Provider  diazepam (VALIUM) 2 MG tablet Take 2.5 tablets (5 mg total) by mouth every 6 (six) hours as needed (For vertigo). 10/24/22  Yes  Fany Cavanaugh, MD  ondansetron (ZOFRAN-ODT) 8 MG disintegrating tablet Take 1 tablet (8 mg total) by mouth every 8 (eight) hours as needed for nausea or vomiting. 10/24/22  Yes Dequann Vandervelden, MD    Allergies Patient has no known allergies.   REVIEW OF SYSTEMS  Negative except as noted here or in the History of Present Illness.   PHYSICAL EXAMINATION  Initial Vital Signs Blood pressure (!) 147/86, pulse 70, temperature 97.7 F (36.5 C), temperature source Oral, resp. rate (!) 21.  Examination General: Well-developed, well-nourished male in no acute distress; appearance consistent with age of record HENT: normocephalic; atraumatic; hearing aids Eyes: No nystagmus Neck: supple Heart: regular rate and rhythm Lungs: clear to auscultation bilaterally Abdomen: soft; nondistended; nontender; bowel sounds present Extremities: No deformity; full range of motion; pulses  normal Neurologic: Awake, alert and oriented; motor function intact in all extremities and symmetric; no facial droop; hard of hearing Skin: Warm and dry Psychiatric: Flat affect   RESULTS  Summary of this visit's results, reviewed and interpreted by myself:   EKG Interpretation  Date/Time:  Tuesday October 24 2022 00:44:26 EST Ventricular Rate:  75 PR Interval:  160 QRS Duration: 99 QT Interval:  433 QTC Calculation: 484 R Axis:   -61 Text Interpretation: Sinus rhythm LAD, consider left anterior fascicular block Low voltage, precordial leads RSR' in V1 or V2, right VCD or RVH Minimal ST elevation, lateral leads Borderline prolonged QT interval No significant change was found Confirmed by Paula Libra ([id number]) on 10/24/2022 1:06:34 AM       Laboratory Studies: No results found for this or any previous visit (from the past 24 hour(s)). Imaging Studies: No results found.  ED COURSE and MDM  Nursing notes, initial and subsequent vitals signs, including pulse oximetry, reviewed and interpreted by myself.  Vitals:   10/24/22 0200 10/24/22 0230 10/24/22 0300 10/24/22 0345  BP: 128/87 135/89 (!) 140/87 134/78  Pulse: 71 68 71 66  Resp: 17 13 (!) 23 16  Temp:      TempSrc:      SpO2: 100% 100% 100% 98%   Medications  ondansetron (ZOFRAN) injection 4 mg (4 mg Intravenous Given 10/24/22 0124)  diphenhydrAMINE (BENADRYL) injection 25 mg (25 mg Intravenous Given 10/24/22 0125)  sodium chloride 0.9 % bolus 1,000 mL (0 mLs Intravenous Stopped 10/24/22 0245)  promethazine (PHENERGAN) 12.5 mg in sodium chloride 0.9 % 50 mL IVPB (0 mg Intravenous Stopped 10/24/22 0305)  diazepam (VALIUM) injection 2.5 mg (2.5 mg Intravenous Given 10/24/22 0318)   4:07 AM Patient feeling better after multiple medications although he still has some vertigo with movement of his head.  He would like a referral to ENT as well as prescriptions for Valium and Zofran as he is not sure the meclizine is working  as well as it did in the past.  He has already been to neurorehab for his vertigo and can follow-up as needed.   PROCEDURES  Procedures   ED DIAGNOSES     ICD-10-CM   1. Vertigo  R42          Phillippa Straub, MD 10/24/22 [phone]

## 2022-10-24 NOTE — ED Triage Notes (Addendum)
Pt  via [facility] for vertigo  Started around 2200 Vomiting since, "reports this is like a normal episode"  Received 500 ml ns PTA

## 2022-10-24 NOTE — ED Notes (Signed)
0pt has been dealing with vertigo x 2 years, takes Meclizine OTC 12.5mg  BID, not helping this am.  Pt brought in by EMS and has received of NS.  Pt has seen several Mds and symptoms have not been relieved.  Pt is HOH and has hearing aids. VSS

## 2022-10-25 DIAGNOSIS — M26621 Arthralgia of right temporomandibular joint: Secondary | ICD-10-CM | POA: Diagnosis not present

## 2022-10-25 DIAGNOSIS — M26601 Right temporomandibular joint disorder, unspecified: Secondary | ICD-10-CM | POA: Diagnosis not present

## 2022-10-25 DIAGNOSIS — M9901 Segmental and somatic dysfunction of cervical region: Secondary | ICD-10-CM | POA: Diagnosis not present

## 2022-10-25 DIAGNOSIS — M9903 Segmental and somatic dysfunction of lumbar region: Secondary | ICD-10-CM | POA: Diagnosis not present

## 2022-10-25 DIAGNOSIS — M9907 Segmental and somatic dysfunction of upper extremity: Secondary | ICD-10-CM | POA: Diagnosis not present

## 2022-10-25 DIAGNOSIS — M9902 Segmental and somatic dysfunction of thoracic region: Secondary | ICD-10-CM | POA: Diagnosis not present

## 2022-10-31 ENCOUNTER — Encounter: Payer: Self-pay | Admitting: Physical Therapy

## 2022-11-01 ENCOUNTER — Ambulatory Visit: Payer: Commercial Managed Care - PPO | Attending: Physician Assistant

## 2022-11-01 ENCOUNTER — Ambulatory Visit
Admission: RE | Admit: 2022-11-01 | Discharge: 2022-11-01 | Disposition: A | Discharge status: Home / Self Care | Payer: Commercial Managed Care - PPO | Source: Ambulatory Visit | Attending: Chiropractic Medicine | Admitting: Chiropractic Medicine

## 2022-11-01 DIAGNOSIS — R42 Dizziness and giddiness: Secondary | ICD-10-CM

## 2022-11-01 DIAGNOSIS — H8111 Benign paroxysmal vertigo, right ear: Secondary | ICD-10-CM | POA: Diagnosis not present

## 2022-11-01 DIAGNOSIS — G540 Brachial plexus disorders: Secondary | ICD-10-CM

## 2022-11-01 DIAGNOSIS — M542 Cervicalgia: Secondary | ICD-10-CM

## 2022-11-01 DIAGNOSIS — R2231 Localized swelling, mass and lump, right upper limb: Secondary | ICD-10-CM | POA: Diagnosis not present

## 2022-11-01 NOTE — Therapy (Signed)
OUTPATIENT PHYSICAL THERAPY VESTIBULAR TREATMENT and D/C Summary     Patient Name: Chris Robertson MRN: [id number] DOB:[birth year], 52 y.o., male Today's Date: 11/01/2022  PHYSICAL THERAPY DISCHARGE SUMMARY  Visits from Start of Care: 4  Current functional level related to goals / functional outcomes: Pt has largely been free of positional vertigo whilst in clinic but continues to have sporadic episodes that have required medical intervention   Remaining deficits: Episodic events   Education / Equipment: HEP and self-assessment and relevant maneuvering for BPPV   Patient agrees to discharge. Patient goals were partially met. Patient is being discharged due to did not respond to therapy.   END OF SESSION:  PT End of Session - 11/01/22 0756     Visit Number 4    Number of Visits 4    Date for PT Re-Evaluation 11/02/22    Authorization Type [facility]    Authorization - Visit Number 16    PT Start Time 0800    PT Stop Time 0845    PT Time Calculation (min) 45 min             Past Medical History:  Diagnosis Date   Anxiety    Chicken pox    Depression    DOE (dyspnea on exertion) 10/11/2018   Rosanna Randy disease    Hearing impaired    age 18   Heart murmur    since childhood   Kidney stone    Mild mitral valve prolapse 2013   Mild MVP with mild MR by echo in 2013, read as trivial in 2019.   Near syncope 10/11/2018   Panic disorder 2015   With agoraphobia   Rapid heart beat 10/11/2018   Spermatocele 08/2012   hyrocele bilateral and spermatocele left; PSA normal 2013-2016   Thrombocytopenia (HCC)    mild, chronic   Vertigo    Vitamin D deficiency    Past Surgical History:  Procedure Laterality Date   CHOLECYSTECTOMY  2007   [place]   TOOTH EXTRACTION     TRANSTHORACIC ECHOCARDIOGRAM  10/03/2018   Normal LV size and function.  EF 55 to 60%.  No regional wall motion normality.  Trivial late mitral prolapse with trivial  regurgitation.  Also trivial AI, TR, PR.   Patient Active Problem List   Diagnosis Date Noted   Vertigo, intermittent 09/12/2020   Lactic acidemia 09/12/2020   Elevated ferritin 05/18/2020   Family history of hemochromatosis 05/18/2020   Family history of Huntington's disease 05/18/2020   Seasonal allergic rhinitis due to pollen 02/27/2019   Depression with anxiety 02/27/2019   Congenital hearing loss of both ears 05/23/2016   Hydrocele in adult 05/12/2016    PCP: Inda Coke, PA REFERRING PROVIDER: Inda Coke, PA  REFERRING DIAG: R42 (ICD-10-CM) - Vertigo, intermittent  THERAPY DIAG:  BPPV (benign paroxysmal positional vertigo), right  Dizziness and giddiness  Cervicalgia  ONSET DATE: most recent episode x 1-2 weeks ago  Rationale for Evaluation and Treatment: Rehabilitation  SUBJECTIVE:   SUBJECTIVE STATEMENT: On Saturday bent forward to pick up an item and felt something in right ear and then became very dizzy "eyes went wacko". Went to ER and they administered medications. Pt notes he went to sleep on right side and this seemed to aggravate symptoms.  Pt notes continued feeling of unsteadiness  Pt accompanied by: self  PERTINENT HISTORY: hx of trigeminal neuralgia  PAIN:  Are you having pain? Yes: NPRS  scale: 5/10 Pain location: C1-C2 posterior neck Pain description: sore Aggravating factors: activity, prolonged position Relieving factors: chiropractic  PRECAUTIONS: None  WEIGHT BEARING RESTRICTIONS: No  FALLS: Has patient fallen in last 6 months? No  LIVING ENVIRONMENT: Lives with: lives with their family  PLOF: Independent  PATIENT GOALS: get rid of symptoms  OBJECTIVE:   TODAY'S TREATMENT: 11/01/22 Activity Comments  Roll test Left/right no nystagmus, no/minimal symptoms  Dix-Hallpike left/right No nystagmus no symptoms  Pt education on self-assessment for BPPV               TODAY'S TREATMENT: 10/17/22 Activity Comments  Seated  VOR x 1 Horizontal/vertical notes slight unsteadiness  Self Head-Impulse No symptoms and no slip noted  Fast head movements Horizontal/vertical, 0/10 symptoms  Fast body movements  Forward flexion/extension 180 degree turns, 0/10 symptoms           TODAY'S TREATMENT: 10/10/22 Activity Comments  Right DH No nystagmus, very brief fleeting sensation  Left DH No nystagmus, no symptoms  Pt education C-spine posture education and positioning in car, mirrors, upright chin retractions.   VOR x 1 (seated) Horizontal x 30 sec 3/10 with instances of saccadic intrusions Vertical x 30 sec--no issues  Walking VOR x 1 Horizontal/vertical--reports minimal symptoms, demonstrates minimal pathway deviation  VOR x 2 Standing, demonstration and education on when to implement and scale of symptoms     DIAGNOSTIC FINDINGS: none for this episode  COGNITION: Overall cognitive status: Within functional limits for tasks assessed   SENSATION: WFL  EDEMA:    MUSCLE TONE:    DTRs:    POSTURE:  No Significant postural limitations  Cervical ROM:    Active A/PROM (deg) eval  Flexion WNL -felt symptoms in eyes  Extension WNL-symptoms  Right lateral flexion WNL  Left lateral flexion WNL  Right rotation WNL  Left rotation WNL  (Blank rows = not tested)  STRENGTH:   LOWER EXTREMITY MMT:   MMT Right eval Left eval  Hip flexion    Hip abduction    Hip adduction    Hip internal rotation    Hip external rotation    Knee flexion    Knee extension    Ankle dorsiflexion    Ankle plantarflexion    Ankle inversion    Ankle eversion    (Blank rows = not tested)  BED MOBILITY:  Independent  TRANSFERS: Independent  RAMP:   CURB:   GAIT: Gait pattern: WFL Distance walked:  Assistive device utilized: None Level of assistance: Complete Independence Comments:   FUNCTIONAL TESTS:    PATIENT SURVEYS:    VESTIBULAR ASSESSMENT:  GENERAL OBSERVATION:    SYMPTOM  BEHAVIOR:  Subjective history: on/off issue over years  Non-Vestibular symptoms: neck pain, headaches, tinnitus, and migraine symptoms  Type of dizziness: Spinning/Vertigo  Frequency: position change  Duration: 10-30 sec  Aggravating factors: Induced by position change: bed mobility movements tend to trigger, bending over, looking up  Relieving factors: closing eyes and slow movements  Progression of symptoms: unchanged  OCULOMOTOR EXAM:  Ocular Alignment: normal  Ocular ROM: No Limitations  Spontaneous Nystagmus: absent  Gaze-Induced Nystagmus: absent  Smooth Pursuits: saccades and intrusions with vertical direction  Saccades: intact  Convergence/Divergence: 4 cm , decreased convergence left eye   VESTIBULAR - OCULAR REFLEX:   Slow VOR: Comment: horizontal symptomatic after 2-3 rotations, vertical symptomatic  VOR Cancellation: Comment: normal motion, but motion sensitive  Head-Impulse Test: HIT Right: negative HIT Left: positive  Dynamic Visual Acuity: Not  able to be assessed   POSITIONAL TESTING: Right Dix-Hallpike: no nystagmus Left Dix-Hallpike: no nystagmus Right Roll Test: no nystagmus Left Roll Test: no nystagmus Right Sidelying: no nystagmus Left Sidelying: no nystagmus  MOTION SENSITIVITY:  Motion Sensitivity Quotient Intensity: 0 = none, 1 = Lightheaded, 2 = Mild, 3 = Moderate, 4 = Severe, 5 = Vomiting  Intensity  1. Sitting to supine   2. Supine to L side   3. Supine to R side   4. Supine to sitting   5. L Hallpike-Dix   6. Up from L    7. R Hallpike-Dix   8. Up from R    9. Sitting, head tipped to L knee   10. Head up from L knee   11. Sitting, head tipped to R knee   12. Head up from R knee   13. Sitting head turns x5   14.Sitting head nods x5   15. In stance, 180 turn to L    16. In stance, 180 turn to R     OTHOSTATICS: not done  FUNCTIONAL GAIT:   M-CTSIB  Condition 1: Firm Surface, EO 30 Sec, Normal Sway  Condition 2: Firm Surface, EC  30 Sec, Normal and Mild Sway  Condition 3: Foam Surface, EO 30 Sec, Normal and Mild Sway  Condition 4: Foam Surface, EC 30 Sec, Moderate Sway     VESTIBULAR TREATMENT:                                                                                                   DATE: see below  Canalith Repositioning:  Comment: not indicated today Gaze Adaptation:  x1 Viewing Horizontal: Position: seated/standing and x1 Viewing Vertical:  Position: seated/standing Habituation:  Brandt-Daroff: number of reps: 5 and Repeated Rolling: number of reps: 5 Other:   PATIENT EDUCATION: Education details: assessment findings Person educated: Patient Education method: Explanation Education comprehension: verbalized understanding    GOALS: Goals reviewed with patient? Yes  SHORT TERM GOALS: Target date: same as LTG    LONG TERM GOALS: Target date: 11/02/2022    The patient will be independent with HEP for gaze adaptation, habituation, balance, and general mobility. Baseline:  Goal status: MET  2.  Pt will report symptoms not exceeding 2/10 with movement to enable return to typical exercise routine Baseline: 4-8/10 with fast movements, bending over, etc Goal status: MET    ASSESSMENT:  CLINICAL IMPRESSION: Pt returns to clinic after experiencing an acute positional vertigo attack this past weekend which required trip to ER.  Pt reports he also attempted self positioning at home by way of Brandt-Daroff.  Re-assessment this AM reveals no nystagmus and no to minimal symptoms with positional tests and only notable symptom occurring when sitting up to long-sitting from test positions.  Patient's symptoms have been quite pervasive and notes issues of popping and pressure in his ear that precedes a lot of these events.  This therapist is unclear as to the underlying issue causing patient symptoms but he has largely been free of positional symptoms and minimal deficits/limitations noted with VOR.  Recommend to patient that he follow-up with ENT/neurologist for further assessment  OBJECTIVE IMPAIRMENTS: decreased balance and dizziness.   ACTIVITY LIMITATIONS: bending and locomotion level  PARTICIPATION LIMITATIONS: driving and exercise routine  PERSONAL FACTORS: Time since onset of injury/illness/exacerbation and 1-2 comorbidities: comorbid conditions--see hx  are also affecting patient's functional outcome.   REHAB POTENTIAL: Good  CLINICAL DECISION MAKING: Stable/uncomplicated  EVALUATION COMPLEXITY: Low   PLAN:  PT FREQUENCY: 1x/week  PT DURATION: 4 weeks  PLANNED INTERVENTIONS: Therapeutic exercises, Therapeutic activity, Neuromuscular re-education, Balance training, Gait training, Patient/Family education, Self Care, Joint mobilization, Vestibular training, Canalith repositioning, Aquatic Therapy, Dry Needling, Spinal mobilization, Cryotherapy, Moist heat, Traction, and Manual therapy  PLAN FOR NEXT SESSION: D/C to self-management recommend f/u with ENT/neurologist   7:56 AM, 11/01/22 M. Sherlyn Lees, PT, DPT Physical Therapist- [place] Office Number: [phone]

## 2022-11-02 ENCOUNTER — Encounter: Payer: Self-pay | Admitting: Physician Assistant

## 2022-11-03 ENCOUNTER — Other Ambulatory Visit: Payer: Self-pay | Admitting: Physician Assistant

## 2022-11-03 DIAGNOSIS — M9903 Segmental and somatic dysfunction of lumbar region: Secondary | ICD-10-CM | POA: Diagnosis not present

## 2022-11-03 DIAGNOSIS — M26601 Right temporomandibular joint disorder, unspecified: Secondary | ICD-10-CM | POA: Diagnosis not present

## 2022-11-03 DIAGNOSIS — M9901 Segmental and somatic dysfunction of cervical region: Secondary | ICD-10-CM | POA: Diagnosis not present

## 2022-11-03 DIAGNOSIS — R42 Dizziness and giddiness: Secondary | ICD-10-CM

## 2022-11-03 DIAGNOSIS — M9902 Segmental and somatic dysfunction of thoracic region: Secondary | ICD-10-CM | POA: Diagnosis not present

## 2022-11-03 DIAGNOSIS — M26621 Arthralgia of right temporomandibular joint: Secondary | ICD-10-CM | POA: Diagnosis not present

## 2022-11-03 DIAGNOSIS — M9907 Segmental and somatic dysfunction of upper extremity: Secondary | ICD-10-CM | POA: Diagnosis not present

## 2022-11-08 DIAGNOSIS — R42 Dizziness and giddiness: Secondary | ICD-10-CM | POA: Diagnosis not present

## 2022-11-08 DIAGNOSIS — M9901 Segmental and somatic dysfunction of cervical region: Secondary | ICD-10-CM | POA: Diagnosis not present

## 2022-11-08 DIAGNOSIS — M9903 Segmental and somatic dysfunction of lumbar region: Secondary | ICD-10-CM | POA: Diagnosis not present

## 2022-11-08 DIAGNOSIS — M9902 Segmental and somatic dysfunction of thoracic region: Secondary | ICD-10-CM | POA: Diagnosis not present

## 2022-11-17 DIAGNOSIS — M9902 Segmental and somatic dysfunction of thoracic region: Secondary | ICD-10-CM | POA: Diagnosis not present

## 2022-11-17 DIAGNOSIS — M9903 Segmental and somatic dysfunction of lumbar region: Secondary | ICD-10-CM | POA: Diagnosis not present

## 2022-11-17 DIAGNOSIS — M9901 Segmental and somatic dysfunction of cervical region: Secondary | ICD-10-CM | POA: Diagnosis not present

## 2022-11-20 DIAGNOSIS — M9901 Segmental and somatic dysfunction of cervical region: Secondary | ICD-10-CM | POA: Diagnosis not present

## 2022-11-20 DIAGNOSIS — M9900 Segmental and somatic dysfunction of head region: Secondary | ICD-10-CM | POA: Diagnosis not present

## 2022-11-20 DIAGNOSIS — M9903 Segmental and somatic dysfunction of lumbar region: Secondary | ICD-10-CM | POA: Diagnosis not present

## 2022-11-20 DIAGNOSIS — M9902 Segmental and somatic dysfunction of thoracic region: Secondary | ICD-10-CM | POA: Diagnosis not present

## 2022-11-27 DIAGNOSIS — M9902 Segmental and somatic dysfunction of thoracic region: Secondary | ICD-10-CM | POA: Diagnosis not present

## 2022-11-27 DIAGNOSIS — M9901 Segmental and somatic dysfunction of cervical region: Secondary | ICD-10-CM | POA: Diagnosis not present

## 2022-11-27 DIAGNOSIS — M9903 Segmental and somatic dysfunction of lumbar region: Secondary | ICD-10-CM | POA: Diagnosis not present

## 2022-11-27 DIAGNOSIS — M26621 Arthralgia of right temporomandibular joint: Secondary | ICD-10-CM | POA: Diagnosis not present

## 2022-11-27 DIAGNOSIS — M9907 Segmental and somatic dysfunction of upper extremity: Secondary | ICD-10-CM | POA: Diagnosis not present

## 2022-11-27 DIAGNOSIS — M26601 Right temporomandibular joint disorder, unspecified: Secondary | ICD-10-CM | POA: Diagnosis not present

## 2022-12-04 DIAGNOSIS — M26621 Arthralgia of right temporomandibular joint: Secondary | ICD-10-CM | POA: Diagnosis not present

## 2022-12-04 DIAGNOSIS — M9907 Segmental and somatic dysfunction of upper extremity: Secondary | ICD-10-CM | POA: Diagnosis not present

## 2022-12-04 DIAGNOSIS — M9903 Segmental and somatic dysfunction of lumbar region: Secondary | ICD-10-CM | POA: Diagnosis not present

## 2022-12-04 DIAGNOSIS — M26601 Right temporomandibular joint disorder, unspecified: Secondary | ICD-10-CM | POA: Diagnosis not present

## 2022-12-04 DIAGNOSIS — M9902 Segmental and somatic dysfunction of thoracic region: Secondary | ICD-10-CM | POA: Diagnosis not present

## 2022-12-04 DIAGNOSIS — M9901 Segmental and somatic dysfunction of cervical region: Secondary | ICD-10-CM | POA: Diagnosis not present

## 2022-12-11 DIAGNOSIS — M9902 Segmental and somatic dysfunction of thoracic region: Secondary | ICD-10-CM | POA: Diagnosis not present

## 2022-12-11 DIAGNOSIS — M9901 Segmental and somatic dysfunction of cervical region: Secondary | ICD-10-CM | POA: Diagnosis not present

## 2022-12-11 DIAGNOSIS — M9903 Segmental and somatic dysfunction of lumbar region: Secondary | ICD-10-CM | POA: Diagnosis not present

## 2022-12-18 DIAGNOSIS — H818X1 Other disorders of vestibular function, right ear: Secondary | ICD-10-CM | POA: Diagnosis not present

## 2023-01-29 ENCOUNTER — Encounter: Payer: Self-pay | Admitting: Physician Assistant

## 2023-01-29 ENCOUNTER — Ambulatory Visit: Payer: Commercial Managed Care - PPO | Admitting: Physician Assistant

## 2023-01-29 VITALS — BP 110/80 | HR 72 | Temp 97.3°F | Ht 74.0 in | Wt 212.4 lb

## 2023-01-29 DIAGNOSIS — H832X1 Labyrinthine dysfunction, right ear: Secondary | ICD-10-CM | POA: Insufficient documentation

## 2023-01-29 DIAGNOSIS — R42 Dizziness and giddiness: Secondary | ICD-10-CM | POA: Diagnosis not present

## 2023-01-29 DIAGNOSIS — T50905A Adverse effect of unspecified drugs, medicaments and biological substances, initial encounter: Secondary | ICD-10-CM | POA: Diagnosis not present

## 2023-01-29 LAB — COMPREHENSIVE METABOLIC PANEL
ALT: 43 U/L (ref 0–53)
AST: 32 U/L (ref 0–37)
Albumin: 4.5 g/dL (ref 3.5–5.2)
Alkaline Phosphatase: 104 U/L (ref 39–117)
BUN: 18 mg/dL (ref 6–23)
CO2: 27 mEq/L (ref 19–32)
Calcium: 9.8 mg/dL (ref 8.4–10.5)
Chloride: 106 mEq/L (ref 96–112)
Creatinine, Ser: 1.04 mg/dL (ref 0.40–1.50)
GFR: 83.11 mL/min (ref >60.00)
Glucose, Bld: 71 mg/dL (ref 70–99)
Potassium: 3.9 mEq/L (ref 3.5–5.1)
Sodium: 137 mEq/L (ref 135–145)
Total Bilirubin: 1.4 mg/dL — ABNORMAL HIGH (ref 0.2–1.2)
Total Protein: 6.8 g/dL (ref 6.0–8.3)

## 2023-01-29 LAB — CBC WITH DIFFERENTIAL/PLATELET
Basophils Absolute: 0.1 10*3/uL (ref 0.0–0.1)
Basophils Relative: 0.6 % (ref 0.0–3.0)
Eosinophils Absolute: 0.1 10*3/uL (ref 0.0–0.7)
Eosinophils Relative: 0.9 % (ref 0.0–5.0)
HCT: 47.1 % (ref 39.0–52.0)
Hemoglobin: 16.6 g/dL (ref 13.0–17.0)
Lymphocytes Relative: 18 % (ref 12.0–46.0)
Lymphs Abs: 1.6 10*3/uL (ref 0.7–4.0)
MCHC: 35.4 g/dL (ref 30.0–36.0)
MCV: 91.4 fl (ref 78.0–100.0)
Monocytes Absolute: 1 10*3/uL (ref 0.1–1.0)
Monocytes Relative: 10.9 % (ref 3.0–12.0)
Neutro Abs: 6.1 10*3/uL (ref 1.4–7.7)
Neutrophils Relative %: 69.6 % (ref 43.0–77.0)
Platelets: 197 10*3/uL (ref 150.0–400.0)
RBC: 5.15 Mil/uL (ref 4.22–5.81)
RDW: 13 % (ref 11.5–15.5)
WBC: 8.8 10*3/uL (ref 4.0–10.5)

## 2023-01-29 NOTE — Patient Instructions (Signed)
It was great to see you!  I will be in touch with your results and plan after I talk to our PT.  Take care,  Inda Coke PA-C

## 2023-01-29 NOTE — Progress Notes (Signed)
Chris Robertson is a 52 y.o. male here for a follow up of a pre-existing problem.  History of Present Illness:   Chief Complaint  Patient presents with   lip issue    Pt c/o upper lip area red and lips burning, thinks reaction to Meclizine, he stopped it on Tuesday last week and since the sensation has improved and redness is gone.     HPI  Medication Reaction Pt c/o upper lip area red and lips burning, thinks reaction to meclizine, Last visit to ER went up to 2-3 full tablets of meclizine per ER doc's recommendation. Since that time, he has noticed significantly increased redness in upper and lower lips Burning sensation when did mouthwash on inner lips  He then reduced to 12.5 mg twice daily and had some slight improvement of symptoms Tuesday of last week stopped meclizine all together Past few days, his lip color has returned to normal, burning has stopped  Feels like his body temperature has been "wacky" since Tuesday    Vertigo and TMJ Continues to see Dr Susie Cassette with [place] chiropractor about 2-3 days per week; he feels like this has been the most beneficial treatment for him Has seen [facility] and feels like this was not as helpful as needed  He feels as though his TMJ dysfunction is likely contributing to vertigo spells    Past Medical History:  Diagnosis Date   Anxiety    Chicken pox    Depression    DOE (dyspnea on exertion) 10/11/2018   Rosanna Randy disease    Hearing impaired    age 11   Heart murmur    since childhood   Kidney stone    Mild mitral valve prolapse 2013   Mild MVP with mild MR by echo in 2013, read as trivial in 2019.   Near syncope 10/11/2018   Panic disorder 2015   With agoraphobia   Rapid heart beat 10/11/2018   Spermatocele 08/2012   hyrocele bilateral and spermatocele left; PSA normal 2013-2016   Thrombocytopenia    mild, chronic   Vertigo    Vitamin D deficiency      Social History   Tobacco Use   Smoking status: Never    Smokeless tobacco: Never  Vaping Use   Vaping Use: Never used  Substance Use Topics   Alcohol use: Not Currently    Comment: Sober since September 2019   Drug use: No    Past Surgical History:  Procedure Laterality Date   CHOLECYSTECTOMY  2007   [place]   TOOTH EXTRACTION     TRANSTHORACIC ECHOCARDIOGRAM  10/03/2018   Normal LV size and function.  EF 55 to 60%.  No regional wall motion normality.  Trivial late mitral prolapse with trivial regurgitation.  Also trivial AI, TR, PR.    Family History  Problem Relation Age of Onset   Mental illness Mother    Huntington's disease Mother    Depression Mother    Mental illness Father    Hypertension Father    Diabetes Father    Hyperlipidemia Father    Depression Father    Obesity Father    Cataracts Father    Mental illness Sister    Depression Sister    Mental illness Brother    Mental illness Maternal Grandmother    Pancreatic cancer Maternal Grandfather    Mental illness Maternal Grandfather    Mental illness Paternal Grandmother  Mental illness Paternal Grandfather    Thyroid cancer Maternal Aunt    Mental illness Maternal Aunt    Mental illness Maternal Uncle    Mental illness Paternal Aunt    Mental illness Paternal Uncle     No Known Allergies  Current Medications:   Current Outpatient Medications:    Cholecalciferol 50 MCG (2000 UT) CAPS, Take 1 capsule by mouth daily in the afternoon., Disp: , Rfl:    Review of Systems:   ROS Negative unless otherwise specified per HPI.  Vitals:   Vitals:   01/29/23 0815  BP: 110/80  Pulse: 72  Temp: (!) 97.3 F (36.3 C)  TempSrc: Temporal  SpO2: 99%  Weight: 212 lb 6.1 oz (96.3 kg)  Height: 6\' 2"  (1.88 m)     Body mass index is 27.27 kg/m.  Physical Exam:   Physical Exam Vitals and nursing note reviewed.  Constitutional:      Appearance: He is well-developed.  HENT:     Head: Normocephalic.  Eyes:      Conjunctiva/sclera: Conjunctivae normal.     Pupils: Pupils are equal, round, and reactive to light.  Pulmonary:     Effort: Pulmonary effort is normal.  Musculoskeletal:        General: Normal range of motion.     Cervical back: Normal range of motion.  Skin:    General: Skin is warm and dry.  Neurological:     Mental Status: He is alert and oriented to person, place, and time.  Psychiatric:        Behavior: Behavior normal.        Thought Content: Thought content normal.        Judgment: Judgment normal.     Assessment and Plan:   Vertigo, intermittent No red flags Continue efforts at working with chiro Will trial to reach out to my PT colleague to see if there are additional options for him locally such as craniofacial pain specialist and will reach out to patient if any recommendations  Adverse effect of drug, initial encounter Symptoms seemed to have resolved with discontinuation of Meclizine Continue to monitor Will update blood work CBC and CMP  Inda Coke, PA-C

## 2023-01-30 ENCOUNTER — Encounter: Payer: Self-pay | Admitting: Physician Assistant

## 2023-01-30 ENCOUNTER — Other Ambulatory Visit: Payer: Self-pay | Admitting: Physician Assistant

## 2023-01-30 DIAGNOSIS — R6884 Jaw pain: Secondary | ICD-10-CM

## 2023-03-06 DIAGNOSIS — M26602 Left temporomandibular joint disorder, unspecified: Secondary | ICD-10-CM | POA: Diagnosis not present

## 2023-03-06 DIAGNOSIS — M26601 Right temporomandibular joint disorder, unspecified: Secondary | ICD-10-CM | POA: Diagnosis not present

## 2023-03-06 DIAGNOSIS — S161XXS Strain of muscle, fascia and tendon at neck level, sequela: Secondary | ICD-10-CM | POA: Diagnosis not present

## 2024-04-05 ENCOUNTER — Emergency Department (HOSPITAL_BASED_OUTPATIENT_CLINIC_OR_DEPARTMENT_OTHER)
Admission: EM | Admit: 2024-04-05 | Discharge: 2024-04-06 | Disposition: A | Discharge status: Home / Self Care | Payer: Self-pay | Attending: Emergency Medicine | Admitting: Emergency Medicine

## 2024-04-05 ENCOUNTER — Encounter (HOSPITAL_BASED_OUTPATIENT_CLINIC_OR_DEPARTMENT_OTHER): Payer: Self-pay

## 2024-04-05 ENCOUNTER — Other Ambulatory Visit: Payer: Self-pay

## 2024-04-05 DIAGNOSIS — R197 Diarrhea, unspecified: Secondary | ICD-10-CM | POA: Diagnosis not present

## 2024-04-05 DIAGNOSIS — R1084 Generalized abdominal pain: Secondary | ICD-10-CM

## 2024-04-05 DIAGNOSIS — R748 Abnormal levels of other serum enzymes: Secondary | ICD-10-CM | POA: Insufficient documentation

## 2024-04-05 DIAGNOSIS — D72829 Elevated white blood cell count, unspecified: Secondary | ICD-10-CM | POA: Insufficient documentation

## 2024-04-05 DIAGNOSIS — R112 Nausea with vomiting, unspecified: Secondary | ICD-10-CM

## 2024-04-05 NOTE — ED Triage Notes (Signed)
 BIB EMS/ ate cereal around 2130 and started vomiting around 2230 this evening/ also c/o diarrhea and left flank pain/tightness/ pt is A&Ox4/ ambulatory

## 2024-04-06 ENCOUNTER — Emergency Department (HOSPITAL_BASED_OUTPATIENT_CLINIC_OR_DEPARTMENT_OTHER)

## 2024-04-06 LAB — URINALYSIS, W/ REFLEX TO CULTURE (INFECTION SUSPECTED)
Bacteria, UA: NONE SEEN
Bilirubin Urine: NEGATIVE
Glucose, UA: NEGATIVE mg/dL
Hgb urine dipstick: NEGATIVE
Ketones, ur: 40 mg/dL — AB
Leukocytes,Ua: NEGATIVE
Nitrite: NEGATIVE
Protein, ur: 30 mg/dL — AB
Specific Gravity, Urine: 1.031 — ABNORMAL HIGH (ref 1.005–1.030)
pH: 5.5 (ref 5.0–8.0)

## 2024-04-06 LAB — COMPREHENSIVE METABOLIC PANEL WITH GFR
ALT: 33 U/L (ref 0–44)
AST: 32 U/L (ref 15–41)
Albumin: 4.7 g/dL (ref 3.5–5.0)
Alkaline Phosphatase: 130 U/L — ABNORMAL HIGH (ref 38–126)
Anion gap: 14 (ref 5–15)
BUN: 20 mg/dL (ref 6–20)
CO2: 24 mmol/L (ref 22–32)
Calcium: 9.9 mg/dL (ref 8.9–10.3)
Chloride: 102 mmol/L (ref 98–111)
Creatinine, Ser: 1.12 mg/dL (ref 0.61–1.24)
GFR, Estimated: >60 mL/min (ref >60)
Glucose, Bld: 126 mg/dL — ABNORMAL HIGH (ref 70–99)
Potassium: 4.1 mmol/L (ref 3.5–5.1)
Sodium: 140 mmol/L (ref 135–145)
Total Bilirubin: 1.4 mg/dL — ABNORMAL HIGH (ref 0.0–1.2)
Total Protein: 7.6 g/dL (ref 6.5–8.1)

## 2024-04-06 LAB — CBC WITH DIFFERENTIAL/PLATELET
Abs Immature Granulocytes: 0.11 10*3/uL — ABNORMAL HIGH (ref 0.00–0.07)
Basophils Absolute: 0.1 10*3/uL (ref 0.0–0.1)
Basophils Relative: 0 %
Eosinophils Absolute: 0.1 10*3/uL (ref 0.0–0.5)
Eosinophils Relative: 0 %
HCT: 51.6 % (ref 39.0–52.0)
Hemoglobin: 18.6 g/dL — ABNORMAL HIGH (ref 13.0–17.0)
Immature Granulocytes: 1 %
Lymphocytes Relative: 6 %
Lymphs Abs: 1.2 10*3/uL (ref 0.7–4.0)
MCH: 32.8 pg (ref 26.0–34.0)
MCHC: 36 g/dL (ref 30.0–36.0)
MCV: 91 fL (ref 80.0–100.0)
Monocytes Absolute: 1.5 10*3/uL — ABNORMAL HIGH (ref 0.1–1.0)
Monocytes Relative: 7 %
Neutro Abs: 17.8 10*3/uL — ABNORMAL HIGH (ref 1.7–7.7)
Neutrophils Relative %: 86 %
Platelets: 208 10*3/uL (ref 150–400)
RBC: 5.67 MIL/uL (ref 4.22–5.81)
RDW: 12.7 % (ref 11.5–15.5)
WBC: 20.7 10*3/uL — ABNORMAL HIGH (ref 4.0–10.5)
nRBC: 0 % (ref 0.0–0.2)

## 2024-04-06 LAB — LIPASE, BLOOD: Lipase: 23 U/L (ref 11–51)

## 2024-04-06 MED ORDER — HYOSCYAMINE SULFATE 0.125 MG SL SUBL
0.1250 mg | SUBLINGUAL_TABLET | Freq: Four times a day (QID) | SUBLINGUAL | 0 refills | Status: AC | PRN
  Filled 2024-04-06: qty 30, 8d supply, fill #0

## 2024-04-06 MED ORDER — SODIUM CHLORIDE 0.9 % IV BOLUS
1000.0000 mL | Freq: Once | INTRAVENOUS | Status: AC
  Administered 2024-04-06: 1000 mL via INTRAVENOUS

## 2024-04-06 MED ORDER — ONDANSETRON 4 MG PO TBDP
4.0000 mg | ORAL_TABLET | Freq: Three times a day (TID) | ORAL | 0 refills | Status: AC | PRN
  Filled 2024-04-06: qty 9, 3d supply, fill #0

## 2024-04-06 MED ORDER — METOCLOPRAMIDE HCL 5 MG/ML IJ SOLN
10.0000 mg | Freq: Once | INTRAMUSCULAR | Status: AC
  Administered 2024-04-06: 10 mg via INTRAVENOUS
  Filled 2024-04-06: qty 2

## 2024-04-06 MED ORDER — HYDROMORPHONE HCL 1 MG/ML IJ SOLN: 0.5000 mg | INTRAMUSCULAR | Status: DC | PRN

## 2024-04-06 MED ORDER — PROCHLORPERAZINE EDISYLATE 10 MG/2ML IJ SOLN
10.0000 mg | Freq: Once | INTRAMUSCULAR | Status: AC
  Administered 2024-04-06: 10 mg via INTRAVENOUS
  Filled 2024-04-06: qty 2

## 2024-04-06 NOTE — ED Provider Notes (Signed)
 [facility] AT [facility] Provider Note  CSN: [id number] Arrival date & time: 04/05/24 2340  Chief Complaint(s) Emesis  HPI Elic [name] is a 53 y.o. male with a past medical history listed below here for generalized abdominal discomfort with associated several bouts of nausea and nonbloody nonbilious episodes after eating cereal this evening. Reports diarrhea as well.  Denies any suspicious food intake.  No recent sick contacts.   Emesis   Past Medical History Past Medical History:  Diagnosis Date   Anxiety    Chicken pox    Depression    DOE (dyspnea on exertion) 10/11/2018   Oletta Berry disease    Hearing impaired    age 36   Heart murmur    since childhood   Kidney stone    Mild mitral valve prolapse 2013   Mild MVP with mild MR by echo in 2013, read as trivial in 2019.   Near syncope 10/11/2018   Panic disorder 2015   With agoraphobia   Rapid heart beat 10/11/2018   Spermatocele 08/2012   hyrocele bilateral and spermatocele left; PSA normal 2013-2016   Thrombocytopenia (HCC)    mild, chronic   Vertigo    Vitamin D  deficiency    Patient Active Problem List   Diagnosis Date Noted   Vestibular hypofunction, right 01/29/2023   Vertigo, intermittent 09/12/2020   Lactic acidemia 09/12/2020   Elevated ferritin 05/18/2020   Family history of hemochromatosis 05/18/2020   Family history of Huntington's disease 05/18/2020   Seasonal allergic rhinitis due to pollen 02/27/2019   Depression with anxiety 02/27/2019   Congenital hearing loss of both ears 05/23/2016   Hydrocele in adult 05/12/2016   Home Medication(s) Prior to Admission medications   Medication Sig Start Date End Date Taking? Authorizing Provider  hyoscyamine (LEVSIN/SL) 0.125 MG SL tablet Place 1 tablet (0.125 mg total) under the tongue 4 (four) times daily as needed for up to 5 days. 04/06/24 04/11/24 Yes Arleny Kruger, Camila Cecil, MD  ondansetron  (ZOFRAN -ODT) 4 MG disintegrating  tablet Take 1 tablet (4 mg total) by mouth every 8 (eight) hours as needed for up to 3 days for nausea or vomiting. 04/06/24 04/09/24 Yes Zaedyn Covin, Camila Cecil, MD  Cholecalciferol 50 MCG (2000 UT) CAPS Take 1 capsule by mouth daily in the afternoon. 07/18/19   [provider]                                                                                                                                    Allergies Patient has no known allergies.  Review of Systems Review of Systems  Gastrointestinal:  Positive for vomiting.   As noted in HPI  Physical Exam Vital Signs  I have reviewed the triage vital signs BP (!) 128/97 (BP Location: Left Arm)   Pulse 88   Temp 98 F (36.7 C) (Oral)   Resp 16   Wt 98 kg  SpO2 100%   BMI 27.73 kg/m   Physical Exam Vitals reviewed.  Constitutional:      General: He is not in acute distress.    Appearance: He is well-developed. He is not diaphoretic.  HENT:     Head: Normocephalic and atraumatic.     Right Ear: External ear normal.     Left Ear: External ear normal.     Nose: Nose normal.     Mouth/Throat:     Mouth: Mucous membranes are moist.  Eyes:     General: No scleral icterus.    Conjunctiva/sclera: Conjunctivae normal.  Neck:     Trachea: Phonation normal.  Cardiovascular:     Rate and Rhythm: Normal rate and regular rhythm.  Pulmonary:     Effort: Pulmonary effort is normal. No respiratory distress.     Breath sounds: No stridor.  Abdominal:     General: There is no distension.     Tenderness: There is generalized abdominal tenderness. There is no guarding or rebound.  Musculoskeletal:        General: Normal range of motion.     Cervical back: Normal range of motion.  Neurological:     Mental Status: He is alert and oriented to person, place, and time.  Psychiatric:        Behavior: Behavior normal.     ED Results and Treatments Labs (all labs ordered are listed, but only abnormal results are  displayed) Labs Reviewed  COMPREHENSIVE METABOLIC PANEL WITH GFR - Abnormal; Notable for the following components:      Result Value   Glucose, Bld 126 (*)    Alkaline Phosphatase 130 (*)    Total Bilirubin 1.4 (*)    All other components within normal limits  CBC WITH DIFFERENTIAL/PLATELET - Abnormal; Notable for the following components:   WBC 20.7 (*)    Hemoglobin 18.6 (*)    Neutro Abs 17.8 (*)    Monocytes Absolute 1.5 (*)    Abs Immature Granulocytes 0.11 (*)    All other components within normal limits  URINALYSIS, W/ REFLEX TO CULTURE (INFECTION SUSPECTED) - Abnormal; Notable for the following components:   Specific Gravity, Urine 1.031 (*)    Ketones, ur 40 (*)    Protein, ur 30 (*)    All other components within normal limits  LIPASE, BLOOD                                                                                                                         EKG  EKG Interpretation Date/Time:    Ventricular Rate:    PR Interval:    QRS Duration:    QT Interval:    QTC Calculation:   R Axis:      Text Interpretation:         Radiology CT Renal Stone Study Result Date: 04/06/2024 CLINICAL DATA:  Vomiting and abdominal pain, initial encounter EXAM: CT ABDOMEN AND PELVIS WITHOUT CONTRAST TECHNIQUE: Multidetector CT imaging of  the abdomen and pelvis was performed following the standard protocol without IV contrast. RADIATION DOSE REDUCTION: This exam was performed according to the departmental dose-optimization program which includes automated exposure control, adjustment of the mA and/or kV according to patient size and/or use of iterative reconstruction technique. COMPARISON:  None Available. FINDINGS: Lower chest: No acute abnormality. Hepatobiliary: No focal liver abnormality is seen. Status post cholecystectomy. No biliary dilatation. Pancreas: Unremarkable. No pancreatic ductal dilatation or surrounding inflammatory changes. Spleen: Normal in size without focal  abnormality. Adrenals/Urinary Tract: Adrenal glands are within normal limits. Kidneys are well visualized bilaterally. No renal calculi or obstructive changes are seen. The bladder is decompressed. Stomach/Bowel: No obstructive or inflammatory changes of the colon are seen. The appendix is within normal limits. Stomach and small bowel are within normal limits. Vascular/Lymphatic: No significant vascular findings are present. No enlarged abdominal or pelvic lymph nodes. Reproductive: Prostate is unremarkable. Other: No abdominal wall hernia or abnormality. No abdominopelvic ascites. Musculoskeletal: No acute or significant osseous findings. IMPRESSION: No acute abnormality correspond with the given clinical history Electronically Signed   By: Violeta Grey M.D.   On: 04/06/2024 01:32    Medications Ordered in ED Medications  HYDROmorphone (DILAUDID) injection 0.5 mg (has no administration in time range)  sodium chloride  0.9 % bolus 1,000 mL (0 mLs Intravenous Stopped 04/06/24 0200)  prochlorperazine (COMPAZINE) injection 10 mg (10 mg Intravenous Given 04/06/24 0024)  metoCLOPramide (REGLAN) injection 10 mg (10 mg Intravenous Given 04/06/24 0210)   Procedures Procedures  (including critical care time) Medical Decision Making / ED Course   Medical Decision Making Amount and/or Complexity of Data Reviewed Labs: ordered. Decision-making details documented in ED Course. Radiology: ordered and independent interpretation performed. Decision-making details documented in ED Course.  Risk Prescription drug management. Decision regarding hospitalization.    53 y.o. male presents with vomiting, diarrhea, and abdominal pain. No possible suspicious food intake.  Decreased oral tolerance. Rest of history as above.  Patient appears well, not in distress, and with no signs of toxicity or dehydration. Abdomen with generalized discomfort but not peritonitic.  Rest of the exam as above.  Differential diagnosis  considered.  CBC notable for leukocytosis and elevated hemoglobin partially hemoconcentrated. CMP without significant electrolyte derangements or renal sufficiency.  Bilirubin close to his baseline 1.4.  Mildly elevated alk phos.  Not overtly concerning for in situ biliary obstruction (since patient is status post cholecystectomy).  No pancreatitis.  UA without evidence of infection.  CT scan negative for any renal stones, no evidence of bowel obstruction or obvious changes concerning for serious intra-abdominal inflammatory/infectious processes.  Most consistent with viral gastroenteritis    Able to tolerate oral intake in the ED.  Discussed symptomatic treatment with the patient and they will follow closely with their PCP.     Final Clinical Impression(s) / ED Diagnoses Final diagnoses:  Generalized abdominal pain  Nausea vomiting and diarrhea   The patient appears reasonably screened and/or stabilized for discharge and I doubt any other medical condition or other [facility] requiring further screening, evaluation, or treatment in the ED at this time. I have discussed the findings, Dx and Tx plan with the patient/family who expressed understanding and agree(s) with the plan. Discharge instructions discussed at length. The patient/family was given strict return precautions who verbalized understanding of the instructions. No further questions at time of discharge.  Disposition: Discharge  Condition: Good  ED Discharge Orders          Ordered  ondansetron  (ZOFRAN -ODT) 4 MG disintegrating tablet  Every 8 hours PRN        04/06/24 0414    hyoscyamine (LEVSIN/SL) 0.125 MG SL tablet  4 times daily PRN        04/06/24 0414              Follow Up: Alexander Iba, [place] [address] [place] [address] [phone]  Call  to schedule an appointment for close follow up    This chart was dictated using voice recognition software.  Despite best efforts to proofread,   errors can occur which can change the documentation meaning.    Lindle Rhea, MD 04/06/24 [phone]

## 2024-04-06 NOTE — ED Notes (Signed)
Pt refuses pain meds at this time.

## 2024-04-07 ENCOUNTER — Other Ambulatory Visit (HOSPITAL_COMMUNITY): Payer: Self-pay

## 2024-04-10 ENCOUNTER — Other Ambulatory Visit: Payer: Self-pay

## 2024-04-21 ENCOUNTER — Other Ambulatory Visit (HOSPITAL_COMMUNITY): Payer: Self-pay

## 2024-04-28 ENCOUNTER — Ambulatory Visit (INDEPENDENT_AMBULATORY_CARE_PROVIDER_SITE_OTHER): Payer: Self-pay | Admitting: Audiology

## 2024-04-28 DIAGNOSIS — H903 Sensorineural hearing loss, bilateral: Secondary | ICD-10-CM

## 2024-04-28 NOTE — Progress Notes (Signed)
  [address], [address] [place], [place] [address] [phone]  Hearing Aid Check     [facility] Robertson comes to pick up his custom molds. His current ones are breaking and causing feedback.    Accompanied ab:[id number]  Patient is wearing a set of [employer] Exceed Play 1 BTE SP. His current molds are damaged and breaking.  Chief complaint: Patient reports the feel well.  Some minor adjustments to speech in noise processing were made to make it comfortable to the patient.   Actions taken: Inspection of the device and listening check showed that the mold fit well and the aids were working well.  Services fee: $172 ($30 service fee +$142 for the molds)  was paid at checkout.  Patient was oriented about returning to complete live speech mapping , given to time constraints I could not completed that today.  Recommend: Return for a hearing aid check , as soon as possible. Return for a hearing evaluation and to see an ENT, if concerns with hearing changes arise.    Jerryl Holzhauer MARIE LEROUX-MARTINEZ, AUD

## 2024-05-01 ENCOUNTER — Ambulatory Visit (INDEPENDENT_AMBULATORY_CARE_PROVIDER_SITE_OTHER): Admitting: Audiology

## 2024-05-05 ENCOUNTER — Ambulatory Visit (INDEPENDENT_AMBULATORY_CARE_PROVIDER_SITE_OTHER): Payer: Self-pay | Admitting: Audiology

## 2024-05-05 DIAGNOSIS — H903 Sensorineural hearing loss, bilateral: Secondary | ICD-10-CM
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
